# Patient Record
Sex: Male | Born: 1960 | Race: White | Hispanic: No | Marital: Married | Smoking: Current every day smoker
Health system: Southern US, Community
[De-identification: ages and names within clinical notes are randomized; demographics above are authoritative.]

## PROBLEM LIST (undated history)

## (undated) DIAGNOSIS — G4733 Obstructive sleep apnea (adult) (pediatric): Secondary | ICD-10-CM

## (undated) DIAGNOSIS — I10 Essential (primary) hypertension: Secondary | ICD-10-CM

## (undated) DIAGNOSIS — R42 Dizziness and giddiness: Secondary | ICD-10-CM

## (undated) DIAGNOSIS — G4730 Sleep apnea, unspecified: Secondary | ICD-10-CM

## (undated) DIAGNOSIS — R002 Palpitations: Secondary | ICD-10-CM

## (undated) DIAGNOSIS — E785 Hyperlipidemia, unspecified: Secondary | ICD-10-CM

## (undated) DIAGNOSIS — T7840XA Allergy, unspecified, initial encounter: Secondary | ICD-10-CM

## (undated) DIAGNOSIS — I493 Ventricular premature depolarization: Secondary | ICD-10-CM

## (undated) DIAGNOSIS — M1990 Unspecified osteoarthritis, unspecified site: Secondary | ICD-10-CM

## (undated) HISTORY — DX: Hyperlipidemia, unspecified: E78.5

## (undated) HISTORY — DX: Essential (primary) hypertension: I10

## (undated) HISTORY — DX: Sleep apnea, unspecified: G47.30

## (undated) HISTORY — DX: Dizziness and giddiness: R42

## (undated) HISTORY — DX: Ventricular premature depolarization: I49.3

## (undated) HISTORY — DX: Obstructive sleep apnea (adult) (pediatric): G47.33

## (undated) HISTORY — DX: Allergy, unspecified, initial encounter: T78.40XA

---

## 2003-04-01 HISTORY — PX: ELBOW SURGERY: SHX618

## 2005-03-31 HISTORY — PX: ROTATOR CUFF REPAIR: SHX139

## 2018-05-03 ENCOUNTER — Emergency Department (HOSPITAL_COMMUNITY)
Admission: EM | Admit: 2018-05-03 | Discharge: 2018-05-03 | Disposition: A | Discharge status: Home / Self Care | Payer: Managed Care, Other (non HMO) | Attending: Emergency Medicine | Admitting: Emergency Medicine

## 2018-05-03 ENCOUNTER — Other Ambulatory Visit: Payer: Self-pay

## 2018-05-03 ENCOUNTER — Emergency Department (HOSPITAL_COMMUNITY): Payer: Managed Care, Other (non HMO)

## 2018-05-03 ENCOUNTER — Encounter (HOSPITAL_COMMUNITY): Payer: Self-pay

## 2018-05-03 DIAGNOSIS — F17210 Nicotine dependence, cigarettes, uncomplicated: Secondary | ICD-10-CM | POA: Diagnosis not present

## 2018-05-03 DIAGNOSIS — R42 Dizziness and giddiness: Secondary | ICD-10-CM | POA: Diagnosis not present

## 2018-05-03 LAB — MAGNESIUM: Magnesium: 2.2 mg/dL (ref 1.7–2.4)

## 2018-05-03 LAB — COMPREHENSIVE METABOLIC PANEL
ALBUMIN: 4.6 g/dL (ref 3.5–5.0)
ALT: 39 U/L (ref 0–44)
AST: 35 U/L (ref 15–41)
Alkaline Phosphatase: 61 U/L (ref 38–126)
Anion gap: 13 (ref 5–15)
BUN: 16 mg/dL (ref 6–20)
CO2: 25 mmol/L (ref 22–32)
Calcium: 9.5 mg/dL (ref 8.9–10.3)
Chloride: 104 mmol/L (ref 98–111)
Creatinine, Ser: 1 mg/dL (ref 0.61–1.24)
GFR calc Af Amer: >60 mL/min (ref >60)
GFR calc non Af Amer: >60 mL/min (ref >60)
GLUCOSE: 144 mg/dL — AB (ref 70–99)
Potassium: 4.1 mmol/L (ref 3.5–5.1)
Sodium: 142 mmol/L (ref 135–145)
Total Bilirubin: 0.9 mg/dL (ref 0.3–1.2)
Total Protein: 7.6 g/dL (ref 6.5–8.1)

## 2018-05-03 LAB — ETHANOL: Alcohol, Ethyl (B): <10 mg/dL (ref <10)

## 2018-05-03 LAB — CBC WITH DIFFERENTIAL/PLATELET
Abs Immature Granulocytes: 0.02 10*3/uL (ref 0.00–0.07)
BASOS ABS: 0 10*3/uL (ref 0.0–0.1)
Basophils Relative: 1 %
Eosinophils Absolute: 0 10*3/uL (ref 0.0–0.5)
Eosinophils Relative: 1 %
HCT: 47.1 % (ref 39.0–52.0)
Hemoglobin: 15.3 g/dL (ref 13.0–17.0)
Immature Granulocytes: 1 %
Lymphocytes Relative: 16 %
Lymphs Abs: 0.6 10*3/uL — ABNORMAL LOW (ref 0.7–4.0)
MCH: 31.4 pg (ref 26.0–34.0)
MCHC: 32.5 g/dL (ref 30.0–36.0)
MCV: 96.7 fL (ref 80.0–100.0)
Monocytes Absolute: 0.5 10*3/uL (ref 0.1–1.0)
Monocytes Relative: 13 %
NEUTROS PCT: 68 %
Neutro Abs: 2.4 10*3/uL (ref 1.7–7.7)
Platelets: 170 10*3/uL (ref 150–400)
RBC: 4.87 MIL/uL (ref 4.22–5.81)
RDW: 12.5 % (ref 11.5–15.5)
WBC: 3.5 10*3/uL — AB (ref 4.0–10.5)
nRBC: 0 % (ref 0.0–0.2)

## 2018-05-03 LAB — I-STAT TROPONIN, ED
Troponin i, poc: 0.01 ng/mL (ref 0.00–0.08)
Troponin i, poc: 0 ng/mL (ref 0.00–0.08)

## 2018-05-03 MED ORDER — SODIUM CHLORIDE 0.9 % IV BOLUS
500.0000 mL | Freq: Once | INTRAVENOUS | Status: AC
  Administered 2018-05-03: 500 mL via INTRAVENOUS

## 2018-05-03 MED ORDER — SODIUM CHLORIDE 0.9 % IV BOLUS
1000.0000 mL | Freq: Once | INTRAVENOUS | Status: AC
  Administered 2018-05-03: 1000 mL via INTRAVENOUS

## 2018-05-03 NOTE — ED Notes (Signed)
Patient verbalizes understanding of discharge instructions. Opportunity for questioning and answers were provided. Armband removed by staff, pt discharged from ED.  

## 2018-05-03 NOTE — ED Notes (Signed)
Got patient undress into a gown on the monitor did ekg shown to Dr Francia Greaves patient is resting with call bell in reach

## 2018-05-03 NOTE — ED Provider Notes (Signed)
[facility] EMERGENCY DEPARTMENT Provider Note   CSN: [id number] Arrival date & time: 05/03/18  7106     History   Chief Complaint Chief Complaint  Patient presents with  . Near Syncope    HPI Bryce King is a 58 y.o. male.  58 year old male with prior medical history as detailed below presents for evaluation of lightheadedness and palpitations.  Patient reports onset of palpitations and lightheadedness after shoveling a lot of gravel yesterday morning.  Patient reports persistent symptoms over the course of yesterday and this morning.  He denies chest pain.  He does report one event yesterday where he thought he might pass out while he was sitting in a desk.  He has not actually had a syncopal event.  He denies associated shortness of breath or nausea.  He denies fever.  He denies known prior cardiac history.  Patient does have history of hypertension, hyperlipidemia, tobacco use, and ETOH consumption.  He currently does not have primary care.  He is not currently taking any medications.  The history is provided by the patient and medical records.  Palpitations  Palpitations quality:  Regular Onset quality:  Sudden Duration:  1 day Timing:  Constant Progression:  Waxing and waning Chronicity:  New Relieved by:  Nothing Worsened by:  Nothing Ineffective treatments:  None tried Associated symptoms: malaise/fatigue and near-syncope   Associated symptoms: no chest pain, no shortness of breath, no syncope and no vomiting     History reviewed. No pertinent past medical history.  There are no active problems to display for this patient.   History reviewed. No pertinent surgical history.      Home Medications    Prior to Admission medications   Not on File    Family History History reviewed. No pertinent family history.  Social History Social History   Tobacco Use  . Smoking status: Current Every Day Smoker    Packs/day: 1.00    Types:  Cigarettes  Substance Use Topics  . Alcohol use: Yes    Comment: 4 drinks per night 5 days a week  . Drug use: Never     Allergies   Patient has no known allergies.   Review of Systems Review of Systems  Constitutional: Positive for malaise/fatigue.  Respiratory: Negative for shortness of breath.   Cardiovascular: Positive for palpitations and near-syncope. Negative for chest pain and syncope.  Gastrointestinal: Negative for vomiting.  All other systems reviewed and are negative.    Physical Exam Updated Vital Signs BP (!) 179/93 (BP Location: Right Arm)   Pulse 82   Temp 97.9 F (36.6 C) (Oral)   Resp 14   Ht 6' (1.829 m)   Wt 104.3 kg   SpO2 100%   BMI 31.19 kg/m   Physical Exam Vitals signs and nursing note reviewed.  Constitutional:      General: He is not in acute distress.    Appearance: Normal appearance. He is well-developed.  HENT:     Head: Normocephalic and atraumatic.  Eyes:     Conjunctiva/sclera: Conjunctivae normal.     Pupils: Pupils are equal, round, and reactive to light.  Neck:     Musculoskeletal: Normal range of motion and neck supple.  Cardiovascular:     Rate and Rhythm: Normal rate and regular rhythm.     Heart sounds: Normal heart sounds.  Pulmonary:     Effort: Pulmonary effort is normal. No respiratory distress.     Breath sounds: Normal breath sounds.  Abdominal:     General: There is no distension.     Palpations: Abdomen is soft.     Tenderness: There is no abdominal tenderness.  Musculoskeletal: Normal range of motion.        General: No deformity.  Skin:    General: Skin is warm and dry.  Neurological:     Mental Status: He is alert and oriented to person, place, and time.      ED Treatments / Results  Labs (all labs ordered are listed, but only abnormal results are displayed) Labs Reviewed  CBC WITH DIFFERENTIAL/PLATELET - Abnormal; Notable for the following components:      Result Value   WBC 3.5 (*)    Lymphs  Abs 0.6 (*)    All other components within normal limits  COMPREHENSIVE METABOLIC PANEL - Abnormal; Notable for the following components:   Glucose, Bld 144 (*)    All other components within normal limits  ETHANOL  MAGNESIUM  I-STAT TROPONIN, ED  I-STAT TROPONIN, ED    EKG EKG Interpretation  Date/Time:  Monday May 03 2018 09:01:53 EST Ventricular Rate:  91 PR Interval:    QRS Duration: 102 QT Interval:  351 QTC Calculation: 432 R Axis:   -34 Text Interpretation:  Sinus rhythm Left axis deviation Confirmed by Dene Gentry [phone]) on 05/03/2018 9:05:21 AM   Radiology No results found.  Procedures Procedures (including critical care time)  Medications Ordered in ED Medications - No data to display   Initial Impression / Assessment and Plan / ED Course  I have reviewed the triage vital signs and the nursing notes.  Pertinent labs & imaging results that were available during my care of the patient were reviewed by me and considered in my medical decision making (see chart for details).     MDM  Screen complete  Patient is presenting for evaluation of reported lightheadedness and dizziness.  Patient's symptoms seem to be more orthostatic than not.  Patient without evidence of acute cardiac ischemia on EKG.  Troponin x2 is negative.  Other screening labs are without significant abnormality.  Patient does feel improved following IV fluids.  He desires discharge home.  Importance of close follow-up was stressed.  Strict return precautions given and understood.   Final Clinical Impressions(s) / ED Diagnoses   Final diagnoses:  [place]    ED Discharge Orders    None       Valarie Merino, MD 05/03/18 1435

## 2018-05-03 NOTE — Discharge Instructions (Addendum)
Return for any problem.  Follow-up with your regular care provider as instructed.  Plenty of fluids.

## 2018-05-03 NOTE — ED Notes (Signed)
Pt ambulated in hall to bathroom without assistance.

## 2018-05-03 NOTE — ED Triage Notes (Addendum)
Pt from home with complaint of lightheadedness, palpitations, heart racing, and 1 near syncopal episode since shoveling gravel yesterday morning. Denies chest pain. VSS.

## 2018-05-03 NOTE — ED Notes (Signed)
Walked to the bathroom patient did well

## 2018-05-05 ENCOUNTER — Encounter: Payer: Self-pay | Admitting: Family Medicine

## 2018-05-05 ENCOUNTER — Ambulatory Visit (INDEPENDENT_AMBULATORY_CARE_PROVIDER_SITE_OTHER): Payer: Managed Care, Other (non HMO) | Admitting: Family Medicine

## 2018-05-05 VITALS — BP 166/98 | HR 76 | Temp 98.2°F | Ht 72.0 in | Wt 227.0 lb

## 2018-05-05 DIAGNOSIS — Z23 Encounter for immunization: Secondary | ICD-10-CM

## 2018-05-05 DIAGNOSIS — R7309 Other abnormal glucose: Secondary | ICD-10-CM

## 2018-05-05 DIAGNOSIS — E785 Hyperlipidemia, unspecified: Secondary | ICD-10-CM | POA: Insufficient documentation

## 2018-05-05 DIAGNOSIS — I10 Essential (primary) hypertension: Secondary | ICD-10-CM

## 2018-05-05 DIAGNOSIS — F17200 Nicotine dependence, unspecified, uncomplicated: Secondary | ICD-10-CM | POA: Insufficient documentation

## 2018-05-05 DIAGNOSIS — F17210 Nicotine dependence, cigarettes, uncomplicated: Secondary | ICD-10-CM

## 2018-05-05 LAB — LIPID PANEL
Cholesterol: 244 mg/dL — ABNORMAL HIGH (ref 0–200)
HDL: 36.2 mg/dL — ABNORMAL LOW (ref >39.00)
LDL Cholesterol: 178 mg/dL — ABNORMAL HIGH (ref 0–99)
NonHDL: 207.54
Total CHOL/HDL Ratio: 7
Triglycerides: 148 mg/dL (ref 0.0–149.0)
VLDL: 29.6 mg/dL (ref 0.0–40.0)

## 2018-05-05 LAB — HEMOGLOBIN A1C: Hgb A1c MFr Bld: 5.7 % (ref 4.6–6.5)

## 2018-05-05 LAB — TSH: TSH: 1.61 u[IU]/mL (ref 0.35–4.50)

## 2018-05-05 MED ORDER — BUPROPION HCL ER (SR) 150 MG PO TB12: 150.0000 mg | ORAL_TABLET | Freq: Two times a day (BID) | ORAL | 0 refills | Status: DC

## 2018-05-05 MED ORDER — NICOTINE 14 MG/24HR TD PT24: 14.0000 mg | MEDICATED_PATCH | Freq: Every day | TRANSDERMAL | 0 refills | Status: DC

## 2018-05-05 MED ORDER — LISINOPRIL-HYDROCHLOROTHIAZIDE 20-12.5 MG PO TABS: 1.0000 | ORAL_TABLET | Freq: Every day | ORAL | 1 refills | Status: DC

## 2018-05-05 MED ORDER — MOMETASONE FUROATE 0.1 % EX CREA: 1.0000 "application " | TOPICAL_CREAM | Freq: Every day | CUTANEOUS | 0 refills | Status: DC

## 2018-05-05 MED ORDER — NICOTINE 7 MG/24HR TD PT24: 7.0000 mg | MEDICATED_PATCH | Freq: Every day | TRANSDERMAL | 0 refills | Status: DC

## 2018-05-05 MED ORDER — NICOTINE 21 MG/24HR TD PT24: 21.0000 mg | MEDICATED_PATCH | Freq: Every day | TRANSDERMAL | 0 refills | Status: DC

## 2018-05-05 NOTE — Assessment & Plan Note (Signed)
-  BP elevated today -Restart lisinopril-hctz at 20-12.5mg  initially. -Continue use of CPAP and discussed smoking cessation.

## 2018-05-05 NOTE — Patient Instructions (Addendum)
Great to meet you! Start bupropion  Nicotine patches: 21mg  x 1 week 14mg  x1 week 7mg  x2 weeks  We'll be in touch with lab results  See me again in 1 month.    Hypertension Hypertension, commonly called high blood pressure, is when the force of blood pumping through the arteries is too strong. The arteries are the blood vessels that carry blood from the heart throughout the body. Hypertension forces the heart to work harder to pump blood and may cause arteries to become narrow or stiff. Having untreated or uncontrolled hypertension can cause heart attacks, strokes, kidney disease, and other problems. A blood pressure reading consists of a higher number over a lower number. Ideally, your blood pressure should be below 120/80. The first ("top") number is called the systolic pressure. It is a measure of the pressure in your arteries as your heart beats. The second ("bottom") number is called the diastolic pressure. It is a measure of the pressure in your arteries as the heart relaxes. What are the causes? The cause of this condition is not known. What increases the risk? Some risk factors for high blood pressure are under your control. Others are not. Factors you can change  Smoking.  Having type 2 diabetes mellitus, high cholesterol, or both.  Not getting enough exercise or physical activity.  Being overweight.  Having too much fat, sugar, calories, or salt (sodium) in your diet.  Drinking too much alcohol. Factors that are difficult or impossible to change  Having chronic kidney disease.  Having a family history of high blood pressure.  Age. Risk increases with age.  Race. You may be at higher risk if you are African-American.  Gender. Men are at higher risk than women before age 78. After age 78, women are at higher risk than men.  Having obstructive sleep apnea.  Stress. What are the signs or symptoms? Extremely high blood pressure (hypertensive crisis) may  cause:  Headache.  Anxiety.  Shortness of breath.  Nosebleed.  Nausea and vomiting.  Severe chest pain.  Jerky movements you cannot control (seizures). How is this diagnosed? This condition is diagnosed by measuring your blood pressure while you are seated, with your arm resting on a surface. The cuff of the blood pressure monitor will be placed directly against the skin of your upper arm at the level of your heart. It should be measured at least twice using the same arm. Certain conditions can cause a difference in blood pressure between your right and left arms. Certain factors can cause blood pressure readings to be lower or higher than normal (elevated) for a short period of time:  When your blood pressure is higher when you are in a health care provider's office than when you are at home, this is called white coat hypertension. Most people with this condition do not need medicines.  When your blood pressure is higher at home than when you are in a health care provider's office, this is called masked hypertension. Most people with this condition may need medicines to control blood pressure. If you have a high blood pressure reading during one visit or you have normal blood pressure with other risk factors:  You may be asked to return on a different day to have your blood pressure checked again.  You may be asked to monitor your blood pressure at home for 1 week or longer. If you are diagnosed with hypertension, you may have other blood or imaging tests to help your health care  provider understand your overall risk for other conditions. How is this treated? This condition is treated by making healthy lifestyle changes, such as eating healthy foods, exercising more, and reducing your alcohol intake. Your health care provider may prescribe medicine if lifestyle changes are not enough to get your blood pressure under control, and if:  Your systolic blood pressure is above 130.  Your  diastolic blood pressure is above 80. Your personal target blood pressure may vary depending on your medical conditions, your age, and other factors. Follow these instructions at home: Eating and drinking   Eat a diet that is high in fiber and potassium, and low in sodium, added sugar, and fat. An example eating plan is called the DASH (Dietary Approaches to Stop Hypertension) diet. To eat this way: ? Eat plenty of fresh fruits and vegetables. Try to fill half of your plate at each meal with fruits and vegetables. ? Eat whole grains, such as whole wheat pasta, brown rice, or whole grain bread. Fill about one quarter of your plate with whole grains. ? Eat or drink low-fat dairy products, such as skim milk or low-fat yogurt. ? Avoid fatty cuts of meat, processed or cured meats, and poultry with skin. Fill about one quarter of your plate with lean proteins, such as fish, chicken without skin, beans, eggs, and tofu. ? Avoid premade and processed foods. These tend to be higher in sodium, added sugar, and fat.  Reduce your daily sodium intake. Most people with hypertension should eat less than 1,500 mg of sodium a day.  Limit alcohol intake to no more than 1 drink a day for nonpregnant women and 2 drinks a day for men. One drink equals 12 oz of beer, 5 oz of wine, or 1 oz of hard liquor. Lifestyle   Work with your health care provider to maintain a healthy body weight or to lose weight. Ask what an ideal weight is for you.  Get at least 30 minutes of exercise that causes your heart to beat faster (aerobic exercise) most days of the week. Activities may include walking, swimming, or biking.  Include exercise to strengthen your muscles (resistance exercise), such as pilates or lifting weights, as part of your weekly exercise routine. Try to do these types of exercises for 30 minutes at least 3 days a week.  Do not use any products that contain nicotine or tobacco, such as cigarettes and  e-cigarettes. If you need help quitting, ask your health care provider.  Monitor your blood pressure at home as told by your health care provider.  Keep all follow-up visits as told by your health care provider. This is important. Medicines  Take over-the-counter and prescription medicines only as told by your health care provider. Follow directions carefully. Blood pressure medicines must be taken as prescribed.  Do not skip doses of blood pressure medicine. Doing this puts you at risk for problems and can make the medicine less effective.  Ask your health care provider about side effects or reactions to medicines that you should watch for. Contact a health care provider if:  You think you are having a reaction to a medicine you are taking.  You have headaches that keep coming back (recurring).  You feel dizzy.  You have swelling in your ankles.  You have trouble with your vision. Get help right away if:  You develop a severe headache or confusion.  You have unusual weakness or numbness.  You feel faint.  You have severe pain  in your chest or abdomen.  You vomit repeatedly.  You have trouble breathing. Summary  Hypertension is when the force of blood pumping through your arteries is too strong. If this condition is not controlled, it may put you at risk for serious complications.  Your personal target blood pressure may vary depending on your medical conditions, your age, and other factors. For most people, a normal blood pressure is less than 120/80.  Hypertension is treated with lifestyle changes, medicines, or a combination of both. Lifestyle changes include weight loss, eating a healthy, low-sodium diet, exercising more, and limiting alcohol. This information is not intended to replace advice given to you by your health care provider. Make sure you discuss any questions you have with your health care provider. Document Released: 03/17/2005 Document Revised: 02/13/2016  Document Reviewed: 02/13/2016 Elsevier Interactive Patient Education  2019 [employer].

## 2018-05-05 NOTE — Progress Notes (Signed)
Bryce King - 58 y.o. male MRN [id number]  Date of birth: 1960/05/19  Subjective Chief Complaint  Patient presents with  . Annual Exam    Wants to discuss ongoing dizziness and headaches    HPI Amar Sippel is a 58 y.o. male with history of HTN, HLD, OSA and nicotine dependence here today for initial visit.  He was seen in ER recently for feeling of dizziness and pre-syncopal episode.  Had negative cardiac enzymes and no significant EKG findings.  Given fluid and felt better.  He reports that he feels ok at this time.   -HTN:  Previous treatment with lisinopril-hctz 40/25 qam and lisinopril 40mg  qpm.  He reports that he did well with this and denies side effects related to this medication.  He does also have OSA and reports compliance with CPAP.  He denies chest pain, shortness of breath, palpitations or headache.   -HLD:  He reports trying several statins in the past with myalgias and was eventually placed on Wellchol.  He did well with this and denies side effects from this medication.   -Nicotine dependence:  Currently smoking about 1ppd.  Was able to quit in the past with bupropion and patches from 2001-2016.  He would like to try these again and is motivated to quit.    ROS:  A comprehensive ROS was completed and negative except as noted per HPI  Allergies  Allergen Reactions  . Statins Other (See Comments)    Muscle aches    Past Medical History:  Diagnosis Date  . Hyperlipidemia   . Hypertension   . OSA (obstructive sleep apnea)    Uses CPAP    Past Surgical History:  Procedure Laterality Date  . ELBOW SURGERY Right 2005  . ROTATOR CUFF REPAIR Right 2007    Social History   Socioeconomic History  . Marital status: Married    Spouse name: Not on file  . Number of children: Not on file  . Years of education: Not on file  . Highest education level: Not on file  Occupational History  . Not on file  Social Needs  . Financial resource strain: Not on  file  . Food insecurity:    Worry: Not on file    Inability: Not on file  . Transportation needs:    Medical: Not on file    Non-medical: Not on file  Tobacco Use  . Smoking status: Current Every Day Smoker    Packs/day: 1.00    Types: Cigarettes  . Smokeless tobacco: Never Used  Substance and Sexual Activity  . Alcohol use: Yes    Comment: 4 drinks per night 5 days a week  . Drug use: Never  . Sexual activity: Not Currently  Lifestyle  . Physical activity:    Days per week: Not on file    Minutes per session: Not on file  . Stress: Not on file  Relationships  . Social connections:    Talks on phone: Not on file    Gets together: Not on file    Attends religious service: Not on file    Active member of club or organization: Not on file    Attends meetings of clubs or organizations: Not on file    Relationship status: Not on file  Other Topics Concern  . Not on file  Social History Narrative  . Not on file    Family History  Problem Relation Age of Onset  . Cancer Father   .  Depression Father   . Diabetes Father   . COPD Maternal Grandmother   . Heart attack Maternal Grandfather   . Heart attack Paternal Grandfather     Health Maintenance  Topic Date Due  . Hepatitis C Screening  May 23, 1960  . HIV Screening  07/06/1976  . TETANUS/TDAP  07/06/1980  . COLONOSCOPY  07/07/2011  . INFLUENZA VACCINE  10/29/2017    ----------------------------------------------------------------------------------------------------------------------------------------------------------------------------------------------------------------- Physical Exam BP (!) 166/98   Pulse 76   Temp 98.2 F (36.8 C) (Oral)   Ht 6' (1.829 m)   Wt 227 lb (103 kg)   SpO2 96%   BMI 30.79 kg/m   Physical Exam Constitutional:      Appearance: Normal appearance.  HENT:     Head: Normocephalic and atraumatic.     Mouth/Throat:     Mouth: Mucous membranes are moist.  Eyes:     General: No  scleral icterus. Neck:     Comments: No carotid bruit  Cardiovascular:     Rate and Rhythm: Normal rate and regular rhythm.  Pulmonary:     Effort: Pulmonary effort is normal.     Breath sounds: Normal breath sounds.  Skin:    General: Skin is warm and dry.  Neurological:     General: No focal deficit present.     Mental Status: He is alert.  Psychiatric:        Mood and Affect: Mood normal.        Behavior: Behavior normal.     ------------------------------------------------------------------------------------------------------------------------------------------------------------------------------------------------------------------- Assessment and Plan  Essential hypertension -BP elevated today -Restart lisinopril-hctz at 20-12.5mg  initially. -Continue use of CPAP and discussed smoking cessation.   Hyperlipidemia -Check lipid panel today.   Elevated glucose -Glucose on recent lab work elevated, check a1c today.   Nicotine dependence Counseling provided on smoking cessation today and discussed medication to help with this.  He is motivated to quit.  He had good success with bupropion and patches previously, will start this again.  Reassess at f/u in 4 weeks.   >10 minutes spent counseling patient regarding smoking cessation as outlined above.

## 2018-05-05 NOTE — Assessment & Plan Note (Signed)
-  Glucose on recent lab work elevated, check a1c today.

## 2018-05-05 NOTE — Assessment & Plan Note (Signed)
Check lipid panel today 

## 2018-05-05 NOTE — Assessment & Plan Note (Signed)
Counseling provided on smoking cessation today and discussed medication to help with this.  He is motivated to quit.  He had good success with bupropion and patches previously, will start this again.  Reassess at f/u in 4 weeks.   >10 minutes spent counseling patient regarding smoking cessation as outlined above.

## 2018-05-13 ENCOUNTER — Other Ambulatory Visit: Payer: Self-pay | Admitting: Family Medicine

## 2018-05-13 ENCOUNTER — Other Ambulatory Visit: Payer: Self-pay

## 2018-05-13 DIAGNOSIS — E785 Hyperlipidemia, unspecified: Secondary | ICD-10-CM

## 2018-05-13 MED ORDER — PRAVASTATIN SODIUM 40 MG PO TABS: 40.0000 mg | ORAL_TABLET | Freq: Every day | ORAL | 1 refills | Status: DC

## 2018-05-17 ENCOUNTER — Encounter (HOSPITAL_BASED_OUTPATIENT_CLINIC_OR_DEPARTMENT_OTHER): Payer: Self-pay

## 2018-05-17 ENCOUNTER — Observation Stay (HOSPITAL_BASED_OUTPATIENT_CLINIC_OR_DEPARTMENT_OTHER)
Admission: EM | Admit: 2018-05-17 | Discharge: 2018-05-18 | Disposition: A | Discharge status: Home / Self Care | Payer: Managed Care, Other (non HMO) | Attending: Internal Medicine | Admitting: Internal Medicine

## 2018-05-17 ENCOUNTER — Other Ambulatory Visit: Payer: Self-pay

## 2018-05-17 ENCOUNTER — Emergency Department (HOSPITAL_BASED_OUTPATIENT_CLINIC_OR_DEPARTMENT_OTHER): Payer: Managed Care, Other (non HMO)

## 2018-05-17 ENCOUNTER — Ambulatory Visit: Payer: Self-pay | Admitting: *Deleted

## 2018-05-17 DIAGNOSIS — R002 Palpitations: Secondary | ICD-10-CM | POA: Diagnosis present

## 2018-05-17 DIAGNOSIS — Z79899 Other long term (current) drug therapy: Secondary | ICD-10-CM | POA: Insufficient documentation

## 2018-05-17 DIAGNOSIS — Z8249 Family history of ischemic heart disease and other diseases of the circulatory system: Secondary | ICD-10-CM | POA: Diagnosis not present

## 2018-05-17 DIAGNOSIS — R072 Precordial pain: Secondary | ICD-10-CM | POA: Diagnosis not present

## 2018-05-17 DIAGNOSIS — I10 Essential (primary) hypertension: Secondary | ICD-10-CM | POA: Diagnosis not present

## 2018-05-17 DIAGNOSIS — G4733 Obstructive sleep apnea (adult) (pediatric): Secondary | ICD-10-CM | POA: Insufficient documentation

## 2018-05-17 DIAGNOSIS — R55 Syncope and collapse: Secondary | ICD-10-CM | POA: Insufficient documentation

## 2018-05-17 DIAGNOSIS — E785 Hyperlipidemia, unspecified: Secondary | ICD-10-CM | POA: Diagnosis not present

## 2018-05-17 DIAGNOSIS — R740 Nonspecific elevation of levels of transaminase and lactic acid dehydrogenase [LDH]: Secondary | ICD-10-CM | POA: Insufficient documentation

## 2018-05-17 DIAGNOSIS — Z888 Allergy status to other drugs, medicaments and biological substances status: Secondary | ICD-10-CM | POA: Insufficient documentation

## 2018-05-17 DIAGNOSIS — Z6831 Body mass index (BMI) 31.0-31.9, adult: Secondary | ICD-10-CM | POA: Diagnosis not present

## 2018-05-17 DIAGNOSIS — F17200 Nicotine dependence, unspecified, uncomplicated: Secondary | ICD-10-CM | POA: Diagnosis present

## 2018-05-17 DIAGNOSIS — I493 Ventricular premature depolarization: Secondary | ICD-10-CM | POA: Diagnosis present

## 2018-05-17 DIAGNOSIS — R739 Hyperglycemia, unspecified: Secondary | ICD-10-CM | POA: Insufficient documentation

## 2018-05-17 DIAGNOSIS — F17210 Nicotine dependence, cigarettes, uncomplicated: Secondary | ICD-10-CM | POA: Insufficient documentation

## 2018-05-17 DIAGNOSIS — R0789 Other chest pain: Secondary | ICD-10-CM | POA: Diagnosis not present

## 2018-05-17 DIAGNOSIS — F1010 Alcohol abuse, uncomplicated: Secondary | ICD-10-CM | POA: Insufficient documentation

## 2018-05-17 DIAGNOSIS — E669 Obesity, unspecified: Secondary | ICD-10-CM | POA: Insufficient documentation

## 2018-05-17 DIAGNOSIS — R079 Chest pain, unspecified: Secondary | ICD-10-CM | POA: Diagnosis present

## 2018-05-17 LAB — BASIC METABOLIC PANEL
Anion gap: 9 (ref 5–15)
BUN: 15 mg/dL (ref 6–20)
CO2: 26 mmol/L (ref 22–32)
Calcium: 9.6 mg/dL (ref 8.9–10.3)
Chloride: 103 mmol/L (ref 98–111)
Creatinine, Ser: 0.91 mg/dL (ref 0.61–1.24)
GFR calc Af Amer: >60 mL/min (ref >60)
GFR calc non Af Amer: >60 mL/min (ref >60)
Glucose, Bld: 107 mg/dL — ABNORMAL HIGH (ref 70–99)
Potassium: 4.2 mmol/L (ref 3.5–5.1)
Sodium: 138 mmol/L (ref 135–145)

## 2018-05-17 LAB — URINALYSIS, ROUTINE W REFLEX MICROSCOPIC
Bilirubin Urine: NEGATIVE
Glucose, UA: NEGATIVE mg/dL
Hgb urine dipstick: NEGATIVE
Ketones, ur: NEGATIVE mg/dL
Leukocytes,Ua: NEGATIVE
Nitrite: NEGATIVE
Protein, ur: NEGATIVE mg/dL
Specific Gravity, Urine: <1.005 — ABNORMAL LOW (ref 1.005–1.030)
pH: 6.5 (ref 5.0–8.0)

## 2018-05-17 LAB — CBC
HCT: 48.1 % (ref 39.0–52.0)
Hemoglobin: 16 g/dL (ref 13.0–17.0)
MCH: 31.9 pg (ref 26.0–34.0)
MCHC: 33.3 g/dL (ref 30.0–36.0)
MCV: 95.8 fL (ref 80.0–100.0)
Platelets: 217 10*3/uL (ref 150–400)
RBC: 5.02 MIL/uL (ref 4.22–5.81)
RDW: 11.9 % (ref 11.5–15.5)
WBC: 5.5 10*3/uL (ref 4.0–10.5)
nRBC: 0 % (ref 0.0–0.2)

## 2018-05-17 LAB — HEPATIC FUNCTION PANEL
ALT: 52 U/L — ABNORMAL HIGH (ref 0–44)
AST: 35 U/L (ref 15–41)
Albumin: 4.8 g/dL (ref 3.5–5.0)
Alkaline Phosphatase: 82 U/L (ref 38–126)
BILIRUBIN DIRECT: 0.1 mg/dL (ref 0.0–0.2)
Indirect Bilirubin: 0.6 mg/dL (ref 0.3–0.9)
Total Bilirubin: 0.7 mg/dL (ref 0.3–1.2)
Total Protein: 8.1 g/dL (ref 6.5–8.1)

## 2018-05-17 LAB — CBG MONITORING, ED: Glucose-Capillary: 94 mg/dL (ref 70–99)

## 2018-05-17 LAB — LIPASE, BLOOD: Lipase: 44 U/L (ref 11–51)

## 2018-05-17 LAB — TROPONIN I: Troponin I: <0.03 ng/mL (ref <0.03)

## 2018-05-17 MED ORDER — ENOXAPARIN SODIUM 40 MG/0.4ML ~~LOC~~ SOLN
40.0000 mg | SUBCUTANEOUS | Status: DC
  Filled 2018-05-17: qty 0.4

## 2018-05-17 MED ORDER — NITROGLYCERIN 0.4 MG SL SUBL
0.4000 mg | SUBLINGUAL_TABLET | SUBLINGUAL | Status: DC | PRN
  Filled 2018-05-17: qty 1

## 2018-05-17 MED ORDER — BUPROPION HCL ER (SR) 150 MG PO TB12
150.0000 mg | ORAL_TABLET | Freq: Two times a day (BID) | ORAL | Status: DC
  Administered 2018-05-17 – 2018-05-18 (×2): 150 mg via ORAL
  Filled 2018-05-17 (×3): qty 1

## 2018-05-17 MED ORDER — ASPIRIN 325 MG PO TABS
325.0000 mg | ORAL_TABLET | Freq: Once | ORAL | Status: AC
  Administered 2018-05-17: 325 mg via ORAL
  Filled 2018-05-17: qty 1

## 2018-05-17 MED ORDER — ACETAMINOPHEN 325 MG PO TABS: 650.0000 mg | ORAL_TABLET | ORAL | Status: DC | PRN

## 2018-05-17 MED ORDER — NICOTINE 21 MG/24HR TD PT24
21.0000 mg | MEDICATED_PATCH | Freq: Every day | TRANSDERMAL | Status: DC
  Administered 2018-05-18: 21 mg via TRANSDERMAL
  Filled 2018-05-17: qty 1

## 2018-05-17 MED ORDER — PANTOPRAZOLE SODIUM 40 MG IV SOLR
40.0000 mg | Freq: Once | INTRAVENOUS | Status: AC
  Administered 2018-05-17: 40 mg via INTRAVENOUS
  Filled 2018-05-17: qty 40

## 2018-05-17 MED ORDER — PRAVASTATIN SODIUM 40 MG PO TABS
40.0000 mg | ORAL_TABLET | Freq: Every day | ORAL | Status: DC
  Filled 2018-05-17: qty 1

## 2018-05-17 MED ORDER — ONDANSETRON HCL 4 MG/2ML IJ SOLN: 4.0000 mg | Freq: Four times a day (QID) | INTRAMUSCULAR | Status: DC | PRN

## 2018-05-17 MED ORDER — MORPHINE SULFATE (PF) 4 MG/ML IV SOLN
4.0000 mg | Freq: Once | INTRAVENOUS | Status: AC
  Administered 2018-05-17: 4 mg via INTRAVENOUS
  Filled 2018-05-17: qty 1

## 2018-05-17 NOTE — H&P (Signed)
History and Physical   Bryce King FWY:[id number] DOB: 05-Dec-1960 DOA: 05/17/2018  PCP: Luetta Nutting, DO  Chief Complaint: chest pain and dizziness  HPI: This is a 58 year old man with medical problems including hypertension, hyperlipidemia, former smoker, OSA who presents with recent development of chest pain and lightheadedness.  History is obtained via chart review, as well as discussion with the patient at the bedside.  Patient reports he has been statin intolerant in the past, but was successfully initiated on pravastatin about 1 week ago.  In the past he has had myalgias with other statins.  He also reports being started on ACE inhibitor as well as hydrochlorothiazide for his hypertension.  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  He reports quitting smoking 1 week prior to admission, currently on nicotine replacement as well as adjunctive bupropion.  He reports being active outdoors doing yard work.  He was at his desk when he developed sudden onset of lightheadedness, palpitations, diaphoresis as well as chest discomfort, this first episode occurred at about 2:30 PM.  Lasted for a few seconds before going away.  He reports there was a positional component.  A second episode developed about 15 minutes later, this lasted for 30 to 40 minutes.  This is what prompted him to call his primary care physician who recommended seeking medical attention in a higher acuity setting.  Patient reports daily alcohol intake, about 3-5 drinks of whiskey daily.  However, he reports he has never had withdrawal symptoms and he has cut back on his alcohol recently.  He denies any fevers, chills, syncope, seizures, hemoptysis, productive cough, dysuria.  The patient reports having similar symptoms in early February where he was evaluated in the emergency department, he reports receiving IV fluids and discharged home.  Has never undergone a cardiac stress test.  ED Course: In the  emergency department department vital signs remarkable for heart rate of 98, systolic blood pressure 277/41.  CBC unremarkable, CMP with mild hyperglycemia with a glucose of 107, mild ALT elevation of 52.  Troponin evaluated and was undetectable at first check at 1811.  The patient was given morphine as well as a PPI.  EKG revealed normal sinus rhythm, personally reviewed no overt ST segment elevations.  A chest x-ray was performed which did not reveal acute abnormality of the lungs.  Cardiology was consulted in the emergency department who recommended transfer to [facility] further work-up.  Hospital medicine was consulted for further management.    Review of Systems: A complete ROS was obtained; pertinent positives negatives are denoted in the HPI. Otherwise, all systems are negative.   Past Medical History:  Diagnosis Date  . Hyperlipidemia   . Hypertension   . OSA (obstructive sleep apnea)    Uses CPAP   Social History   Socioeconomic History  . Marital status: Married    Spouse name: Not on file  . Number of children: Not on file  . Years of education: Not on file  . Highest education level: Not on file  Occupational History  . Not on file  Social Needs  . Financial resource strain: Not on file  . Food insecurity:    Worry: Not on file    Inability: Not on file  . Transportation needs:    Medical: Not on file    Non-medical: Not on file  Tobacco Use  . Smoking status: Current Every Day Smoker    Packs/day: 1.00    Types: Cigarettes  .  Smokeless tobacco: Never Used  . Tobacco comment: quit 05/11/2018  Substance and Sexual Activity  . Alcohol use: Yes    Comment: weekly  . Drug use: Never  . Sexual activity: Not on file  Lifestyle  . Physical activity:    Days per week: Not on file    Minutes per session: Not on file  . Stress: Not on file  Relationships  . Social connections:    Talks on phone: Not on file    Gets together: Not on file    Attends religious service:  Not on file    Active member of club or organization: Not on file    Attends meetings of clubs or organizations: Not on file    Relationship status: Not on file  . Intimate partner violence:    Fear of current or ex partner: Not on file    Emotionally abused: Not on file    Physically abused: Not on file    Forced sexual activity: Not on file  Other Topics Concern  . Not on file  Social History Narrative  . Not on file   Family History  Problem Relation Age of Onset  . Cancer Father   . Depression Father   . Diabetes Father   . COPD Maternal Grandmother   . Heart attack Maternal Grandfather   . Heart attack Paternal Grandfather     Physical Exam: Vitals:   05/17/18 1640 05/17/18 1642 05/17/18 2022 05/17/18 2139  BP: (!) 140/91 124/83 (!) 145/92 (!) 161/93  Pulse: 84 89 74 65  Resp: (!) 23 14 13 15   Temp:    98 F (36.7 C)  TempSrc:    Oral  SpO2: 99% 97% 98% 98%  Weight:    100.3 kg  Height:    6' (1.829 m)   General: Appears calm and comfortable, well-appearing overweight/obese white man ENT: Grossly normal hearing, MMM. Cardiovascular: RRR. No M/R/G. No LE edema.  Respiratory: CTA bilaterally. No wheezes or crackles. Normal respiratory effort.  Breathing room air Abdomen: Soft, non-tender.  Mildly distended.  No guarding or rebound Skin: No rash or induration seen on limited exam.  Tattoos present. Musculoskeletal: Grossly normal tone BUE/BLE. Appropriate ROM.  Sits up without difficulty Psychiatric: Grossly normal mood and affect. Neurologic: Moves all extremities in coordinated fashion.  I have personally reviewed the following labs, culture data, and imaging studies.  Assessment/Plan:  #Chest pain Course: Occurred x 2 episodes on day of admission, the 2nd episode lasted 30-40 minutes; associated symptoms of diaphoresis, palpitations. Initial EKG with NSR and initial cardiac biomarker WNL. A/P: has cardiac risk factors with hyperlipidemia, recent smoking  cessation (within last week), HTN; differential includes cardiac ischemia vs other etiologies - however, doubt VTE given normal HR and RR, other considerations include other pulmonary, MSK, GI, etc etiologies.  Will provide ASA 324 mg as benefits outweigh burdens / risks. NPO after MN in event cardiac stress test indicated.  Will repeat troponin to ensure stability.  Continue statin. Consider TTE in AM. Telemetry.  #Other problems: -HTN: holding ACEi / HCTZ until re-assessment in AM pending clinical stability -Smoking, recent quitting: continue nicotine patch and adjunctive bupropion -OSA: CPAP qhs -ETOH use: 3-5 whiskey beverages on average daily, no hx of withdrawal -Mild ALT elevation: repeat in AM, if consistently elevated, consider additional w/u.  DVT prophylaxis: Subq Lovenox Code Status: full Disposition Plan: Anticipate D/C home as early as 05/18/2018 depending on clinical course Consults called:  Cardiology consulted by emergency medicine  team; however, would recommend touching base with their team in the AM to evaluate optimal ischemic evaluation Admission status: admit to hospital medicine service   Cheri Rous, MD [facility] [phone]  If 7PM-7AM, please contact night-coverage [email or web address] Password [email or web address]  This document was created using the aid of voice recognition / dication software.

## 2018-05-17 NOTE — ED Provider Notes (Signed)
[facility] EMERGENCY DEPARTMENT Provider Note   CSN: [id number] Arrival date & time: 05/17/18  1554    History   Chief Complaint Chief Complaint  Patient presents with  . Dizziness  . Near Syncope    HPI Bryce King is a 58 y.o. male.     HPI Patient has had episodes of developing left-sided chest pain.  He reports he starts to feel like his chest is expanding and full which is then subsequently followed by heart racing and feeling like he is getting close to passing out.  He did have an episode similar to this when he was seen in the emergency department about a week ago.  At that time it occurred a couple hours after he had been doing some pretty heavy physical labor.  These last 2 episodes have occurred at rest.  He reports however the pain becomes quite intense and slowly starts to resolve.  Denies has been sick with fever chills or cough.  He has not had abdominal pain or lower extremity swelling.  Never prior history of similar symptoms.  Family history for 2 grandparents with heart attack in their 72s. Past Medical History:  Diagnosis Date  . Hyperlipidemia   . Hypertension   . OSA (obstructive sleep apnea)    Uses CPAP    Patient Active Problem List   Diagnosis Date Noted  . Chest pressure 05/17/2018  . Essential hypertension 05/05/2018  . Hyperlipidemia 05/05/2018  . Elevated glucose 05/05/2018  . Nicotine dependence 05/05/2018    Past Surgical History:  Procedure Laterality Date  . ELBOW SURGERY Right 2005  . ROTATOR CUFF REPAIR Right 2007        Home Medications    Prior to Admission medications   Medication Sig Start Date End Date Taking? Authorizing Provider  buPROPion (WELLBUTRIN SR) 150 MG 12 hr tablet Take 1 tablet (150 mg total) by mouth 2 (two) times daily. Start 150mg  daily x3 days then increase to BID 05/05/18  Yes Luetta Nutting, DO  lisinopril-hydrochlorothiazide (ZESTORETIC) 20-12.5 MG tablet Take 1 tablet by mouth daily.  05/05/18  Yes Luetta Nutting, DO  Melatonin 3 MG TABS Take 3 mg by mouth at bedtime.   Yes [provider]  mometasone (ELOCON) 0.1 % cream Apply 1 application topically daily. 05/05/18  Yes Luetta Nutting, DO  nicotine (NICODERM CQ) 21 mg/24hr patch Place 1 patch (21 mg total) onto the skin daily. 05/05/18  Yes Luetta Nutting, DO  pravastatin (PRAVACHOL) 40 MG tablet Take 1 tablet (40 mg total) by mouth daily. 05/13/18  Yes Luetta Nutting, DO  Multiple Vitamin (MULTIVITAMIN WITH MINERALS) TABS tablet Take 1 tablet by mouth daily.    [provider]  nicotine (NICODERM CQ) 14 mg/24hr patch Place 1 patch (14 mg total) onto the skin daily. 05/05/18   Luetta Nutting, DO  nicotine (NICODERM CQ) 7 mg/24hr patch Place 1 patch (7 mg total) onto the skin daily. 05/05/18   Luetta Nutting, DO    Family History Family History  Problem Relation Age of Onset  . Cancer Father   . Depression Father   . Diabetes Father   . COPD Maternal Grandmother   . Heart attack Maternal Grandfather   . Heart attack Paternal Grandfather     Social History Social History   Tobacco Use  . Smoking status: Current Every Day Smoker    Packs/day: 1.00    Types: Cigarettes  . Smokeless tobacco: Never Used  . Tobacco comment: quit 05/11/2018  Substance Use Topics  . Alcohol use: Yes    Comment: weekly  . Drug use: Never     Allergies   Statins   Review of Systems Review of Systems 10 Systems reviewed and are negative for acute change except as noted in the HPI.   Physical Exam Updated Vital Signs BP 124/83   Pulse 89   Temp 97.8 F (36.6 C) (Oral)   Resp 14   Ht 6' (1.829 m)   Wt 104.3 kg   SpO2 97%   BMI 31.19 kg/m   Physical Exam Constitutional:      Appearance: He is well-developed.  HENT:     Head: Normocephalic and atraumatic.     Mouth/Throat:     Mouth: Mucous membranes are moist.     Pharynx: Oropharynx is clear.  Eyes:     Pupils: Pupils are equal, round, and reactive to  light.  Neck:     Musculoskeletal: Neck supple.  Cardiovascular:     Rate and Rhythm: Normal rate and regular rhythm.     Heart sounds: Normal heart sounds.  Pulmonary:     Effort: Pulmonary effort is normal.     Breath sounds: Normal breath sounds.  Abdominal:     General: Bowel sounds are normal. There is no distension.     Palpations: Abdomen is soft.     Tenderness: There is no abdominal tenderness.  Musculoskeletal: Normal range of motion.        General: No swelling or tenderness.  Skin:    General: Skin is warm and dry.  Neurological:     Mental Status: He is alert and oriented to person, place, and time.     GCS: GCS eye subscore is 4. GCS verbal subscore is 5. GCS motor subscore is 6.     Coordination: Coordination normal.      ED Treatments / Results  Labs (all labs ordered are listed, but only abnormal results are displayed) Labs Reviewed  BASIC METABOLIC PANEL - Abnormal; Notable for the following components:      Result Value   Glucose, Bld 107 (*)    All other components within normal limits  URINALYSIS, ROUTINE W REFLEX MICROSCOPIC - Abnormal; Notable for the following components:   Specific Gravity, Urine <1.005 (*)    All other components within normal limits  HEPATIC FUNCTION PANEL - Abnormal; Notable for the following components:   ALT 52 (*)    All other components within normal limits  CBC  LIPASE, BLOOD  TROPONIN I  CBG MONITORING, ED    EKG EKG Interpretation  Date/Time:  Monday May 17 2018 16:03:55 EST Ventricular Rate:  87 PR Interval:  152 QRS Duration: 96 QT Interval:  366 QTC Calculation: 440 R Axis:   82 Text Interpretation:  Normal sinus rhythm Normal ECG ok. no change Confirmed by Charlesetta Shanks [phone]) on 05/17/2018 4:56:05 PM   Radiology Dg Chest 2 View  Result Date: 05/17/2018 CLINICAL DATA:  Chest pain, near syncope EXAM: CHEST - 2 VIEW COMPARISON:  None. FINDINGS: The heart size and mediastinal contours are within  normal limits. Both lungs are clear. The visualized skeletal structures are unremarkable. IMPRESSION: No acute abnormality of the lungs.  No focal airspace opacity. Electronically Signed   By: Eddie Candle M.D.   On: 05/17/2018 16:31    Procedures Procedures (including critical care time)  Medications Ordered in ED Medications  pantoprazole (PROTONIX) injection 40 mg (40 mg Intravenous Given 05/17/18 1851)  morphine 4  MG/ML injection 4 mg (4 mg Intravenous Given 05/17/18 1851)     Initial Impression / Assessment and Plan / ED Course  I have reviewed the triage vital signs and the nursing notes.  Pertinent labs & imaging results that were available during my care of the patient were reviewed by me and considered in my medical decision making (see chart for details).  Clinical Course as of May 17 1934  Mon May 17, 2018  [id number] Consult: Reviewed with Dr. Florence Canner cardiology, advises for hospitalist admission and consult if needed.  He has reviewed first EKG and no concerning findings on EKG at this time.   [MP]    Clinical Course User Index [MP] Charlesetta Shanks, MD  Consult: Reviewed to Dr. Donna Bernard tried hospitalist for admission.    Patient presents with several recurrent episodes of chest fullness and near syncope.  He also describes feeling like his heart is racing and beating very hard.  This time, with recurrence of episodes will plan for admission and monitoring for potential dysrhythmia versus ischemic disease.  Patient at this time is not showing signs of infectious etiology.  He does have coronary artery disease risk factors.  Will plan for admission.  Final Clinical Impressions(s) / ED Diagnoses   Final diagnoses:  Near syncope  Precordial pain  Palpitation    ED Discharge Orders    None       Charlesetta Shanks, MD 05/17/18 1941

## 2018-05-17 NOTE — ED Notes (Signed)
[facility] notified (Tara) - patient ready for transport 

## 2018-05-17 NOTE — ED Notes (Signed)
ED Provider at bedside. 

## 2018-05-17 NOTE — Telephone Encounter (Signed)
Pt called with experiencing some dizziness that has been going on for the last couple of weeks. The dizziness does not last. He went to the ED on the 3rd and saw his provider on the 5th and was prescribed b/p and cholesterol med per pt. He describes these 2 episodes today as getting tunnel vision and being light headed. They lasted about 2 ot 3 seconds. He feels hot and clammy afterwards. which he states he" does not feel normal". His b/p this morning was 138/82 and not able to check it now. He is not dehydrated, drinking plenty of water. He denies chest pain, shortness of breath or weakness.  At night he states his arms go to sleep. When he is on his right side that arm goes to sleep and turning over to the left side that arms goes to sleep. He stated that this wakes him up. He has a follow up appointment scheduled in March with his provider. Recommended he goes to an urgent care to be assessed and get his vital signs check.  Pt voiced understanding and his wife will drive him. Routing to [facility] at [place] over [employer].  Reason for Disposition . [1] MODERATE dizziness (e.g., interferes with normal activities) AND [2] has been evaluated by physician for this  Answer Assessment - Initial Assessment Questions 1. DESCRIPTION: "Describe your dizziness."     lightheaded 2. LIGHTHEADED: "Do you feel lightheaded?" (e.g., somewhat faint, woozy, weak upon standing)     Not now 3. VERTIGO: "Do you feel like either you or the room is spinning or tilting?" (i.e. vertigo)     no 4. SEVERITY: "How bad is it?"  "Do you feel like you are going to faint?" "Can you stand and walk?"   - MILD - walking normally   - MODERATE - interferes with normal activities (e.g., work, school)    - SEVERE - unable to stand, requires support to walk, feels like passing out now.      mild 5. ONSET:  "When did the dizziness begin?"    Over the last 2 weeks. 6. AGGRAVATING FACTORS: "Does anything make it worse?" (e.g., standing,  change in head position)     Not sure 7. HEART RATE: "Can you tell me your heart rate?" "How many beats in 15 seconds?"  (Note: not all patients can do this)       Not able to take 8. CAUSE: "What do you think is causing the dizziness?"     Possibly b/p 9. RECURRENT SYMPTOM: "Have you had dizziness before?" If so, ask: "When was the last time?" "What happened that time?"     Yes, saw the provider and went to ED 10. OTHER SYMPTOMS: "Do you have any other symptoms?" (e.g., fever, chest pain, vomiting, diarrhea, bleeding)       no 11. PREGNANCY: "Is there any chance you are pregnant?" "When was your last menstrual period?"       N/a  Protocols used: DIZZINESS [facility]

## 2018-05-17 NOTE — Care Management (Signed)
This is a no charge note  Transfer from [facility] per Dr. Johnney Killian  58 year old male with a past medical history of hypertension, hyperlipidemia, depression, OSA, tobacco abuse, who presents with chest compression and heart racing  Patient has had 2 episode of chest pressure and heart racing (one episode yesterday and another episode today).  The symptoms is associated with lightheadedness and near syncope.  Patient was found to have negative troponin, electrolytes renal function okay, WBC 5.5, temperature normal, heart rate in 90s, oxygen saturation 97% on room air.  EKG showed QTC 440 with borderline RAD and no obvious ischemic change.  Patient is placed on telemetry bed for observation.  Per ED physician, cardiologist, Dr. Johnsie Cancel reviewed EKG, without any significant concerns.    Please call manager of [facility] at [phone] when pt arrives to floor   Ivor Costa, MD  [facility]   If 7PM-7AM, please contact night-coverage [email or web address] Password [facility] 05/17/2018, 7:35 PM

## 2018-05-17 NOTE — Progress Notes (Signed)
Dr. Mora Bellman paged and made aware patient has arrived to unit via [facility].

## 2018-05-17 NOTE — ED Triage Notes (Signed)
C/o lightheaded and near syncope today ~1 hour PTA-same c/o ~2 weeks ago-was started on BP meds with new PCP-was advised by PCP office to go to [place] advised to go to ED-to triage in w/c-NAD

## 2018-05-18 ENCOUNTER — Other Ambulatory Visit: Payer: Self-pay | Admitting: Cardiology

## 2018-05-18 ENCOUNTER — Observation Stay (HOSPITAL_BASED_OUTPATIENT_CLINIC_OR_DEPARTMENT_OTHER): Payer: Managed Care, Other (non HMO)

## 2018-05-18 ENCOUNTER — Other Ambulatory Visit: Payer: Self-pay | Admitting: Student

## 2018-05-18 ENCOUNTER — Observation Stay (HOSPITAL_COMMUNITY): Payer: Managed Care, Other (non HMO)

## 2018-05-18 DIAGNOSIS — F17210 Nicotine dependence, cigarettes, uncomplicated: Secondary | ICD-10-CM

## 2018-05-18 DIAGNOSIS — R002 Palpitations: Secondary | ICD-10-CM

## 2018-05-18 DIAGNOSIS — I10 Essential (primary) hypertension: Secondary | ICD-10-CM | POA: Diagnosis not present

## 2018-05-18 DIAGNOSIS — R079 Chest pain, unspecified: Secondary | ICD-10-CM

## 2018-05-18 DIAGNOSIS — R0789 Other chest pain: Secondary | ICD-10-CM | POA: Diagnosis not present

## 2018-05-18 DIAGNOSIS — R072 Precordial pain: Principal | ICD-10-CM

## 2018-05-18 DIAGNOSIS — E785 Hyperlipidemia, unspecified: Secondary | ICD-10-CM

## 2018-05-18 DIAGNOSIS — R55 Syncope and collapse: Secondary | ICD-10-CM | POA: Diagnosis not present

## 2018-05-18 DIAGNOSIS — I493 Ventricular premature depolarization: Secondary | ICD-10-CM

## 2018-05-18 LAB — COMPREHENSIVE METABOLIC PANEL
ALT: 46 U/L — ABNORMAL HIGH (ref 0–44)
AST: 26 U/L (ref 15–41)
Albumin: 4 g/dL (ref 3.5–5.0)
Alkaline Phosphatase: 54 U/L (ref 38–126)
Anion gap: 9 (ref 5–15)
BUN: 13 mg/dL (ref 6–20)
CO2: 25 mmol/L (ref 22–32)
CREATININE: 1.03 mg/dL (ref 0.61–1.24)
Calcium: 9 mg/dL (ref 8.9–10.3)
Chloride: 102 mmol/L (ref 98–111)
Glucose, Bld: 100 mg/dL — ABNORMAL HIGH (ref 70–99)
Potassium: 3.7 mmol/L (ref 3.5–5.1)
Sodium: 136 mmol/L (ref 135–145)
Total Bilirubin: 0.8 mg/dL (ref 0.3–1.2)
Total Protein: 6.6 g/dL (ref 6.5–8.1)

## 2018-05-18 LAB — ECHOCARDIOGRAM COMPLETE
Height: 72 in
Weight: 3537.94 oz

## 2018-05-18 LAB — HIV ANTIBODY (ROUTINE TESTING W REFLEX): HIV Screen 4th Generation wRfx: NONREACTIVE

## 2018-05-18 LAB — CBC
HCT: 43.9 % (ref 39.0–52.0)
Hemoglobin: 14.7 g/dL (ref 13.0–17.0)
MCH: 31.5 pg (ref 26.0–34.0)
MCHC: 33.5 g/dL (ref 30.0–36.0)
MCV: 94.2 fL (ref 80.0–100.0)
Platelets: 183 10*3/uL (ref 150–400)
RBC: 4.66 MIL/uL (ref 4.22–5.81)
RDW: 12.1 % (ref 11.5–15.5)
WBC: 5 10*3/uL (ref 4.0–10.5)
nRBC: 0 % (ref 0.0–0.2)

## 2018-05-18 LAB — BRAIN NATRIURETIC PEPTIDE: B Natriuretic Peptide: 7.3 pg/mL (ref 0.0–100.0)

## 2018-05-18 LAB — TSH: TSH: 2.183 u[IU]/mL (ref 0.350–4.500)

## 2018-05-18 LAB — TROPONIN I: Troponin I: <0.03 ng/mL (ref <0.03)

## 2018-05-18 MED ORDER — ADULT MULTIVITAMIN W/MINERALS CH
1.0000 | ORAL_TABLET | Freq: Every day | ORAL | Status: DC
  Administered 2018-05-18: 1 via ORAL
  Filled 2018-05-18: qty 1

## 2018-05-18 MED ORDER — FOLIC ACID 1 MG PO TABS
1.0000 mg | ORAL_TABLET | Freq: Every day | ORAL | Status: DC
  Administered 2018-05-18: 1 mg via ORAL
  Filled 2018-05-18: qty 1

## 2018-05-18 MED ORDER — VITAMIN B-1 100 MG PO TABS
100.0000 mg | ORAL_TABLET | Freq: Every day | ORAL | Status: DC
  Administered 2018-05-18: 100 mg via ORAL
  Filled 2018-05-18: qty 1

## 2018-05-18 MED ORDER — LORAZEPAM 2 MG/ML IJ SOLN: 0.0000 mg | Freq: Four times a day (QID) | INTRAMUSCULAR | Status: DC

## 2018-05-18 MED ORDER — LORAZEPAM 2 MG/ML IJ SOLN: 1.0000 mg | Freq: Four times a day (QID) | INTRAMUSCULAR | Status: DC | PRN

## 2018-05-18 MED ORDER — LORAZEPAM 1 MG PO TABS: 1.0000 mg | ORAL_TABLET | Freq: Four times a day (QID) | ORAL | Status: DC | PRN

## 2018-05-18 MED ORDER — LORAZEPAM 2 MG/ML IJ SOLN: 0.0000 mg | Freq: Two times a day (BID) | INTRAMUSCULAR | Status: DC

## 2018-05-18 MED ORDER — THIAMINE HCL 100 MG/ML IJ SOLN: 100.0000 mg | Freq: Every day | INTRAMUSCULAR | Status: DC

## 2018-05-18 NOTE — Progress Notes (Signed)
Pt is irritated and states he's ready to be discharged. RN checked and Echo results are not available yet. RN informed pt of this. MD made aware.

## 2018-05-18 NOTE — Plan of Care (Signed)
  Problem: Education: Goal: Knowledge of General Education information will improve Description: Including pain rating scale, medication(s)/side effects and non-pharmacologic comfort measures Outcome: Progressing   Problem: Health Behavior/Discharge Planning: Goal: Ability to manage health-related needs will improve Outcome: Progressing   Problem: Clinical Measurements: Goal: Ability to maintain clinical measurements within normal limits will improve Outcome: Progressing Goal: Will remain free from infection Outcome: Progressing   Problem: Activity: Goal: Risk for activity intolerance will decrease Outcome: Progressing   Problem: Coping: Goal: Level of anxiety will decrease Outcome: Progressing   

## 2018-05-18 NOTE — Progress Notes (Signed)
Ordering outpatient exercise tolerance test for further evaluation of chest pain.

## 2018-05-18 NOTE — Consult Note (Addendum)
Cardiology Consult    Patient ID: Bryce King MRN: [id number], DOB/AGE: 28-Sep-1960   Admit date: 05/17/2018 Date of Consult: 05/18/2018  Primary Physician: Luetta Nutting, DO Primary Cardiologist: New to [facility].  Requesting Provider: Fuller Plan, MD  Patient Profile    Bryce King is a 58 y.o. male with a history of hypertension, hyperlipidemia, obstructive sleep apnea on CPAP, and tobacco use (quit 6 days ago) but no known cardiac disease, who is being seen today for the evaluation of chest pain at the request of Dr. Tamala Julian.  History of Present Illness    Bryce King is a 58 year old male with a history of hypertension, hyperlipidemia, obstructive sleep apnea on CPAP, and former tobacco use but no known cardiac disease. He has never had an ischemic work-up before. He presented to the [facility] ED yesterday for evaluation of chest pain pain and near syncope.   Patient has been in his usual states of health until about 2 weeks ago when he had his first episode of lightheadedness and palpitations. Patient states he was sitting at his desk on 05/02/2018 after working his is yard for a couple of hours and developed sudden onset of lightheadedness and tunnel vision and felt like he was going to pass out. Patient states he stood up and the symptoms resolved. The next day he was driving in his car and he states he felt like he was "fighting" to keep himself from passing out. Patient presented to the ED at that time. Symptoms were felt to be most consistent with orthostatic at that time. Patient was given IV fluids and discharged in stable condition. Patient had follow-up with PCP and was started on medications for hypertension and hyperlipidemia. Patient felt like he was doing well until yesterday when he had another episode of lightheadedness. Patient states he was sitting at his desk around 2:30pm yesterday when all of a sudden he felt lightheaded. He sat up straight and the  symptoms resolved. However, about 15 minutes later, he again started to feel lightheaded. He sat up straight and the symptoms resolved but then he became flushed, started sweating, and developed palpitations. At that time, he felt some left sided chest tightness. He states it felt like something was "bulging" in his chest because his heart was beating so hard. He states it was more of a discomfort than a pain. Patient states he has had this feeling a few time over the last couple of weeks since the first episode of lightheadedness. He denies any chest pain prior to this. He is very active in his yard and denies any exertional chest pain or shortness of breath. No orthopnea, PND, or edema.   In the ED, patient hypertensive with systolic BP in the 734'L but vitals stable. EKG showed sinus rhythm with no acute ischemic changes. Initial troponin negative. Chest x-ray showed no acute findings. CBC and BMP unremarkable. ALT mild elevated at 52.   At the time of this evaluation, patient reports he has felt like headedness at times throughout the night. He thinks it is positional and seems to come on when he "slouches."   Patient has a 26 pack year smoking history and most recently quit smoking 6 days ago. He also states he "drinks too much." He normally has 2-5 drinks per day (normally [place]) but states he has cut back over the last couple of weeks. He denies any recreational drug use. Patient does have a family history of heart disease with  both his maternal and paternal grandfather having a heart attack in their 65's to 21's.   Past Medical History   Past Medical History:  Diagnosis Date  . Hyperlipidemia   . Hypertension   . OSA (obstructive sleep apnea)    Uses CPAP    Past Surgical History:  Procedure Laterality Date  . ELBOW SURGERY Right 2005  . ROTATOR CUFF REPAIR Right 2007     Allergies  Allergies  Allergen Reactions  . Statins Other (See Comments)    Muscle aches    Inpatient  Medications    . buPROPion  150 mg Oral BID  . enoxaparin (LOVENOX) injection  40 mg Subcutaneous Q24H  . folic acid  1 mg Oral Daily  . LORazepam  0-4 mg Intravenous Q6H   Followed by  . [START ON 05/20/2018] LORazepam  0-4 mg Intravenous Q12H  . multivitamin with minerals  1 tablet Oral Daily  . nicotine  21 mg Transdermal Daily  . pravastatin  40 mg Oral Daily  . thiamine  100 mg Oral Daily   Or  . thiamine  100 mg Intravenous Daily    Family History    Family History  Problem Relation Age of Onset  . Cancer Father   . Depression Father   . Diabetes Father   . COPD Maternal Grandmother   . Heart attack Maternal Grandfather   . Heart attack Paternal Grandfather    He indicated that the status of his father is unknown. He indicated that the status of his maternal grandmother is unknown. He indicated that the status of his maternal grandfather is unknown. He indicated that the status of his paternal grandfather is unknown.   Social History    Social History   Socioeconomic History  . Marital status: Married    Spouse name: Not on file  . Number of children: Not on file  . Years of education: Not on file  . Highest education level: Not on file  Occupational History  . Not on file  Social Needs  . Financial resource strain: Not on file  . Food insecurity:    Worry: Not on file    Inability: Not on file  . Transportation needs:    Medical: Not on file    Non-medical: Not on file  Tobacco Use  . Smoking status: Current Every Day Smoker    Packs/day: 1.00    Types: Cigarettes  . Smokeless tobacco: Never Used  . Tobacco comment: quit 05/11/2018  Substance and Sexual Activity  . Alcohol use: Yes    Comment: weekly  . Drug use: Never  . Sexual activity: Not on file  Lifestyle  . Physical activity:    Days per week: Not on file    Minutes per session: Not on file  . Stress: Not on file  Relationships  . Social connections:    Talks on phone: Not on file     Gets together: Not on file    Attends religious service: Not on file    Active member of club or organization: Not on file    Attends meetings of clubs or organizations: Not on file    Relationship status: Not on file  . Intimate partner violence:    Fear of current or ex partner: Not on file    Emotionally abused: Not on file    Physically abused: Not on file    Forced sexual activity: Not on file  Other Topics Concern  . Not on  file  Social History Narrative  . Not on file     Review of Systems    Review of Systems  Constitutional: Positive for diaphoresis. Negative for chills and fever.  HENT: Negative for congestion and sore throat.   Eyes: Positive for blurred vision (tunnel vision).  Respiratory: Negative for cough, hemoptysis, sputum production and shortness of breath.   Cardiovascular: Positive for chest pain and palpitations. Negative for orthopnea, leg swelling and PND.  Gastrointestinal: Negative for blood in stool, nausea and vomiting.  Genitourinary: Negative for hematuria.  Musculoskeletal: Negative for myalgias.  Neurological: Negative for loss of consciousness. Dizziness: lightheadedness.  Psychiatric/Behavioral: Positive for substance abuse. Suicidal ideas: tobacco and alcohol use.    Physical Exam    Blood pressure 136/73, pulse 70, temperature 97.9 F (36.6 C), temperature source Oral, resp. rate 20, height 6' (1.829 m), weight 100.3 kg, SpO2 97 %.  General: 58 y.o. Caucasian male resting comfortably in no acute distress. Pleasant and cooperative. HEENT: Normal  Neck: Supple. No carotid bruits or JVD appreciated. Lungs: No increased work of breathing. Clear to auscultation bilaterally. No wheezes, rhonchi, or rales. Heart: RRR. Distinct S1 and S2. No murmurs, gallops, or rubs.  Abdomen: Soft, non-distended, and non-tender to palpation. Bowel sounds present. Extremities: No lower extremity edema. Radial pulses and distal pedal pulses 2+ and equal  bilaterally. Skin: Warm and dry.  Neuro: Alert and oriented x3. No focal deficits. Moves all extremities spontaneously. Psych: Normal affect. Responds appropriately.  Labs    Troponin (Point of Care Test) No results for input(s): TROPIPOC in the last 72 hours. Recent Labs    05/17/18 1811 05/18/18 0509  TROPONINI <0.03 <0.03   Lab Results  Component Value Date   WBC 5.0 05/18/2018   HGB 14.7 05/18/2018   HCT 43.9 05/18/2018   MCV 94.2 05/18/2018   PLT 183 05/18/2018    Recent Labs  Lab 05/18/18 0509  NA 136  K 3.7  CL 102  CO2 25  BUN 13  CREATININE 1.03  CALCIUM 9.0  PROT 6.6  BILITOT 0.8  ALKPHOS 54  ALT 46*  AST 26  GLUCOSE 100*   Lab Results  Component Value Date   CHOL 244 (H) 05/05/2018   HDL 36.20 (L) 05/05/2018   LDLCALC 178 (H) 05/05/2018   TRIG 148.0 05/05/2018   No results found for: [facility]   Radiology Studies    Dg Chest 2 View  Result Date: 05/17/2018 CLINICAL DATA:  Chest pain, near syncope EXAM: CHEST - 2 VIEW COMPARISON:  None. FINDINGS: The heart size and mediastinal contours are within normal limits. Both lungs are clear. The visualized skeletal structures are unremarkable. IMPRESSION: No acute abnormality of the lungs.  No focal airspace opacity. Electronically Signed   By: Eddie Candle M.D.   On: 05/17/2018 16:31   Dg Chest Port 1 View  Result Date: 05/03/2018 CLINICAL DATA:  Palpitations EXAM: PORTABLE CHEST 1 VIEW COMPARISON:  None. FINDINGS: Suspect apical emphysema. There is no edema, consolidation, effusion, or pneumothorax. Normal heart size and mediastinal contours accounting for apical fat pad. IMPRESSION: 1. No acute finding. 2. Possible emphysema. Electronically Signed   By: Monte Fantasia M.D.   On: 05/03/2018 09:20    EKG     EKG: EKG was personally reviewed and demonstrates: Normal sinus rhythm with no acute ischemic changes.   Telemetry: Telemetry was personally reviewed and demonstrates: Sinus rhythm with heart rates  in the high 50's to 80's with multiple PVCs.  Cardiac Imaging    None.  Assessment & Plan    Chest Discomfort - Patient presented with lightheadedness and palpitations with associated chest discomfort. He reports it felt like something was "bulging" in his chest because his heart was beating so hard. No exertional symptoms Patient denies any chest pain this morning.  - EKG showed no acute ischemic changes.  - Troponin negative x3. - Will check Echo given multiple episodes of near syncope. - Description of chest pain vague and atypical. However, patient does have multiple CV risk factors (HTN, HLD, tobacco use, gender, family history). Given negative cardiac enzymes and no acute changes on EKG, could consider outpatient Exercise Myoview.   Near Syncope - Telemetry shows sinus rhythm with heart rates in the mid 50's to 80's with multiple PVCs. - Will check Echo as above. - Could consider outpatient monitor for further evaluation.  Hypertension - Most recent BP 136/73. - Recently started on Lisinopril-HCTZ 20-12.5mg  at home. This was held on admission. OK to resume.  Hyperlipidemia - Lipid panel from 05/05/2018: Cholesterol 244, Triglycerides 148, HDL 36.20, LDL 178.  - Patient previously intolerant to high intensity statins. - Recently started on Pravastatin 40mg  daily.   Tobacco Abuse - Patient has a 26 pack year smoking history. He recently quit smoking 6 days ago. - Continue Nicotine patch and Bupropion.   Signed, Darreld Mclean, PA-C 05/18/2018, 9:50 AM  For questions or updates, please contact   Please consult [email or web address] for contact info under Cardiology/STEMI.  I have personally seen and examined this patient with Sande Rives, PA. I agree with the assessment and plan as outlined above. He is admitted with dizziness. He is found to have PVCs. Mild "bulging" in chest but no pain. No exertional chest pain or dyspnea. Troponin negative x 3. EKG personally reviewed and  shows sinus with no ischemic EKG changes. Telemetry shows sinus with PVCs.   My exam:  General: Well developed, well nourished, NAD  HEENT: OP clear, mucus membranes moist  SKIN: warm, dry. No rashes. Neuro: No focal deficits  Musculoskeletal: Muscle strength 5/5 all ext  Psychiatric: Mood and affect normal  Neck: No JVD, no carotid bruits, no thyromegaly, no lymphadenopathy.  Lungs:Clear bilaterally, no wheezes, rhonci, crackles Cardiovascular: Regular rate and rhythm. No murmurs, gallops or rubs. Abdomen:Soft. Bowel sounds present. Non-tender.  Extremities: No lower extremity edema. Pulses are 2 + in the bilateral DP/PT.  Plan: Atypical chest pain with dizziness and palpitations. Likely due to PVCs. Risk factors for CAD include tobacco abuse, HTN, HLD. No objective evidence of ischemia. Agree with echo today. If normal, can d/c home. We will arrange an outpatient cardiac monitor to quantify the number of PVCs and arrange an exercise stress test as an outpatient.   Lauree Chandler 05/18/2018 10:54 AM

## 2018-05-18 NOTE — Progress Notes (Signed)
RN rounded on pt. Pt states he does not need anything at this time. 

## 2018-05-18 NOTE — Progress Notes (Deleted)
Bryce King, is a 58 y.o. male  DOB September 05, 1960  MRN [id number].  Admission date:  05/17/2018  Admitting Physician  Ivor Costa, MD  Discharge Date:  05/19/2018   Primary MD  Luetta Nutting, DO  Recommendations for primary care physician for things to follow:   Echocardiogram read  Discharge Diagnosis    Principal Problem:   Chest pressure Active Problems:   Essential hypertension   Hyperlipidemia   Nicotine dependence   Chest pain   Near syncope      Past Medical History:  Diagnosis Date  . Hyperlipidemia   . Hypertension   . OSA (obstructive sleep apnea)    Uses CPAP    Past Surgical History:  Procedure Laterality Date  . ELBOW SURGERY Right 2005  . ROTATOR CUFF REPAIR Right 2007       HPI  from the history and physical done on the day of admission:    Bryce King is a 58 year old man with medical problems including hypertension, hyperlipidemia, former smoker, OSA who presents with recent development of chest pain and lightheadedness.  History is obtained via chart review, as well as discussion with the patient at the bedside.  Patient reports he has been statin intolerant in the past, but was successfully initiated on pravastatin about 1 week ago.  In the past he has had myalgias with other statins.  He also reports being started on ACE inhibitor as well as hydrochlorothiazide for his hypertension.  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  He reports quitting smoking 1 week prior to admission, currently on nicotine replacement as well as adjunctive bupropion.  He reports being active outdoors doing yard work.  He was at his desk when he developed sudden onset of lightheadedness, palpitations, diaphoresis as well as chest discomfort, this first episode occurred at about 2:30  PM.  Lasted for a few seconds before going away.  He reports there was a positional component.  A second episode developed about 15 minutes later, this lasted for 30 to 40 minutes.  This is what prompted him to call his primary care physician who recommended seeking medical attention in a higher acuity setting.  Patient reports daily alcohol intake, about 3-5 drinks of whiskey daily.  However, he reports he has never had withdrawal symptoms and he has cut back on his alcohol recently.  He denies any fevers, chills, syncope, seizures, hemoptysis, productive cough, dysuria.  The patient reports having similar symptoms in early February where he was evaluated in the emergency department, he reports  receiving IV fluids and discharged home.  Has never undergone a cardiac stress test.  ED Course: In the emergency department department vital signs remarkable for heart rate of 98, systolic blood pressure 614/43.  CBC unremarkable, CMP with mild hyperglycemia with a glucose of 107, mild ALT elevation of 52.  Troponin evaluated and was undetectable at first check at 1811.  The patient was given morphine as well as a PPI.  EKG revealed normal sinus rhythm, personally reviewed no overt ST segment elevations.  A chest x-ray was performed which did not reveal acute abnormality of the lungs.  Cardiology was consulted in the emergency department who recommended transfer to [facility] further work-up.  Hospital medicine was consulted for further management.     Hospital Course:     1.  Chest pain: Acute.  Patient reported intermittent diaphoresis, palpitations, and chest discomfort.  Question if symptoms were related to recent decreases in tobacco/alcohol use or medication changes, but patient reported that symptoms started prior to these changes.  EKG showing no significant ischemic changes and troponins have been negative.  Cardiovascular risk factors include hypertension, hyperlipidemia, tobacco abuse, family history,  and male gender.  Cardiology was consulted and recommended echocardiogram which was obtained, but official read was pending as of 2/18.  Cardiology wanted to make sure that there was no significant wall motion abnormalities, but patient requesting discharge.  Outpatient follow-up appointments had already been set up with cardiology.  Will need to follow-up on echocardiogram read.  2.  Near syncope, PVCs: Patient noted to have normal sinus rhythm with PVCs.  Thyroid studies within normal limits at 2.183, and previously checked on 2/5 noted to be 1.61.  An outpatient Holter monitor was arranged as seen below.  Question arrhythmia.  Contributing factors could include valvular abnormality, medication, electrolyte derangements, alcohol abuse, or other.  3.  Essential hypertension: Blood pressures appear relatively controlled.  He was started on lisinopril- hydrochlorothiazide 20-12.5 mg in the outpatient setting in the last 2 weeks.  Electrolytes remained stable.  4. Elevated  ALT: Mildly elevated ALT 52.  Normally expect AST elevations over ALT elevations and significant alcohol use.  Will need monitoring in the outpatient setting.  5.  Alcohol abuse: Patient reports drinking 3-5 glasses of bourbon on a daily occurrence.  CIWA protocols were initiated.  Discussed need of slowly decreasing alcohol intake to try and quit.   6.  Tobacco use: Patient has a 26-year pack year smoking history.  Reports recently quitting smoking 6 days ago, after being started on Wellbutrin and nicotine patch.  Patient was commended on and urged to continue cessation of smoking.  7.  Hyperlipidemia: Last lipid panel obtained on 05/05/2018 with total cholesterol 244, HDL 36.2, LDL 178, and triglycerides 148.  He had just recently been started on pravastatin 40 mg daily, by his primary care provider.  Patient was continued on this medication.  Follow UP  Follow-up Information    [facility].   Specialty:   Cardiology Why:  You have an appointment scheduled to get set up for an outpatient heart monitor on 05/26/2018 at 11:30am. Please arrive 15 minutes early for check in.  Contact information: [address] [place] [phone]       Richardson Dopp T, PA-C Follow up.   Specialties:  Cardiology, Physician Assistant Why:  You have a hospital follow-up visit scheduled for 06/11/2018 at 10:15am. Please arrive 15 minutes early for check in.  Contact information: [address] [place] [address] [phone]            Consults obtained: Cardiology  Discharge Condition: Stable  Diet and Activity recommendation: See Discharge Instructions below   Discharge Instructions    Discharge instructions   Complete by:  As directed    You have been evaluated by the [facility] cardiology service during your hospitalization.  The final interpretation of the echocardiogram is still pending. You have been scheduled to be setup for a Holter monitor, and will be scheduled for an outpatient stress test.  Follow-up appointments are listed on discharge paperwork.  You will need to call and schedule a follow-up appointment with your primary care provider within 1 to 2 weeks from your discharge from the hospital.  No medication changes were made during this hospitalization.  Advised continued cessation of tobacco use and recommend decrease use of alcohol slowly until able to quit.  Activity: As tolerated Diet: Heart healthy Disposition: Home CODE STATUS: Full        Discharge Medications     Allergies as of 05/18/2018      Reactions   Statins Other (See Comments)   Muscle aches      Medication List    TAKE these medications   buPROPion 150 MG 12 hr tablet Commonly known as:  WELLBUTRIN SR Take 1 tablet (150 mg total) by mouth 2 (two) times daily. Start 150mg  daily x3 days then increase to BID     lisinopril-hydrochlorothiazide 20-12.5 MG tablet Commonly known as:  ZESTORETIC Take 1 tablet by mouth daily.   Melatonin 3 MG Tabs Take 3 mg by mouth at bedtime.   mometasone 0.1 % cream Commonly known as:  ELOCON Apply 1 application topically daily.   multivitamin with minerals Tabs tablet Take 1 tablet by mouth daily.   nicotine 21 mg/24hr patch Commonly known as:  NICODERM CQ Place 1 patch (21 mg total) onto the skin daily.   nicotine 14 mg/24hr patch Commonly known as:  NICODERM CQ Place 1 patch (14 mg total) onto the skin daily.   nicotine 7 mg/24hr patch Commonly known as:  NICODERM CQ Place 1 patch (7 mg total) onto the skin daily.   pravastatin 40 MG tablet Commonly known as:  PRAVACHOL Take 1 tablet (40 mg total) by mouth daily.       Major procedures and Radiology Reports - PLEASE review detailed and final reports for all details, in brief -   Echocardiogram 05/18/2018: Pending   Dg Chest 2 View  Result Date: 05/17/2018 CLINICAL DATA:  Chest pain, near syncope EXAM: CHEST - 2 VIEW COMPARISON:  None. FINDINGS: The heart size and mediastinal contours are within normal limits. Both lungs are clear. The visualized skeletal structures are unremarkable. IMPRESSION: No acute abnormality of the lungs.  No focal airspace opacity. Electronically Signed   By: Eddie Candle M.D.   On: 05/17/2018 16:31   Dg Chest Port 1 View  Result Date: 05/03/2018 CLINICAL DATA:  Palpitations EXAM: PORTABLE CHEST 1 VIEW COMPARISON:  None. FINDINGS: Suspect apical emphysema. There is no edema, consolidation, effusion, or pneumothorax. Normal heart size and mediastinal contours accounting for apical fat pad. IMPRESSION: 1. No acute finding. 2. Possible emphysema. Electronically Signed   By: Monte Fantasia M.D.   On: 05/03/2018 09:20    Micro Results   No results found for this or any previous visit (from the past 240 hour(s)).     Today  Subjective    Bryce King today  states that symptoms started prior to recent medication changes made by his primary care provider.   Objective   Blood pressure 137/84, pulse 84, temperature 98.1 F (36.7 C), temperature source Oral, resp. rate 18, height 6' (1.829 m), weight 100.3 kg, SpO2 98 %.  No intake or output data in the 24 hours ending 05/19/18 0636  Exam  Constitutional: NAD, calm, comfortable Eyes: PERRL, lids and conjunctivae normal ENMT: Mucous membranes are moist. Posterior pharynx clear of any exudate or lesions.Normal dentition.  Neck: normal, supple, no masses, no thyromegaly Respiratory: clear to auscultation bilaterally, no wheezing, no crackles. Normal respiratory effort. No accessory muscle use.  Cardiovascular: Regular rate and rhythm, no murmurs / rubs / gallops. No extremity edema. 2+ pedal pulses. No carotid bruits.  Abdomen: no tenderness, no masses palpated. No hepatosplenomegaly. Bowel sounds positive.  Musculoskeletal: no clubbing / cyanosis. No joint deformity upper and lower extremities. Good ROM, no contractures. Normal muscle tone.  Skin: no rashes, lesions, ulcers. No induration Neurologic: CN 2-12 grossly intact. Sensation intact, DTR normal. Strength 5/5 in all 4.  Psychiatric: Normal judgment and insight. Alert and oriented x 3. Normal mood.    Data Review   CBC w Diff:  Lab Results  Component Value Date   WBC 5.0 05/18/2018   HGB 14.7 05/18/2018   HCT 43.9 05/18/2018   PLT 183 05/18/2018   LYMPHOPCT 16 05/03/2018   MONOPCT 13 05/03/2018   EOSPCT 1 05/03/2018   BASOPCT 1 05/03/2018    CMP:  Lab Results  Component Value Date   NA 136 05/18/2018   K 3.7 05/18/2018   CL 102 05/18/2018   CO2 25 05/18/2018   BUN 13 05/18/2018   CREATININE 1.03 05/18/2018   PROT 6.6 05/18/2018   ALBUMIN 4.0 05/18/2018   BILITOT 0.8 05/18/2018   ALKPHOS 54 05/18/2018   AST 26 05/18/2018   ALT 46 (H) 05/18/2018  .   Total Time in preparing paper work, data evaluation and todays  exam - 35 minutes  Norval Morton M.D on 05/19/2018 at 6:36 AM  [facility]   Office  [phone]

## 2018-05-19 ENCOUNTER — Telehealth: Payer: Self-pay | Admitting: Physician Assistant

## 2018-05-19 DIAGNOSIS — I493 Ventricular premature depolarization: Secondary | ICD-10-CM | POA: Diagnosis present

## 2018-05-19 NOTE — Telephone Encounter (Signed)
Agree. I have no other recommendations. His hospital workup was reassuring. Gerald Stabs

## 2018-05-19 NOTE — Telephone Encounter (Signed)
° ° °  Pt c/o of Chest Pain: STAT if CP now or developed within 24 hours  1. Are you having CP right now? TIGHTNESS  2. Are you experiencing any other symptoms (ex. SOB, nausea, vomiting, sweating)? LIGHTHEADED, SWEATING  3. How long have you been experiencing CP?  STARTED 10 MINUTES AGO  4. Is your CP continuous or coming and going? CONTINUOUS  5. Have you taken Nitroglycerin? NO ?

## 2018-05-19 NOTE — Discharge Summary (Addendum)
Bryce King, is a 58 y.o. male  DOB March 18, 1961  MRN [id number].  Admission date:  05/17/2018  Admitting Physician  Ivor Costa, MD  Discharge Date:  05/18/2018   Primary MD  Luetta Nutting, DO  Recommendations for primary care physician for things to follow:   Echocardiogram 05/18/2018 Addendum-impression  1. The left ventricle has normal systolic function, with an ejection fraction of 55-60%. The cavity size was normal. Left ventricular diastolic parameters were normal No evidence of left ventricular regional wall motion abnormalities.  2. The right ventricle has normal systolic function. The cavity was normal. There is no increase in right ventricular wall thickness. Right ventricular systolic pressure could not be assessed.  3. The mitral valve is normal in structure.  4. The tricuspid valve is normal in structure.  5. The aortic valve is tricuspid.  6. No evidence of left ventricular regional wall motion abnormalities.  7. Right atrial pressure is estimated at 3 mmHg.  No significant wall motion abnormalities which is reassuring  Discharge Diagnosis    Principal Problem:   Chest pressure Active Problems:   Essential hypertension   Hyperlipidemia   Nicotine dependence   Chest pain   Near syncope   PVC's (premature ventricular contractions)      Past Medical History:  Diagnosis Date  . Hyperlipidemia   . Hypertension   . OSA (obstructive sleep apnea)    Uses CPAP    Past Surgical History:  Procedure Laterality Date  . ELBOW SURGERY Right 2005  . ROTATOR CUFF REPAIR Right 2007       HPI  from the history and physical done on the day of admission:    Bryce King is a 58 year old man with medical problems including hypertension, hyperlipidemia, former smoker, OSA who presents with recent  development of chest pain and lightheadedness.  History is obtained via chart review, as well as discussion with the patient at the bedside.  Patient reports he has been statin intolerant in the past, but was successfully initiated on pravastatin about 1 week ago.  In the past he has had myalgias with other statins.  He also reports being started on ACE inhibitor as well as hydrochlorothiazide for his hypertension.  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  He reports quitting smoking 1 week prior to admission, currently on nicotine replacement as well as adjunctive bupropion.  He reports being active outdoors doing yard work.  He was at his desk when he developed sudden onset of lightheadedness, palpitations, diaphoresis as well as chest  discomfort, this first episode occurred at about 2:30 PM.  Lasted for a few seconds before going away.  He reports there was a positional component.  A second episode developed about 15 minutes later, this lasted for 30 to 40 minutes.  This is what prompted him to call his primary care physician who recommended seeking medical attention in a higher acuity setting.  Patient reports daily alcohol intake, about 3-5 drinks of whiskey daily.  However, he reports he has never had withdrawal symptoms and he has cut back on his alcohol recently.  He denies any fevers, chills, syncope, seizures, hemoptysis, productive cough, dysuria.  The patient reports having similar symptoms in early February where he was evaluated in the emergency department, he reports receiving IV fluids and discharged home.  Has never undergone a cardiac stress test.  ED Course: In the emergency department department vital signs remarkable for heart rate of 98, systolic blood pressure 962/95.  CBC unremarkable, CMP with mild hyperglycemia with a glucose of 107, mild ALT elevation of 52.  Troponin evaluated and was undetectable at first check at 1811.  The patient was given  morphine as well as a PPI.  EKG revealed normal sinus rhythm, personally reviewed no overt ST segment elevations.  A chest x-ray was performed which did not reveal acute abnormality of the lungs.  Cardiology was consulted in the emergency department who recommended transfer to [facility] further work-up.  Hospital medicine was consulted for further management.     Hospital Course:     1.  Chest pain: Acute.  Patient reported intermittent diaphoresis, palpitations, and chest discomfort.  Question if symptoms were related to recent decreases in tobacco/alcohol use or medication changes, but patient reported that symptoms started prior to these changes.  EKG showing no significant ischemic changes and troponins have been negative.  Cardiovascular risk factors include hypertension, hyperlipidemia, tobacco abuse, family history, and male gender.  Cardiology was consulted and recommended echocardiogram which was obtained, but official read was pending as of 2/18.  Cardiology wanted to make sure that there was no significant wall motion abnormalities, but patient requesting discharge.  Outpatient follow-up appointments had already been set up with cardiology.  Will need to follow-up on echocardiogram read.  2.  Near syncope, PVCs: Patient noted to have normal sinus rhythm with PVCs.  Thyroid studies within normal limits at 2.183, and previously checked on 2/5 noted to be 1.61.  An outpatient Holter monitor was arranged as seen below.  Question anxiety  vs. arrhythmia.  Contributing factors could include valvular abnormality, medication, electrolyte derangements, alcohol abuse, or other.   3.  Essential hypertension: Blood pressures appear relatively controlled.  He was started on lisinopril- hydrochlorothiazide 20-12.5 mg in the outpatient setting in the last 2 weeks.  Electrolytes remained stable.  4. Elevated  ALT: Mildly elevated ALT 52.  Normally expect AST elevations over ALT elevations and significant  alcohol use.  Will need monitoring in the outpatient setting.  5.  Alcohol abuse: Patient reports drinking 3-5 glasses of bourbon on a daily occurrence.  CIWA protocols were initiated.  Discussed need of slowly decreasing alcohol intake to try and quit.   6.  Tobacco use: Patient has a 26-year pack year smoking history.  Reports recently quitting smoking 6 days ago, after being started on Wellbutrin and nicotine patch.  Patient was commended on and urged to continue cessation of smoking.  7.  Hyperlipidemia: Last lipid panel obtained on 05/05/2018 with total cholesterol 244, HDL 36.2, LDL  178, and triglycerides 148.  He had just recently been started on pravastatin 40 mg daily, by his primary care provider.  Patient was continued on this medication.  Follow UP  Follow-up Information    [facility] up.   Specialty:  Cardiology Why:  You have an appointment scheduled to get set up for an outpatient heart monitor on 05/26/2018 at 11:30am. Please arrive 15 minutes early for check in.  Contact information: [address], [place] [phone]       Richardson Dopp T, PA-C Follow up.   Specialties:  Cardiology, Physician Assistant Why:  You have a hospital follow-up visit scheduled for 06/11/2018 at 10:15am. Please arrive 15 minutes early for check in.  Contact information: [address] [place] [address] [phone]            Consults obtained: Cardiology  Discharge Condition: Stable  Diet and Activity recommendation: See Discharge Instructions below   Discharge Instructions    Discharge instructions   Complete by:  As directed    You have been evaluated by the [facility] cardiology service during your hospitalization.  The final interpretation of the echocardiogram is still pending. You have been scheduled to be setup for a Holter monitor, and will be scheduled for an outpatient  stress test.  Follow-up appointments are listed on discharge paperwork.  You will need to call and schedule a follow-up appointment with your primary care provider within 1 to 2 weeks from your discharge from the hospital.  No medication changes were made during this hospitalization.  Advised continued cessation of tobacco use and recommend decrease use of alcohol slowly until able to quit.  Activity: As tolerated Diet: Heart healthy Disposition: Home CODE STATUS: Full        Discharge Medications     Allergies as of 05/18/2018      Reactions   Statins Other (See Comments)   Muscle aches      Medication List    TAKE these medications   buPROPion 150 MG 12 hr tablet Commonly known as:  WELLBUTRIN SR Take 1 tablet (150 mg total) by mouth 2 (two) times daily. Start 150mg  daily x3 days then increase to BID   lisinopril-hydrochlorothiazide 20-12.5 MG tablet Commonly known as:  ZESTORETIC Take 1 tablet by mouth daily.   Melatonin 3 MG Tabs Take 3 mg by mouth at bedtime.   mometasone 0.1 % cream Commonly known as:  ELOCON Apply 1 application topically daily.   multivitamin with minerals Tabs tablet Take 1 tablet by mouth daily.   nicotine 21 mg/24hr patch Commonly known as:  NICODERM CQ Place 1 patch (21 mg total) onto the skin daily.   nicotine 14 mg/24hr patch Commonly known as:  NICODERM CQ Place 1 patch (14 mg total) onto the skin daily.   nicotine 7 mg/24hr patch Commonly known as:  NICODERM CQ Place 1 patch (7 mg total) onto the skin daily.   pravastatin 40 MG tablet Commonly known as:  PRAVACHOL Take 1 tablet (40 mg total) by mouth daily.       Major procedures and Radiology Reports - PLEASE review detailed and final reports for all details, in brief -   Echocardiogram 05/18/2018: Pending   Dg Chest 2 View  Result Date: 05/17/2018 CLINICAL DATA:  Chest pain, near syncope EXAM: CHEST - 2 VIEW COMPARISON:  None. FINDINGS: The heart size and mediastinal  contours are within normal  limits. Both lungs are clear. The visualized skeletal structures are unremarkable. IMPRESSION: No acute abnormality of the lungs.  No focal airspace opacity. Electronically Signed   By: Eddie Candle M.D.   On: 05/17/2018 16:31   Dg Chest Port 1 View  Result Date: 05/03/2018 CLINICAL DATA:  Palpitations EXAM: PORTABLE CHEST 1 VIEW COMPARISON:  None. FINDINGS: Suspect apical emphysema. There is no edema, consolidation, effusion, or pneumothorax. Normal heart size and mediastinal contours accounting for apical fat pad. IMPRESSION: 1. No acute finding. 2. Possible emphysema. Electronically Signed   By: Monte Fantasia M.D.   On: 05/03/2018 09:20    Micro Results   No results found for this or any previous visit (from the past 240 hour(s)).     Today   Subjective    Mohamud Mrozek today states that symptoms started prior to recent medication changes made by his primary care provider.   Objective   Blood pressure 137/84, pulse 84, temperature 98.1 F (36.7 C), temperature source Oral, resp. rate 18, height 6' (1.829 m), weight 100.3 kg, SpO2 98 %.  No intake or output data in the 24 hours ending 05/19/18 0657  Exam  Constitutional: NAD, calm, comfortable Eyes: PERRL, lids and conjunctivae normal ENMT: Mucous membranes are moist. Posterior pharynx clear of any exudate or lesions.Normal dentition.  Neck: normal, supple, no masses, no thyromegaly Respiratory: clear to auscultation bilaterally, no wheezing, no crackles. Normal respiratory effort. No accessory muscle use.  Cardiovascular: Regular rate and rhythm, no murmurs / rubs / gallops. No extremity edema. 2+ pedal pulses. No carotid bruits.  Abdomen: no tenderness, no masses palpated. No hepatosplenomegaly. Bowel sounds positive.  Musculoskeletal: no clubbing / cyanosis. No joint deformity upper and lower extremities. Good ROM, no contractures. Normal muscle tone.  Skin: no rashes, lesions, ulcers. No  induration Neurologic: CN 2-12 grossly intact. Sensation intact, DTR normal. Strength 5/5 in all 4.  Psychiatric: Normal judgment and insight. Alert and oriented x 3. Normal mood.    Data Review   CBC w Diff:  Lab Results  Component Value Date   WBC 5.0 05/18/2018   HGB 14.7 05/18/2018   HCT 43.9 05/18/2018   PLT 183 05/18/2018   LYMPHOPCT 16 05/03/2018   MONOPCT 13 05/03/2018   EOSPCT 1 05/03/2018   BASOPCT 1 05/03/2018    CMP:  Lab Results  Component Value Date   NA 136 05/18/2018   K 3.7 05/18/2018   CL 102 05/18/2018   CO2 25 05/18/2018   BUN 13 05/18/2018   CREATININE 1.03 05/18/2018   PROT 6.6 05/18/2018   ALBUMIN 4.0 05/18/2018   BILITOT 0.8 05/18/2018   ALKPHOS 54 05/18/2018   AST 26 05/18/2018   ALT 46 (H) 05/18/2018  .   Total Time in preparing paper work, data evaluation and todays exam - 35 minutes  Norval Morton M.D on 05/19/2018 at 6:57 AM  [facility]   Office  [phone]

## 2018-05-19 NOTE — Telephone Encounter (Signed)
Called patient. We were able to move up his event monitor appointment to this Friday 05/21/18.  Pt aware and in agreement with 10:30 am time, arrive at 10:15 am.  Aware of location.  He asked about echo results from hospital.  I was unable to provide any information re: this.

## 2018-05-19 NOTE — Telephone Encounter (Signed)
Patient evaluated by Dr. Angelena Form in the hospital.  So, I will also forward to him. Normal echo done in hospital is reassuring. Would proceed with stress testing and cardiac monitor as planned to further evaluate. Would limit/avoid caffeine, hydrate well and use caution if not feeling well. If symptoms worsen or he does pass out, then he should go back to the ED for urgent/emergent evaluation. Richardson Dopp, PA-C    05/19/2018 5:01 PM

## 2018-05-19 NOTE — Telephone Encounter (Addendum)
Spoke with pt's wife and pt is symptomatic once again. Pt is lightheaded and sweating reviewed all resulted tests and findings are normal at this time echo pending from yesterday Pt may return to ED or PMD for eval Attempted to reassure pt's wife and wife became angry and said will take husband to ED when passes out again and hung up ./cy

## 2018-05-21 ENCOUNTER — Ambulatory Visit (INDEPENDENT_AMBULATORY_CARE_PROVIDER_SITE_OTHER): Payer: Managed Care, Other (non HMO)

## 2018-05-21 DIAGNOSIS — R002 Palpitations: Secondary | ICD-10-CM

## 2018-05-25 ENCOUNTER — Ambulatory Visit (INDEPENDENT_AMBULATORY_CARE_PROVIDER_SITE_OTHER): Payer: Managed Care, Other (non HMO)

## 2018-05-25 DIAGNOSIS — R079 Chest pain, unspecified: Secondary | ICD-10-CM

## 2018-05-25 LAB — EXERCISE TOLERANCE TEST
CHL CUP MPHR: 164 {beats}/min
Estimated workload: 10.1 METS
Exercise duration (min): 9 min
Exercise duration (sec): 0 s
Peak HR: 155 {beats}/min
Percent HR: 94 %
RPE: 16
Rest HR: 76 {beats}/min

## 2018-06-02 ENCOUNTER — Other Ambulatory Visit: Payer: Self-pay

## 2018-06-02 ENCOUNTER — Encounter: Payer: Self-pay | Admitting: Family Medicine

## 2018-06-02 ENCOUNTER — Ambulatory Visit (INDEPENDENT_AMBULATORY_CARE_PROVIDER_SITE_OTHER): Payer: Managed Care, Other (non HMO) | Admitting: Family Medicine

## 2018-06-02 VITALS — BP 132/84 | HR 77 | Temp 97.9°F | Ht 72.0 in | Wt 218.8 lb

## 2018-06-02 DIAGNOSIS — E785 Hyperlipidemia, unspecified: Secondary | ICD-10-CM

## 2018-06-02 DIAGNOSIS — F17210 Nicotine dependence, cigarettes, uncomplicated: Secondary | ICD-10-CM | POA: Diagnosis not present

## 2018-06-02 DIAGNOSIS — I10 Essential (primary) hypertension: Secondary | ICD-10-CM | POA: Diagnosis not present

## 2018-06-02 DIAGNOSIS — R42 Dizziness and giddiness: Secondary | ICD-10-CM | POA: Insufficient documentation

## 2018-06-02 NOTE — Patient Instructions (Signed)

## 2018-06-02 NOTE — Assessment & Plan Note (Signed)
-  Quit smoking as of 3 weeks ago, congratulated and encouraged to stay quit.

## 2018-06-02 NOTE — Assessment & Plan Note (Signed)
-  BP much better controlled at this time, continue current medication.

## 2018-06-02 NOTE — Assessment & Plan Note (Signed)
-  Tolerating pravastatin well at this time.

## 2018-06-02 NOTE — Assessment & Plan Note (Signed)
-  Dizziness with ear pressure, ?Meniere's disease as no effusion noted on exam.  Referral to ENT.

## 2018-06-02 NOTE — Progress Notes (Signed)
Bryce King - 58 y.o. male MRN [id number]  Date of birth: [birth year]  Subjective Chief Complaint  Patient presents with  . Follow-up  . Hypertension    HPI Bryce King is a 59 y.o. male here today for follow up of HTN as well as recent ER visit.   -HTN:  Current treatment with lisinopril/hctz.  Doing well with current medication.  He denies side effects related to current medication including cough or symptoms of hypotension.  He has quit smoking as of 3 weeks ago.  He denies anginal symptoms, headache or vision changes.    -Dizziness:  Continues to have episodes of dizziness, sometimes feeling like he is going to pass out.  He has seen cardiology recently and had negative stress test, echo and holter monitor testing.  He continues to have these episodes and typically they are accompanied by feeling of fullness in the ears.  He denies ringing but reports hearing feels diminished from fullness.  He denies sinus pressure or congestion or headache   -HLD:  Started on pravastatin for hyperlipidemia and so far has not had any myalgias.  Doing well at this time.   ROS:  A comprehensive ROS was completed and negative except as noted per HPI  Allergies  Allergen Reactions  . Statins Other (See Comments)    Muscle aches    Past Medical History:  Diagnosis Date  . Hyperlipidemia   . Hypertension   . OSA (obstructive sleep apnea)    Uses CPAP    Past Surgical History:  Procedure Laterality Date  . ELBOW SURGERY Right 2005  . ROTATOR CUFF REPAIR Right 2007    Social History   Socioeconomic History  . Marital status: Married    Spouse name: Not on file  . Number of children: Not on file  . Years of education: Not on file  . Highest education level: Not on file  Occupational History  . Not on file  Social Needs  . Financial resource strain: Not on file  . Food insecurity:    Worry: Not on file    Inability: Not on file  . Transportation needs:    Medical: Not  on file    Non-medical: Not on file  Tobacco Use  . Smoking status: Current Every Day Smoker    Packs/day: 1.00    Types: Cigarettes  . Smokeless tobacco: Never Used  . Tobacco comment: quit 05/11/2018  Substance and Sexual Activity  . Alcohol use: Yes    Comment: weekly  . Drug use: Never  . Sexual activity: Not on file  Lifestyle  . Physical activity:    Days per week: Not on file    Minutes per session: Not on file  . Stress: Not on file  Relationships  . Social connections:    Talks on phone: Not on file    Gets together: Not on file    Attends religious service: Not on file    Active member of club or organization: Not on file    Attends meetings of clubs or organizations: Not on file    Relationship status: Not on file  Other Topics Concern  . Not on file  Social History Narrative  . Not on file    Family History  Problem Relation Age of Onset  . Cancer Father   . Depression Father   . Diabetes Father   . COPD Maternal Grandmother   . Heart attack Maternal Grandfather   . Heart attack Paternal  Grandfather     Health Maintenance  Topic Date Due  . Hepatitis C Screening  1960/12/19  . TETANUS/TDAP  07/06/1980  . COLONOSCOPY  07/07/2011  . INFLUENZA VACCINE  Completed  . HIV Screening  Completed    ----------------------------------------------------------------------------------------------------------------------------------------------------------------------------------------------------------------- Physical Exam BP 132/84 (BP Location: Left Arm, Patient Position: Sitting, Cuff Size: Normal)   Pulse 77   Temp 97.9 F (36.6 C) (Oral)   Ht 6' (1.829 m)   Wt 218 lb 12.8 oz (99.2 kg)   SpO2 96%   BMI 29.67 kg/m   Physical Exam Constitutional:      Appearance: Normal appearance.  HENT:     Head: Normocephalic and atraumatic.     Right Ear: Tympanic membrane normal.     Left Ear: Tympanic membrane normal.     Mouth/Throat:     Mouth: Mucous  membranes are moist.  Eyes:     General: No scleral icterus. Neck:     Musculoskeletal: Normal range of motion.  Cardiovascular:     Rate and Rhythm: Normal rate and regular rhythm.  Pulmonary:     Effort: Pulmonary effort is normal.     Breath sounds: Normal breath sounds.  Skin:    General: Skin is warm and dry.     Findings: No rash.  Neurological:     General: No focal deficit present.     Mental Status: He is alert.  Psychiatric:        Mood and Affect: Mood normal.        Behavior: Behavior normal.     ------------------------------------------------------------------------------------------------------------------------------------------------------------------------------------------------------------------- Assessment and Plan  Essential hypertension -BP much better controlled at this time, continue current medication.    Hyperlipidemia -Tolerating pravastatin well at this time.   Nicotine dependence -Quit smoking as of 3 weeks ago, congratulated and encouraged to stay quit.   Dizziness -Dizziness with ear pressure, ?Meniere's disease as no effusion noted on exam.  Referral to ENT.

## 2018-06-11 ENCOUNTER — Other Ambulatory Visit: Payer: Self-pay

## 2018-06-11 ENCOUNTER — Encounter: Payer: Self-pay | Admitting: Physician Assistant

## 2018-06-11 ENCOUNTER — Encounter: Payer: Self-pay | Admitting: Family Medicine

## 2018-06-11 ENCOUNTER — Ambulatory Visit (INDEPENDENT_AMBULATORY_CARE_PROVIDER_SITE_OTHER): Payer: Managed Care, Other (non HMO) | Admitting: Physician Assistant

## 2018-06-11 VITALS — BP 140/78 | HR 85 | Ht 72.0 in | Wt 226.8 lb

## 2018-06-11 DIAGNOSIS — I493 Ventricular premature depolarization: Secondary | ICD-10-CM

## 2018-06-11 DIAGNOSIS — I10 Essential (primary) hypertension: Secondary | ICD-10-CM

## 2018-06-11 DIAGNOSIS — R42 Dizziness and giddiness: Secondary | ICD-10-CM

## 2018-06-11 NOTE — Patient Instructions (Signed)
Medication Instructions:  No changes.  See your medication list.    If you need a refill on your cardiac medications before your next appointment, please call your pharmacy.   Lab work: None   If you have labs (blood work) drawn today and your tests are completely normal, you will receive your results only by: [name] MyChart Message (if you have MyChart) OR . A paper copy in the mail If you have any lab test that is abnormal or we need to change your treatment, we will call you to review the results.  Testing/Procedures: Complete your heart monitor. We will call you with the final results.   Follow-Up: At [facility], you and your health needs are our priority.  As part of our continuing mission to provide you with exceptional heart care, we have created designated Provider Care Teams.  These Care Teams include your primary Cardiologist (physician) and Advanced Practice Providers (APPs -  Physician Assistants and Nurse Practitioners) who all work together to provide you with the care you need, when you need it. You will need a follow up appointment in 3 months.  Please call our office 2 months in advance to schedule this appointment.  You may see Lauree Chandler, MD or Richardson Dopp, PA-C  Any Other Special Instructions Will Be Listed Below (If Applicable). Keep track of your blood pressure and bring the readings to your next appointment.  If you see the ENT, get diagnosed with Meniere's Disease and the treatment improves/resolves your symptoms, you can cancel your appointment with Cardiology.

## 2018-06-11 NOTE — Progress Notes (Signed)
Cardiology Office Note:    Date:  06/11/2018   ID:  Bryce King, DOB 05/03/1960, MRN [id number]  PCP:  Luetta Nutting, DO  Cardiologist:  Lauree Chandler, MD   Electrophysiologist:  None   Referring MD: Luetta Nutting, DO   Chief Complaint  Patient presents with  . Hospitalization Follow-up    admx with chest pain, dizziness     History of Present Illness:    Kishawn Pickar is a 58 y.o. male with hypertension, hyperlipidemia, sleep apnea, prior tobacco abuse.  He was admitted 2/17-2/18 with chest pain and dizziness.  He was noted to have PVCs on EKG.  Cardiac enzymes remain normal.  Echocardiogram demonstrated normal LV function.  Since discharge, he has undergone exercise treadmill test.  This was low risk.  An event monitor was placed and the results are pending.  He has followed up with primary care who has referred him to ENT for possible Mnire's disease.  Mr. Mclear returns for follow up.  He is here alone.  He has not had any chest pain, shortness of breath, syncope, paroxysmal nocturnal dyspnea, leg swelling.  He has episodes of dizziness associated with decreased hearing.  He sees otolaryngology next week.  He does not have palpitations that precede his symptoms.    Prior CV studies:   The following studies were reviewed today:  Exercise tolerance test 05/25/2018 ETT with good exercise tolerance (9:00); no chest pain; normal blood pressure response; no ST changes; negative adequate exercise tolerance test; Duke treadmill score 9.  Echocardiogram 05/18/2018 EF 55-60, normal wall motion, normal RVSF  Past Medical History:  Diagnosis Date  . Hyperlipidemia   . Hypertension   . OSA (obstructive sleep apnea)    Uses CPAP   Surgical Hx: The patient  has a past surgical history that includes Rotator cuff repair (Right, 2007) and Elbow surgery (Right, 2005).   Current Medications: Current Meds  Medication Sig  . buPROPion (WELLBUTRIN SR) 150 MG 12 hr  tablet Take 1 tablet (150 mg total) by mouth 2 (two) times daily. Start 150mg  daily x3 days then increase to BID  . lisinopril-hydrochlorothiazide (ZESTORETIC) 20-12.5 MG tablet Take 1 tablet by mouth daily.  . Melatonin 3 MG TABS Take 3 mg by mouth at bedtime.  . mometasone (ELOCON) 0.1 % cream Apply 1 application topically daily.  . Multiple Vitamin (MULTIVITAMIN WITH MINERALS) TABS tablet Take 1 tablet by mouth daily.  . nicotine (NICODERM CQ) 14 mg/24hr patch Place 1 patch (14 mg total) onto the skin daily.  . nicotine (NICODERM CQ) 21 mg/24hr patch Place 1 patch (21 mg total) onto the skin daily.  . nicotine (NICODERM CQ) 7 mg/24hr patch Place 1 patch (7 mg total) onto the skin daily.  [name] NITROGLYCERIN PO Take by mouth as needed.  . pravastatin (PRAVACHOL) 40 MG tablet Take 1 tablet (40 mg total) by mouth daily.     Allergies:   Statins   Social History   Tobacco Use  . Smoking status: Current Every Day Smoker    Packs/day: 1.00    Types: Cigarettes  . Smokeless tobacco: Never Used  . Tobacco comment: quit 05/11/2018  Substance Use Topics  . Alcohol use: Yes    Comment: weekly  . Drug use: Never     Family Hx: The patient's family history includes COPD in his maternal grandmother; Cancer in his father; Depression in his father; Diabetes in his father; Heart attack in his maternal grandfather and paternal grandfather.  ROS:  Please see the history of present illness.    Review of Systems  Neurological: Positive for dizziness and headaches.   All other systems reviewed and are negative.   EKGs/Labs/Other Test Reviewed:    EKG:  EKG is  ordered today.  The ekg ordered today demonstrates normal sinus rhythm, HR 85, normal axis, QTc 409, no changes.   Recent Labs: 05/03/2018: Magnesium 2.2 05/18/2018: ALT 46; B Natriuretic Peptide 7.3; BUN 13; Creatinine, Ser 1.03; Hemoglobin 14.7; Platelets 183; Potassium 3.7; Sodium 136; TSH 2.183   Recent Lipid Panel Lab Results   Component Value Date/Time   CHOL 244 (H) 05/05/2018 02:28 PM   TRIG 148.0 05/05/2018 02:28 PM   HDL 36.20 (L) 05/05/2018 02:28 PM   CHOLHDL 7 05/05/2018 02:28 PM   LDLCALC 178 (H) 05/05/2018 02:28 PM   From [facility]   Physical Exam:    VS:  BP 140/78   Pulse 85   Ht 6' (1.829 m)   Wt 226 lb 12.8 oz (102.9 kg)   SpO2 96%   BMI 30.76 kg/m     Wt Readings from Last 3 Encounters:  06/11/18 226 lb 12.8 oz (102.9 kg)  06/02/18 218 lb 12.8 oz (99.2 kg)  05/17/18 221 lb 1.9 oz (100.3 kg)     Physical Exam  Constitutional: He is oriented to person, place, and time. He appears well-developed and well-nourished. No distress.  HENT:  Head: Normocephalic and atraumatic.  Neck: Neck supple. No JVD present. Carotid bruit is not present.  Cardiovascular: Normal rate, regular rhythm, S1 normal and S2 normal.  No murmur heard. Pulmonary/Chest: Breath sounds normal. He has no rales.  Abdominal: Soft. There is no hepatomegaly.  Musculoskeletal:        General: No edema.  Neurological: He is alert and oriented to person, place, and time.  Skin: Skin is warm and dry.    ASSESSMENT & PLAN:    Dizziness Symptoms suspicious for Meniere's Disease.  He sees ENT next week.  No syncope.  No significant arrhythmias so far on his monitor.  ETT was normal.  Echo was also normal.    PVC's (premature ventricular contractions)   At this point, I suspect his PVCs are in response to his symptoms, not the cause.  His dizziness is quite scary for him.  We discussed +/- starting beta-blocker therapy.  I prefer he see ENT first.  If his workup is negative, we could try him on Metoprolol to see if this helps his symptoms.  Essential hypertension   BP above target.  He will continue to monitor.  If his diuretic is not changed with ENT, he will likely need his regimen adjusted for better blood pressure control.  The addition of a beta-blocker would help with his blood pressure and to control PVCs.      Dispo:  Return in about 3 months (around 09/11/2018) for Routine Follow Up w/ Dr. Angelena Form, or Richardson Dopp, PA-C.   Medication Adjustments/Labs and Tests Ordered: Current medicines are reviewed at length with the patient today.  Concerns regarding medicines are outlined above.  Tests Ordered: Orders Placed This Encounter  Procedures  . EKG 12-Lead   Medication Changes: No orders of the defined types were placed in this encounter.   Signed, Richardson Dopp, PA-C  06/11/2018 10:51 AM    [facility] [place], [place], [place]  [address] Phone: [phone]; Fax: [phone]

## 2018-06-13 NOTE — Telephone Encounter (Signed)
Ok to start 81mg  asa daily given risk factors of HTN and HLD

## 2018-06-17 ENCOUNTER — Encounter: Payer: Self-pay | Admitting: Family Medicine

## 2018-06-17 NOTE — Telephone Encounter (Signed)
His LDL was quite high with high risk of heart disease.  I would strongly recommend a statin to lower his cholesterol.  We can try  Rosuvastatin (crestor) 20mg  once per week.

## 2018-06-22 ENCOUNTER — Other Ambulatory Visit: Payer: Self-pay | Admitting: Family Medicine

## 2018-06-22 MED ORDER — EZETIMIBE 10 MG PO TABS: 10.0000 mg | ORAL_TABLET | Freq: Every day | ORAL | 1 refills | Status: DC

## 2018-06-22 NOTE — Progress Notes (Unsigned)
Called pt to report results from heart monitor. Pt verbalized understanding.

## 2018-07-08 ENCOUNTER — Telehealth: Payer: Self-pay | Admitting: *Deleted

## 2018-07-08 ENCOUNTER — Encounter: Payer: Self-pay | Admitting: *Deleted

## 2018-07-08 NOTE — Telephone Encounter (Signed)
Called patient and advised him that the [facility] e mail has been sent re: vidoe visit due to Covid 19. He had no questions regarding that. He stated he was unable to review EMR, requested to be called back in 35-40 minutes.

## 2018-07-08 NOTE — Addendum Note (Signed)
Addended by: Minna Antis on: 07/08/2018 01:14 PM   Modules accepted: Orders

## 2018-07-08 NOTE — Telephone Encounter (Signed)
Called patietn and updated EMR. He had no questions, verbalized understanding, appreciation.

## 2018-07-12 ENCOUNTER — Ambulatory Visit (INDEPENDENT_AMBULATORY_CARE_PROVIDER_SITE_OTHER): Payer: Managed Care, Other (non HMO) | Admitting: Diagnostic Neuroimaging

## 2018-07-12 ENCOUNTER — Telehealth: Payer: Self-pay | Admitting: Diagnostic Neuroimaging

## 2018-07-12 ENCOUNTER — Encounter: Payer: Self-pay | Admitting: Diagnostic Neuroimaging

## 2018-07-12 ENCOUNTER — Other Ambulatory Visit: Payer: Self-pay

## 2018-07-12 DIAGNOSIS — I10 Essential (primary) hypertension: Secondary | ICD-10-CM | POA: Diagnosis not present

## 2018-07-12 DIAGNOSIS — R42 Dizziness and giddiness: Secondary | ICD-10-CM

## 2018-07-12 NOTE — Telephone Encounter (Signed)
[employer] order sent to [facility]. They will obtain the auth and reach out to the pt to schedule.  °

## 2018-07-12 NOTE — Progress Notes (Addendum)
[facility]  PATIENT: Bryce King DOB: 1960-04-19  REFERRING CLINICIAN: Redmond Baseman, D  HISTORY FROM: patient  REASON FOR VISIT: new consult (via video)   HISTORICAL  CHIEF COMPLAINT:  Chief Complaint  Patient presents with  . Dizziness    lightheaded, faint feeling    HISTORY OF PRESENT ILLNESS:   58 year old male with hypertension, hyperlipidemia, smoking, alcohol use, sleep apnea, here for evaluation of lightheadedness and dizziness.  Early February 2020 patient had shoveled significant amount of gravel over several hours and was feeling tired.  He went inside and rested.  2 hours later he had tunnel vision, lightheadedness, faint feeling, chest pain and heart racing sensation.  He went to the emergency room was diagnosed with dehydration.  He was treated with fluids and discharged home.  Several days later he had recurrence of symptoms and therefore was admitted to the hospital.  Symptoms lasted for a few seconds initially and then 15 minutes and then 30 to 45 minutes.  Cardiac work-up was completed.  No significant abnormalities found.  Patient was encouraged to gradually reduce alcohol use and optimize his medical conditions.  Since that time patient has had a few more episodes of lightheadedness and dizziness.  He is also been to ENT for evaluation of possible Mnire's disease.  However patient does not have any vertigo or spinning sensation, tinnitus, and only has mild hearing loss changes.  No unilateral numbness, slurred speech, trouble talking.   REVIEW OF SYSTEMS: Full 14 system review of systems performed and negative with exception of: As per HPI.  ALLERGIES: Allergies  Allergen Reactions  . Statins Other (See Comments)    Muscle aches    HOME MEDICATIONS: Outpatient Medications Prior to Visit  Medication Sig Dispense Refill  . buPROPion (WELLBUTRIN SR) 150 MG 12 hr tablet Take 1 tablet (150 mg total) by mouth 2 (two) times daily. Start  150mg  daily x3 days then increase to BID 180 tablet 0  . ezetimibe (ZETIA) 10 MG tablet Take 1 tablet (10 mg total) by mouth daily. 90 tablet 1  . lisinopril-hydrochlorothiazide (ZESTORETIC) 20-12.5 MG tablet Take 1 tablet by mouth daily. 90 tablet 1  . Melatonin 3 MG TABS Take 3 mg by mouth at bedtime.    . mometasone (ELOCON) 0.1 % cream Apply 1 application topically daily. 45 g 0  . Multiple Vitamin (MULTIVITAMIN WITH MINERALS) TABS tablet Take 1 tablet by mouth daily.    [name] NITROGLYCERIN PO Take by mouth as needed.     No facility-administered medications prior to visit.     PAST MEDICAL HISTORY: Past Medical History:  Diagnosis Date  . Dizziness   . Hyperlipidemia   . Hypertension   . OSA (obstructive sleep apnea)    Uses CPAP    PAST SURGICAL HISTORY: Past Surgical History:  Procedure Laterality Date  . ELBOW SURGERY Right 2005  . ROTATOR CUFF REPAIR Right 2007    FAMILY HISTORY: Family History  Problem Relation Age of Onset  . Cancer Father   . Depression Father   . Diabetes Father   . COPD Maternal Grandmother   . Heart attack Maternal Grandfather   . Heart attack Paternal Grandfather     SOCIAL HISTORY: Social History   Socioeconomic History  . Marital status: Married    Spouse name: Not on file  . Number of children: 1  . Years of education: Not on file  . Highest education level: Some college, no degree  Occupational History  .  Not on file  Social Needs  . Financial resource strain: Not on file  . Food insecurity:    Worry: Not on file    Inability: Not on file  . Transportation needs:    Medical: Not on file    Non-medical: Not on file  Tobacco Use  . Smoking status: Former Smoker    Packs/day: 1.00    Types: Cigarettes    Last attempt to quit: 05/09/2018    Years since quitting: 0.1  . Smokeless tobacco: Never Used  . Tobacco comment: quit 05/11/2018  Substance and Sexual Activity  . Alcohol use: Yes    Comment: weekly  . Drug use: Never   . Sexual activity: Not on file  Lifestyle  . Physical activity:    Days per week: Not on file    Minutes per session: Not on file  . Stress: Not on file  Relationships  . Social connections:    Talks on phone: Not on file    Gets together: Not on file    Attends religious service: Not on file    Active member of club or organization: Not on file    Attends meetings of clubs or organizations: Not on file    Relationship status: Not on file  . Intimate partner violence:    Fear of current or ex partner: Not on file    Emotionally abused: Not on file    Physically abused: Not on file    Forced sexual activity: Not on file  Other Topics Concern  . Not on file  Social History Narrative   Lives with wife   Caffeine- coffee 2 cups, tea all day     PHYSICAL EXAM  Video visit  GENERAL EXAM/CONSTITUTIONAL:  Vitals: There were no vitals filed for this visit.  There is no height or weight on file to calculate BMI. Wt Readings from Last 3 Encounters:  06/11/18 226 lb 12.8 oz (102.9 kg)  06/02/18 218 lb 12.8 oz (99.2 kg)  05/17/18 221 lb 1.9 oz (100.3 kg)     Patient is in no distress; well developed, nourished and groomed; neck is supple   NEUROLOGIC: MENTAL STATUS:  No flowsheet data found.  awake, alert, oriented to person, place and time  recent and remote memory intact  normal attention and concentration  language fluent, comprehension intact, naming intact  fund of knowledge appropriate  CRANIAL NERVE:   2nd, 3rd, 4th, 6th - visual fields full to confrontation, extraocular muscles intact, no nystagmus  5th - facial sensation symmetric  7th - facial strength symmetric  8th - hearing intact  11th - shoulder shrug symmetric  12th - tongue protrusion midline  MOTOR:   NO TREMOR; NO DRIFT  COORDINATION:   fine finger movements normal      DIAGNOSTIC DATA (LABS, IMAGING, TESTING) - I reviewed patient records, labs, notes, testing and imaging  myself where available.  Lab Results  Component Value Date   WBC 5.0 05/18/2018   HGB 14.7 05/18/2018   HCT 43.9 05/18/2018   MCV 94.2 05/18/2018   PLT 183 05/18/2018      Component Value Date/Time   NA 136 05/18/2018 0509   K 3.7 05/18/2018 0509   CL 102 05/18/2018 0509   CO2 25 05/18/2018 0509   GLUCOSE 100 (H) 05/18/2018 0509   BUN 13 05/18/2018 0509   CREATININE 1.03 05/18/2018 0509   CALCIUM 9.0 05/18/2018 0509   PROT 6.6 05/18/2018 0509   ALBUMIN 4.0 05/18/2018 0509  AST 26 05/18/2018 0509   ALT 46 (H) 05/18/2018 0509   ALKPHOS 54 05/18/2018 0509   BILITOT 0.8 05/18/2018 0509   GFRNONAA >60 05/18/2018 0509   GFRAA >60 05/18/2018 0509   Lab Results  Component Value Date   CHOL 244 (H) 05/05/2018   HDL 36.20 (L) 05/05/2018   LDLCALC 178 (H) 05/05/2018   TRIG 148.0 05/05/2018   CHOLHDL 7 05/05/2018   Lab Results  Component Value Date   HGBA1C 5.7 05/05/2018   No results found for: VITAMINB12 Lab Results  Component Value Date   TSH 2.183 05/18/2018    05/25/18 exercise tolerance test -Blood pressure demonstrated a normal response to exercise. -There was no ST segment deviation noted during stress.  05/21/18 cardiac monitor - Sinus rhythm - Rare premature ventricular contractions    ASSESSMENT AND PLAN  58 y.o. year old male here with transient lightheadedness, dizziness, chest pain, palpitations, without specific triggering factors.  Symptoms occur in a seated position at rest.  Could be related to underlying vascular medical factors such as hypertension, hyperlipidemia, tobacco abuse and alcohol abuse.  Will check MRI brain and MRA head and neck to rule out other cerebrovascular etiologies.  Virtual Visit via Video Note  I connected with [name] on 07/12/18 at  2:30 PM EDT by a video enabled telemedicine application and verified that I am speaking with the correct person using two identifiers.   I discussed the limitations of evaluation  and management by telemedicine and the availability of in person appointments. The patient expressed understanding and agreed to proceed.  I discussed the assessment and treatment plan with the patient. The patient was provided an opportunity to ask questions and all were answered. The patient agreed with the plan and demonstrated an understanding of the instructions.   The patient was advised to call back or seek an in-person evaluation if the symptoms worsen or if the condition fails to improve as anticipated.  I provided 30 minutes of non-face-to-face time during this encounter.   Dx:  1. Dizziness   2. Lightheaded     PLAN:  - check MRI brain / MRA head / neck (dizziness eval; rule out vertebrobasilar insufficiency) - healthy habits reviewed; STOP SMOKING; GRADUALLY REDUCE ETOH  Orders Placed This Encounter  Procedures  . MR BRAIN W WO CONTRAST  . MR MRA HEAD WO CONTRAST  . MR MRA NECK W WO CONTRAST   Return PENDING TEST RESULTS, for pending if symptoms worsen or fail to improve.    Penni Bombard, MD 3/55/9741, 6:38 PM Certified in Neurology, Neurophysiology and Neuroimaging  [facility] [address] [place], [place] [address] [phone]

## 2018-07-14 NOTE — Telephone Encounter (Signed)
Novella Rob: [id number] (exp. 07/13/18 to 01/09/19) patient is scheduled at GI for 07/20/18

## 2018-07-20 ENCOUNTER — Other Ambulatory Visit: Payer: Self-pay

## 2018-07-20 ENCOUNTER — Ambulatory Visit
Admission: RE | Admit: 2018-07-20 | Discharge: 2018-07-20 | Disposition: A | Discharge status: Home / Self Care | Payer: Managed Care, Other (non HMO) | Source: Ambulatory Visit | Attending: Diagnostic Neuroimaging | Admitting: Diagnostic Neuroimaging

## 2018-07-20 DIAGNOSIS — R42 Dizziness and giddiness: Secondary | ICD-10-CM

## 2018-07-20 MED ORDER — GADOBENATE DIMEGLUMINE 529 MG/ML IV SOLN
20.0000 mL | Freq: Once | INTRAVENOUS | Status: AC | PRN
  Administered 2018-07-20: 20 mL via INTRAVENOUS

## 2018-07-21 ENCOUNTER — Encounter: Payer: Self-pay | Admitting: *Deleted

## 2018-07-21 ENCOUNTER — Telehealth: Payer: Self-pay | Admitting: *Deleted

## 2018-07-21 NOTE — Telephone Encounter (Signed)
Called patient and advised him that his MRA head and neck are unremarkable, MRI brain is unremarkable. Reviewed Dr Nikki Dom plan re: stop smoking, slowly reduce ETOH. He stated he quit smoking in Feb and has cut "way down on drinking, currently is only having 2 drinks a week". He stated that smoking and drinking cannot be causing his issues. He would like further information/ advice from Dr Leta Baptist.  I stated will let Dr Leta Baptist know and call him back. He  verbalized understanding, appreciation.

## 2018-07-21 NOTE — Telephone Encounter (Signed)
Called patient and advised him Dr Leta Baptist tried to reach him by phone. He stated that if the call came as restricted he doesn't answer. I informed him that Dr Leta Baptist stated neurologic etiologies for his issues were ruled out by MRA, MRI results. He recommended the patient follow up with his PCP and cardiology. He  verbalized understanding, appreciation.

## 2018-07-21 NOTE — Telephone Encounter (Signed)
I called patient x 3; no answer.   Neurologic etiologies ruled out by MRI / MRA.   Recommend to follow up with PCP and cardiology.   -VRP

## 2018-08-01 ENCOUNTER — Other Ambulatory Visit: Payer: Self-pay | Admitting: Family Medicine

## 2018-08-30 ENCOUNTER — Encounter: Payer: Self-pay | Admitting: Family Medicine

## 2018-08-30 ENCOUNTER — Ambulatory Visit: Payer: Managed Care, Other (non HMO) | Admitting: Diagnostic Neuroimaging

## 2018-08-31 NOTE — Telephone Encounter (Signed)
He doesn't meet criteria for testing through [facility] as he is asymptomatic.  Would refer him to the [facility], must call for appt.  [phone].

## 2018-09-07 ENCOUNTER — Telehealth: Payer: Self-pay | Admitting: *Deleted

## 2018-09-07 NOTE — Telephone Encounter (Signed)
Lvm on  cell to cal back about consent for virtual visit

## 2018-09-08 NOTE — Telephone Encounter (Signed)
Attempted to make virtual appointment. Patient was on conference call and said he will call back in about 40 minutes.

## 2018-09-09 NOTE — Telephone Encounter (Signed)
Follow up   Attempted to send a secure chat you were not available. Patient is returning your call.

## 2018-09-09 NOTE — Telephone Encounter (Signed)
Attempted to call patient back--no answer

## 2018-09-09 NOTE — Telephone Encounter (Signed)
Lvm for pt to call clinic back and set up for virtual office visit

## 2018-09-09 NOTE — Progress Notes (Signed)
Virtual Visit via Video Note   This visit type was conducted due to national recommendations for restrictions regarding the COVID-19 Pandemic (e.g. social distancing) in an effort to limit this patient's exposure and mitigate transmission in our community.  Due to his co-morbid illnesses, this patient is at least at moderate risk for complications without adequate follow up.  This format is felt to be most appropriate for this patient at this time.  All issues noted in this document were discussed and addressed.  A limited physical exam was performed with this format.  Please refer to the patient's chart for his consent to telehealth for [facility].   Date:  09/10/2018   ID:  Bryce King, DOB [birth year], MRN [id number]  Patient Location: Other:  Work Provider Location: Office  PCP:  Luetta Nutting, DO  Cardiologist:  Lauree Chandler, MD   Electrophysiologist:  None   Evaluation Performed:  Follow-Up Visit  Chief Complaint: Dizziness, palpitations  History of Present Illness:    Bryce King is a 58 y.o. male with:  Hypertension   Hyperlipidemia   Sleep apnea  Ex-smoker  PVCs  Dizziness  ETT 05/2018: normal  Echo 05/2018: EF 55-60  ENT w/u unermarkable  Neuro w/u unremarkable (MRI/MRA ok)  He was admitted in 05/2018 with dizziness.  ETT and Echocardiogram were both normal.  He saw ENT who did not feel he had an inner ear issue.  He was seen by neurology who felt there was no neurologic cause.  His MRI/MRA were unremarkable.  He was last seen 05/2018.  Since last seen, he started taking Zyrtec for allergies.  He has not had as much dizziness.  He has an occasional palpitation.  He denies chest pain, shortness of breath, syncope.  He has not had any fevers or chills.  The patient does not have symptoms concerning for COVID-19 infection (fever, chills, cough, or new shortness of breath).    Past Medical History:  Diagnosis Date  . Dizziness   .  Hyperlipidemia   . Hypertension   . OSA (obstructive sleep apnea)    Uses CPAP   Past Surgical History:  Procedure Laterality Date  . ELBOW SURGERY Right 2005  . ROTATOR CUFF REPAIR Right 2007     Current Meds  Medication Sig  . buPROPion (WELLBUTRIN SR) 150 MG 12 hr tablet Take 150 mg by mouth 2 (two) times daily.  [name] ezetimibe (ZETIA) 10 MG tablet Take 1 tablet (10 mg total) by mouth daily.  [name] lisinopril-hydrochlorothiazide (ZESTORETIC) 20-12.5 MG tablet Take 1 tablet by mouth daily.  . Melatonin 3 MG TABS Take 3 mg by mouth at bedtime.  . mometasone (ELOCON) 0.1 % cream Apply 1 application topically daily.  . Multiple Vitamin (MULTIVITAMIN WITH MINERALS) TABS tablet Take 1 tablet by mouth daily.  [name] NITROGLYCERIN PO Take by mouth as needed.     Allergies:   Statins   Social History   Tobacco Use  . Smoking status: Former Smoker    Packs/day: 1.00    Types: Cigarettes    Quit date: 05/09/2018    Years since quitting: 0.3  . Smokeless tobacco: Never Used  . Tobacco comment: quit 05/11/2018  Substance Use Topics  . Alcohol use: Yes    Comment: weekly, "07/21/18 2 drinks a week"   . Drug use: Never     Family Hx: The patient's family history includes COPD in his maternal grandmother; Cancer in his father; Depression in his father; Diabetes in his  father; Heart attack in his maternal grandfather and paternal grandfather.  ROS:   Please see the history of present illness.    All other systems reviewed and are negative.   Prior CV studies:   The following studies were reviewed today:  Event monitor 05/21/2018 NSR, rare PVCs  Exercise tolerance test 05/25/2018 ETT with good exercise tolerance (9:00); no chest pain; normal blood pressure response; no ST changes; negative adequate exercise tolerance test; Duke treadmill score 9.  Echocardiogram 05/18/2018 EF 55-60, normal wall motion, normal RVSF   Labs/Other Tests and Data Reviewed:    EKG:  No ECG reviewed.  Recent  Labs: 05/03/2018: Magnesium 2.2 05/18/2018: ALT 46; B Natriuretic Peptide 7.3; BUN 13; Creatinine, Ser 1.03; Hemoglobin 14.7; Platelets 183; Potassium 3.7; Sodium 136; TSH 2.183   Recent Lipid Panel Lab Results  Component Value Date/Time   CHOL 244 (H) 05/05/2018 02:28 PM   TRIG 148.0 05/05/2018 02:28 PM   HDL 36.20 (L) 05/05/2018 02:28 PM   CHOLHDL 7 05/05/2018 02:28 PM   LDLCALC 178 (H) 05/05/2018 02:28 PM    Wt Readings from Last 3 Encounters:  09/10/18 222 lb (100.7 kg)  06/11/18 226 lb 12.8 oz (102.9 kg)  06/02/18 218 lb 12.8 oz (99.2 kg)     Objective:    Vital Signs:  Ht 6' (1.829 m)   Wt 222 lb (100.7 kg)   BMI 30.11 kg/m    VITAL SIGNS:  reviewed GEN:  no acute distress EYES:  Sclera anicteric RESPIRATORY:  Normal respiratory effort NEURO:  Alert and oriented PSYCH:  normal affect  ASSESSMENT & PLAN:     PVC's (premature ventricular contractions) - Plan: His dizziness was all probably related to sinus congestion and allergies.  He had improvement on antihistamine therapy.  He has rare palpitations which I suspect are related to PVCs.  We discussed taking an as needed beta-blocker.  He would feel better if he had this on hand to use.  Therefore, I will give him propranolol 10 mg daily as needed for palpitations.  Otherwise, he can follow-up in 1 year or sooner if needed.  COVID-19 Education: The signs and symptoms of COVID-19 were discussed with the patient and how to seek care for testing (follow up with PCP or arrange E-visit).  The importance of social distancing was discussed today.  Time:   Today, I have spent 8 minutes with the patient with telehealth technology discussing the above problems.     Medication Adjustments/Labs and Tests Ordered: Current medicines are reviewed at length with the patient today.  Concerns regarding medicines are outlined above.   Tests Ordered: No orders of the defined types were placed in this encounter.   Medication  Changes: Meds ordered this encounter  Medications  . propranolol (INDERAL) 10 MG tablet    Sig: Take 1 tablet (10 mg total) by mouth daily as needed (for palpitations).    Dispense:  30 tablet    Refill:  1    Order Specific Question:   Supervising Provider    Answer:   Lelon Perla [[id number]]    Follow Up:  In Person in 1 year(s) with Richardson Dopp, PA-C or Lauree Chandler, MD   Signed, Richardson Dopp, PA-C  09/10/2018 10:38 AM    [place]

## 2018-09-10 ENCOUNTER — Encounter: Payer: Self-pay | Admitting: Physician Assistant

## 2018-09-10 ENCOUNTER — Ambulatory Visit (INDEPENDENT_AMBULATORY_CARE_PROVIDER_SITE_OTHER): Payer: Managed Care, Other (non HMO) | Admitting: Physician Assistant

## 2018-09-10 ENCOUNTER — Other Ambulatory Visit: Payer: Self-pay

## 2018-09-10 VITALS — Ht 72.0 in | Wt 222.0 lb

## 2018-09-10 DIAGNOSIS — R42 Dizziness and giddiness: Secondary | ICD-10-CM | POA: Diagnosis not present

## 2018-09-10 DIAGNOSIS — I10 Essential (primary) hypertension: Secondary | ICD-10-CM

## 2018-09-10 DIAGNOSIS — I493 Ventricular premature depolarization: Secondary | ICD-10-CM

## 2018-09-10 DIAGNOSIS — Z7189 Other specified counseling: Secondary | ICD-10-CM | POA: Diagnosis not present

## 2018-09-10 MED ORDER — PROPRANOLOL HCL 10 MG PO TABS: 10.0000 mg | ORAL_TABLET | Freq: Every day | ORAL | 1 refills | Status: DC | PRN

## 2018-09-10 NOTE — Patient Instructions (Signed)
Medication Instructions:  Take propranolol 10 mg as needed for palpitations  If you need a refill on your cardiac medications before your next appointment, please call your pharmacy.   Lab work: NONE ORDERED  TODAY   If you have labs (blood work) drawn today and your tests are completely normal, you will receive your results only by: [name] MyChart Message (if you have MyChart) OR . A paper copy in the mail If you have any lab test that is abnormal or we need to change your treatment, we will call you to review the results.  Testing/Procedures: NONE ORDERED  TODAY    Follow-Up: At [facility], you and your health needs are our priority.  As part of our continuing mission to provide you with exceptional heart care, we have created designated Provider Care Teams.  These Care Teams include your primary Cardiologist (physician) and Advanced Practice Providers (APPs -  Physician Assistants and Nurse Practitioners) who all work together to provide you with the care you need, when you need it. You will need a follow up appointment in 1 years.  Please call our office 2 months in advance to schedule this appointment.  You may see Lauree Chandler, MD or one of the following Advanced Practice Providers on your designated Care Team:   [place], PA-C [name], PA-C . Ermalinda Barrios, PA-C  Any Other Special Instructions Will Be Listed Below (If Applicable).

## 2018-09-10 NOTE — Telephone Encounter (Signed)

## 2018-09-10 NOTE — Telephone Encounter (Signed)
No message needed °

## 2018-10-30 ENCOUNTER — Other Ambulatory Visit: Payer: Self-pay | Admitting: Family Medicine

## 2018-11-01 ENCOUNTER — Other Ambulatory Visit: Payer: Self-pay | Admitting: Family Medicine

## 2018-11-25 ENCOUNTER — Other Ambulatory Visit: Payer: Self-pay

## 2018-11-25 ENCOUNTER — Telehealth (INDEPENDENT_AMBULATORY_CARE_PROVIDER_SITE_OTHER): Payer: Managed Care, Other (non HMO) | Admitting: Family Medicine

## 2018-11-25 ENCOUNTER — Encounter: Payer: Self-pay | Admitting: Family Medicine

## 2018-11-25 ENCOUNTER — Other Ambulatory Visit (INDEPENDENT_AMBULATORY_CARE_PROVIDER_SITE_OTHER): Payer: Managed Care, Other (non HMO)

## 2018-11-25 VITALS — Wt 225.0 lb

## 2018-11-25 DIAGNOSIS — Z79899 Other long term (current) drug therapy: Secondary | ICD-10-CM | POA: Diagnosis not present

## 2018-11-25 DIAGNOSIS — B351 Tinea unguium: Secondary | ICD-10-CM | POA: Diagnosis not present

## 2018-11-25 LAB — HEPATIC FUNCTION PANEL
ALT: 32 U/L (ref 0–53)
AST: 21 U/L (ref 0–37)
Albumin: 4.4 g/dL (ref 3.5–5.2)
Alkaline Phosphatase: 55 U/L (ref 39–117)
Bilirubin, Direct: 0.1 mg/dL (ref 0.0–0.3)
Total Bilirubin: 0.6 mg/dL (ref 0.2–1.2)
Total Protein: 6.9 g/dL (ref 6.0–8.3)

## 2018-11-25 NOTE — Assessment & Plan Note (Signed)
-  Discussed treatment options including prescription topicals vs oral antifungal.   -He would prefer to try oral antifungal.  Will get baseline LFT"s for terbinafine.  -Recommend that he change shoes as well to prevent reinfection after starting treatment.

## 2018-11-25 NOTE — Progress Notes (Signed)
Bryce King - 58 y.o. male MRN [id number]  Date of birth: [birth year]   This visit type was conducted due to national recommendations for restrictions regarding the COVID-19 Pandemic (e.g. social distancing).  This format is felt to be most appropriate for this patient at this time.  All issues noted in this document were discussed and addressed.  No physical exam was performed (except for noted visual exam findings with Video Visits).  I discussed the limitations of evaluation and management by telemedicine and the availability of in person appointments. The patient expressed understanding and agreed to proceed.  I connected with@ on 11/25/18 at  9:30 AM EDT by a video enabled telemedicine application and verified that I am speaking with the correct person using two identifiers.   Patient Location: home [address] [place] [address]   Provider location:   Home office  Chief Complaint  Patient presents with  . Nail Problem    nail fungus on fingers and toe nails/ comes and goes/few years/ gets trimmed out comes back    HPI  Bryce King is a 58 y.o. male who presents via audio/video conferencing for a telehealth visit today.  He has complaint today of nail fungus.   Reports several nails of feet and a couple of fingers are thickened, discolored and brittle.  He has tried several topical medications OTC without improvement.  He denies pain associated with this.     ROS:  A comprehensive ROS was completed and negative except as noted per HPI  Past Medical History:  Diagnosis Date  . Dizziness   . Hyperlipidemia   . Hypertension   . OSA (obstructive sleep apnea)    Uses CPAP    Past Surgical History:  Procedure Laterality Date  . ELBOW SURGERY Right 2005  . ROTATOR CUFF REPAIR Right 2007    Family History  Problem Relation Age of Onset  . Cancer Father   . Depression Father   . Diabetes Father   . COPD Maternal Grandmother   . Heart attack Maternal  Grandfather   . Heart attack Paternal Grandfather     Social History   Socioeconomic History  . Marital status: Married    Spouse name: Not on file  . Number of children: 1  . Years of education: Not on file  . Highest education level: Some college, no degree  Occupational History  . Not on file  Social Needs  . Financial resource strain: Not on file  . Food insecurity    Worry: Not on file    Inability: Not on file  . Transportation needs    Medical: Not on file    Non-medical: Not on file  Tobacco Use  . Smoking status: Former Smoker    Packs/day: 1.00    Types: Cigarettes    Quit date: 05/09/2018    Years since quitting: 0.5  . Smokeless tobacco: Never Used  . Tobacco comment: quit 05/11/2018  Substance and Sexual Activity  . Alcohol use: Yes    Comment: weekly, "07/21/18 2 drinks a week"   . Drug use: Never  . Sexual activity: Not on file  Lifestyle  . Physical activity    Days per week: Not on file    Minutes per session: Not on file  . Stress: Not on file  Relationships  . Social Herbalist on phone: Not on file    Gets together: Not on file    Attends religious service:  Not on file    Active member of club or organization: Not on file    Attends meetings of clubs or organizations: Not on file    Relationship status: Not on file  . Intimate partner violence    Fear of current or ex partner: Not on file    Emotionally abused: Not on file    Physically abused: Not on file    Forced sexual activity: Not on file  Other Topics Concern  . Not on file  Social History Narrative   Lives with wife   Caffeine- coffee 2 cups, tea all day     Current Outpatient Medications:  .  buPROPion (WELLBUTRIN SR) 150 MG 12 hr tablet, TAKE 1 TABLET DAILY X3 DAYS THEN INCREASE TO 1 TABLET TWICE A DAY, Disp: 180 tablet, Rfl: 0 .  ezetimibe (ZETIA) 10 MG tablet, Take 1 tablet (10 mg total) by mouth daily., Disp: 90 tablet, Rfl: 1 .  lisinopril-hydrochlorothiazide  (ZESTORETIC) 20-12.5 MG tablet, TAKE 1 TABLET BY MOUTH EVERY DAY, Disp: 90 tablet, Rfl: 1 .  Melatonin 3 MG TABS, Take 3 mg by mouth at bedtime., Disp: , Rfl:  .  mometasone (ELOCON) 0.1 % cream, Apply 1 application topically daily., Disp: 45 g, Rfl: 0 .  Multiple Vitamin (MULTIVITAMIN WITH MINERALS) TABS tablet, Take 1 tablet by mouth daily., Disp: , Rfl:  .  propranolol (INDERAL) 10 MG tablet, Take 1 tablet (10 mg total) by mouth daily as needed (for palpitations)., Disp: 30 tablet, Rfl: 1 .  NITROGLYCERIN PO, Take by mouth as needed., Disp: , Rfl:   EXAM:  VITALS per patient if applicable: Wt 225 lb (102.1 kg)   BMI 30.52 kg/m   GENERAL: alert, oriented, appears well and in no acute distress  HEENT: atraumatic, conjunttiva clear, no obvious abnormalities on inspection of external nose and ears  NECK: normal movements of the head and neck  LUNGS: on inspection no signs of respiratory distress, breathing rate appears normal, no obvious gross SOB, gasping or wheezing  CV: no obvious cyanosis  MS: moves all visible extremities without noticeable abnormality  PSYCH/NEURO: pleasant and cooperative, no obvious depression or anxiety, speech and thought processing grossly intact  ASSESSMENT AND PLAN:  Discussed the following assessment and plan:  Onychomycosis -Discussed treatment options including prescription topicals vs oral antifungal.   -He would prefer to try oral antifungal.  Will get baseline LFT"s for terbinafine.  -Recommend that he change shoes as well to prevent reinfection after starting treatment.        I discussed the assessment and treatment plan with the patient. The patient was provided an opportunity to ask questions and all were answered. The patient agreed with the plan and demonstrated an understanding of the instructions.   The patient was advised to call back or seek an in-person evaluation if the symptoms worsen or if the condition fails to improve as  anticipated.    Luetta Nutting, DO

## 2018-11-26 ENCOUNTER — Other Ambulatory Visit: Payer: Self-pay | Admitting: Family Medicine

## 2018-11-26 MED ORDER — TERBINAFINE HCL 250 MG PO TABS: 250.0000 mg | ORAL_TABLET | Freq: Every day | ORAL | 0 refills | Status: AC

## 2019-01-04 ENCOUNTER — Other Ambulatory Visit: Payer: Self-pay | Admitting: Family Medicine

## 2019-01-24 ENCOUNTER — Other Ambulatory Visit: Payer: Self-pay | Admitting: Family Medicine

## 2019-04-28 ENCOUNTER — Other Ambulatory Visit: Payer: Self-pay | Admitting: Family Medicine

## 2019-06-01 ENCOUNTER — Telehealth: Payer: Self-pay | Admitting: Family Medicine

## 2019-06-01 NOTE — Telephone Encounter (Signed)
I called pt about message sent in:  Appointment Request From: Bryce King    With Provider: Luetta Nutting, DO [[facility]]    Preferred Date Range: Any    Preferred Times: Any Time    Reason for visit: Annual Physical    Comments:  Check up   Pt said he will follow [name] to [facility]

## 2019-06-06 ENCOUNTER — Ambulatory Visit (INDEPENDENT_AMBULATORY_CARE_PROVIDER_SITE_OTHER): Payer: Managed Care, Other (non HMO) | Admitting: Family Medicine

## 2019-06-06 ENCOUNTER — Other Ambulatory Visit: Payer: Self-pay

## 2019-06-06 ENCOUNTER — Encounter: Payer: Self-pay | Admitting: Family Medicine

## 2019-06-06 VITALS — BP 151/87 | HR 91 | Temp 97.9°F | Ht 72.0 in | Wt 229.0 lb

## 2019-06-06 DIAGNOSIS — R7309 Other abnormal glucose: Secondary | ICD-10-CM

## 2019-06-06 DIAGNOSIS — R339 Retention of urine, unspecified: Secondary | ICD-10-CM

## 2019-06-06 DIAGNOSIS — E785 Hyperlipidemia, unspecified: Secondary | ICD-10-CM | POA: Diagnosis not present

## 2019-06-06 DIAGNOSIS — M25511 Pain in right shoulder: Secondary | ICD-10-CM

## 2019-06-06 DIAGNOSIS — Z0000 Encounter for general adult medical examination without abnormal findings: Secondary | ICD-10-CM

## 2019-06-06 DIAGNOSIS — I10 Essential (primary) hypertension: Secondary | ICD-10-CM

## 2019-06-06 DIAGNOSIS — F17210 Nicotine dependence, cigarettes, uncomplicated: Secondary | ICD-10-CM

## 2019-06-06 MED ORDER — LISINOPRIL-HYDROCHLOROTHIAZIDE 20-12.5 MG PO TABS: 2.0000 | ORAL_TABLET | Freq: Every day | ORAL | 1 refills | Status: DC

## 2019-06-06 NOTE — Assessment & Plan Note (Signed)
Blood pressure is not at goal at for age and co-morbidities.  I recommend he increase lisinopril/hctz to 2 tabs daily.  Continue propranolol at current dose.  In addition they were instructed to follow a low sodium diet with regular exercise to help to maintain adequate control of blood pressure.

## 2019-06-06 NOTE — Assessment & Plan Note (Signed)
He has quit smoking.  Encouraged to remain quit.

## 2019-06-06 NOTE — Progress Notes (Signed)
Bryce King - 59 y.o. male MRN [id number]  Date of birth: 30-Oct-1960  Subjective Chief Complaint  Patient presents with  . Annual Exam    HPI Bryce King is a 59 y.o. male with history of HTN, HLD and nicotine dependence here today for annual exam.  He reports that he has had some R shoulder pain for the past few months.  Worsened over the past couple of weeks.  He has had prior rotator cuff repair on this shoulder.  He does not recall any known injury of this shoulder recently. He denies numbness/tingling.   He has noted that BP has been elevated at home recently.  He is compliant with propranolol and lisinopril/hctz.  He denies symptoms related to BP including anginal symptoms, headache shortness of breath or vision changes.   He has quit smoking.  He consumes EtOH a couple of times per week.   He stays active around his home and walks for exercise.  He feels that he follows a healthy diet.   Review of Systems  Constitutional: Negative for chills, fever, malaise/fatigue and weight loss.  HENT: Negative for congestion, ear pain and sore throat.   Eyes: Negative for blurred vision, double vision and pain.  Respiratory: Negative for cough and shortness of breath.   Cardiovascular: Negative for chest pain and palpitations.  Gastrointestinal: Negative for abdominal pain, blood in stool, constipation, heartburn and nausea.  Genitourinary: Negative for dysuria and urgency.  Musculoskeletal: Positive for joint pain. Negative for myalgias.  Neurological: Negative for dizziness and headaches.  Endo/Heme/Allergies: Does not bruise/bleed easily.  Psychiatric/Behavioral: Negative for depression. The patient is not nervous/anxious and does not have insomnia.     Allergies  Allergen Reactions  . Statins Other (See Comments)    Muscle aches    Past Medical History:  Diagnosis Date  . Dizziness   . Hyperlipidemia   . Hypertension   . OSA (obstructive sleep apnea)    Uses  CPAP    Past Surgical History:  Procedure Laterality Date  . ELBOW SURGERY Right 2005  . ROTATOR CUFF REPAIR Right 2007    Social History   Socioeconomic History  . Marital status: Married    Spouse name: Not on file  . Number of children: 1  . Years of education: Not on file  . Highest education level: Some college, no degree  Occupational History  . Not on file  Tobacco Use  . Smoking status: Former Smoker    Packs/day: 1.00    Types: Cigarettes    Quit date: 05/09/2018    Years since quitting: 1.0  . Smokeless tobacco: Never Used  . Tobacco comment: quit 05/11/2018  Substance and Sexual Activity  . Alcohol use: Yes    Comment: weekly, "07/21/18 2 drinks a week"   . Drug use: Never  . Sexual activity: Not on file  Other Topics Concern  . Not on file  Social History Narrative   Lives with wife   Caffeine- coffee 2 cups, tea all day   Social Determinants of Health   Financial Resource Strain:   . Difficulty of Paying Living Expenses: Not on file  Food Insecurity:   . Worried About Charity fundraiser in the Last Year: Not on file  . Ran Out of Food in the Last Year: Not on file  Transportation Needs:   . Lack of Transportation (Medical): Not on file  . Lack of Transportation (Non-Medical): Not on file  Physical Activity:   .  Days of Exercise per Week: Not on file  . Minutes of Exercise per Session: Not on file  Stress:   . Feeling of Stress : Not on file  Social Connections:   . Frequency of Communication with Friends and Family: Not on file  . Frequency of Social Gatherings with Friends and Family: Not on file  . Attends Religious Services: Not on file  . Active Member of Clubs or Organizations: Not on file  . Attends Archivist Meetings: Not on file  . Marital Status: Not on file    Family History  Problem Relation Age of Onset  . Cancer Father   . Depression Father   . Diabetes Father   . COPD Maternal Grandmother   . Heart attack Maternal  Grandfather   . Heart attack Paternal Grandfather     Health Maintenance  Topic Date Due  . Hepatitis C Screening  05/19/1960  . TETANUS/TDAP  07/06/1980  . COLONOSCOPY  07/07/2011  . INFLUENZA VACCINE  Completed  . HIV Screening  Completed     ----------------------------------------------------------------------------------------------------------------------------------------------------------------------------------------------------------------- Physical Exam BP (!) 151/87   Pulse 91   Temp 97.9 F (36.6 C) (Oral)   Ht 6' (1.829 m)   Wt 229 lb (103.9 kg)   BMI 31.06 kg/m   Physical Exam Constitutional:      General: He is not in acute distress. HENT:     Head: Normocephalic and atraumatic.     Right Ear: External ear normal.     Left Ear: External ear normal.  Eyes:     General: No scleral icterus. Neck:     Thyroid: No thyromegaly.  Cardiovascular:     Rate and Rhythm: Normal rate and regular rhythm.     Heart sounds: Normal heart sounds.  Pulmonary:     Effort: Pulmonary effort is normal.     Breath sounds: Normal breath sounds.  Abdominal:     General: Bowel sounds are normal. There is no distension.     Palpations: Abdomen is soft.     Tenderness: There is no abdominal tenderness. There is no guarding.  Musculoskeletal:     Cervical back: Normal range of motion.     Comments: R shoulder with ttp along lateral humeral head.  Pain with abduction and flexion.  Mild pain with impingement testing. Mild weakness with empty can. Testing of additional rotator cuff muscles is normal.   Lymphadenopathy:     Cervical: No cervical adenopathy.  Skin:    General: Skin is warm and dry.     Findings: No rash.  Neurological:     Mental Status: He is alert and oriented to person, place, and time.     Cranial Nerves: No cranial nerve deficit.     Motor: No abnormal muscle tone.  Psychiatric:        Behavior: Behavior normal.      ------------------------------------------------------------------------------------------------------------------------------------------------------------------------------------------------------------------- Assessment and Plan  Essential hypertension Blood pressure is not at goal at for age and co-morbidities.  I recommend he increase lisinopril/hctz to 2 tabs daily.  Continue propranolol at current dose.  In addition they were instructed to follow a low sodium diet with regular exercise to help to maintain adequate control of blood pressure.    Nicotine dependence He has quit smoking.  Encouraged to remain quit.   Hyperlipidemia Update lipid panel.   Well adult exam Well adult Orders Placed This Encounter  Procedures  . COMPLETE METABOLIC PANEL WITH GFR  . CBC  . Lipid Profile  .  TSH  . PSA  . HgB A1c  . Ambulatory referral to Sports Medicine    Referral Priority:   Routine    Referral Type:   Consultation    Number of Visits Requested:   1  Screening: Due for colonoscopy.  He will let me know if he decides to have colonoscopy or cologuard. Immunizations: Recently had Shingles vaccine, plans to have COVID vaccine Anticipatory guidance/Risk Factor reduction:  Recommendations per AVS.   Right shoulder pain Sports med referral entered.    Meds ordered this encounter  Medications  . lisinopril-hydrochlorothiazide (ZESTORETIC) 20-12.5 MG tablet    Sig: Take 2 tablets by mouth daily.    Dispense:  180 tablet    Refill:  1    Return in about 6 months (around 12/07/2019) for HTN.    This visit occurred during the SARS-CoV-2 public health emergency.  Safety protocols were in place, including screening questions prior to the visit, additional usage of staff PPE, and extensive cleaning of exam room while observing appropriate contact time as indicated for disinfecting solutions.

## 2019-06-06 NOTE — Assessment & Plan Note (Signed)
Sports med referral entered.

## 2019-06-06 NOTE — Assessment & Plan Note (Signed)
Update lipid panel.  

## 2019-06-06 NOTE — Patient Instructions (Addendum)
Great to see you today! Increase lisinopril/hctz to 2 tablets each day.  Continue other medications.  See me again in 6 months for BP follow up.    Preventive Care 76-58 Years Old, Male Preventive care refers to lifestyle choices and visits with your health care provider that can promote health and wellness. This includes:  A yearly physical exam. This is also called an annual well check.  Regular dental and eye exams.  Immunizations.  Screening for certain conditions.  Healthy lifestyle choices, such as eating a healthy diet, getting regular exercise, not using drugs or products that contain nicotine and tobacco, and limiting alcohol use. What can I expect for my preventive care visit? Physical exam Your health care provider will check:  Height and weight. These may be used to calculate body mass index (BMI), which is a measurement that tells if you are at a healthy weight.  Heart rate and blood pressure.  Your skin for abnormal spots. Counseling Your health care provider may ask you questions about:  Alcohol, tobacco, and drug use.  Emotional well-being.  Home and relationship well-being.  Sexual activity.  Eating habits.  Work and work Statistician. What immunizations do I need?  Influenza (flu) vaccine  This is recommended every year. Tetanus, diphtheria, and pertussis (Tdap) vaccine  You may need a Td booster every 10 years. Varicella (chickenpox) vaccine  You may need this vaccine if you have not already been vaccinated. Zoster (shingles) vaccine  You may need this after age 79. Measles, mumps, and rubella (MMR) vaccine  You may need at least one dose of MMR if you were born in 1957 or later. You may also need a second dose. Pneumococcal conjugate (PCV13) vaccine  You may need this if you have certain conditions and were not previously vaccinated. Pneumococcal polysaccharide (PPSV23) vaccine  You may need one or two doses if you smoke cigarettes or  if you have certain conditions. Meningococcal conjugate (MenACWY) vaccine  You may need this if you have certain conditions. Hepatitis A vaccine  You may need this if you have certain conditions or if you travel or work in places where you may be exposed to hepatitis A. Hepatitis B vaccine  You may need this if you have certain conditions or if you travel or work in places where you may be exposed to hepatitis B. Haemophilus influenzae type b (Hib) vaccine  You may need this if you have certain risk factors. Human papillomavirus (HPV) vaccine  If recommended by your health care provider, you may need three doses over 6 months. You may receive vaccines as individual doses or as more than one vaccine together in one shot (combination vaccines). Talk with your health care provider about the risks and benefits of combination vaccines. What tests do I need? Blood tests  Lipid and cholesterol levels. These may be checked every 5 years, or more frequently if you are over 3 years old.  Hepatitis C test.  Hepatitis B test. Screening  Lung cancer screening. You may have this screening every year starting at age 42 if you have a 30-pack-year history of smoking and currently smoke or have quit within the past 15 years.  Prostate cancer screening. Recommendations will vary depending on your family history and other risks.  Colorectal cancer screening. All adults should have this screening starting at age 12 and continuing until age 59. Your health care provider may recommend screening at age 8 if you are at increased risk. You will have tests  every 1-10 years, depending on your results and the type of screening test.  Diabetes screening. This is done by checking your blood sugar (glucose) after you have not eaten for a while (fasting). You may have this done every 1-3 years.  Sexually transmitted disease (STD) testing. Follow these instructions at home: Eating and drinking  Eat a diet  that includes fresh fruits and vegetables, whole grains, lean protein, and low-fat dairy products.  Take vitamin and mineral supplements as recommended by your health care provider.  Do not drink alcohol if your health care provider tells you not to drink.  If you drink alcohol: ? Limit how much you have to 0-2 drinks a day. ? Be aware of how much alcohol is in your drink. In the [place] one drink equals one 12 oz bottle of beer (355 mL), one 5 oz glass of wine (148 mL), or one 1 oz glass of hard liquor (44 mL). Lifestyle  Take daily care of your teeth and gums.  Stay active. Exercise for at least 30 minutes on 5 or more days each week.  Do not use any products that contain nicotine or tobacco, such as cigarettes, e-cigarettes, and chewing tobacco. If you need help quitting, ask your health care provider.  If you are sexually active, practice safe sex. Use a condom or other form of protection to prevent STIs (sexually transmitted infections).  Talk with your health care provider about taking a low-dose aspirin every day starting at age 42. What's next?  Go to your health care provider once a year for a well check visit.  Ask your health care provider how often you should have your eyes and teeth checked.  Stay up to date on all vaccines. This information is not intended to replace advice given to you by your health care provider. Make sure you discuss any questions you have with your health care provider. Document Revised: 03/11/2018 Document Reviewed: 03/11/2018 Elsevier Patient Education  2020 [employer].

## 2019-06-06 NOTE — Progress Notes (Signed)
Patient reports elevated BP reading at 06:30pm on 06/04/2019 of 165/81.  06/05/2019 elevated BP reading of 161/89 at 04:00 pm.

## 2019-06-06 NOTE — Assessment & Plan Note (Signed)
Well adult Orders Placed This Encounter  Procedures  . COMPLETE METABOLIC PANEL WITH GFR  . CBC  . Lipid Profile  . TSH  . PSA  . HgB A1c  . Ambulatory referral to Sports Medicine    Referral Priority:   Routine    Referral Type:   Consultation    Number of Visits Requested:   1  Screening: Due for colonoscopy.  He will let me know if he decides to have colonoscopy or cologuard. Immunizations: Recently had Shingles vaccine, plans to have COVID vaccine Anticipatory guidance/Risk Factor reduction:  Recommendations per AVS.

## 2019-06-07 LAB — COMPLETE METABOLIC PANEL WITH GFR
AG Ratio: 1.7 (calc) (ref 1.0–2.5)
ALT: 36 U/L (ref 9–46)
AST: 26 U/L (ref 10–35)
Albumin: 4.4 g/dL (ref 3.6–5.1)
Alkaline phosphatase (APISO): 63 U/L (ref 35–144)
BUN: 14 mg/dL (ref 7–25)
CO2: 28 mmol/L (ref 20–32)
Calcium: 9.6 mg/dL (ref 8.6–10.3)
Chloride: 102 mmol/L (ref 98–110)
Creat: 0.82 mg/dL (ref 0.70–1.33)
GFR, Est African American: 114 mL/min/{1.73_m2} (ref >=60)
GFR, Est Non African American: 98 mL/min/{1.73_m2} (ref >=60)
Globulin: 2.6 g/dL (calc) (ref 1.9–3.7)
Glucose, Bld: 97 mg/dL (ref 65–99)
Potassium: 4 mmol/L (ref 3.5–5.3)
Sodium: 137 mmol/L (ref 135–146)
Total Bilirubin: 0.6 mg/dL (ref 0.2–1.2)
Total Protein: 7 g/dL (ref 6.1–8.1)

## 2019-06-07 LAB — LIPID PANEL
Cholesterol: 204 mg/dL — ABNORMAL HIGH (ref <200)
HDL: 41 mg/dL (ref >=40)
LDL Cholesterol (Calc): 142 mg/dL (calc) — ABNORMAL HIGH
Non-HDL Cholesterol (Calc): 163 mg/dL (calc) — ABNORMAL HIGH (ref <130)
Total CHOL/HDL Ratio: 5 (calc) — ABNORMAL HIGH (ref <5.0)
Triglycerides: 97 mg/dL (ref <150)

## 2019-06-07 LAB — CBC
HCT: 43.6 % (ref 38.5–50.0)
Hemoglobin: 14.8 g/dL (ref 13.2–17.1)
MCH: 32 pg (ref 27.0–33.0)
MCHC: 33.9 g/dL (ref 32.0–36.0)
MCV: 94.4 fL (ref 80.0–100.0)
MPV: 9.6 fL (ref 7.5–12.5)
Platelets: 194 10*3/uL (ref 140–400)
RBC: 4.62 10*6/uL (ref 4.20–5.80)
RDW: 12.3 % (ref 11.0–15.0)
WBC: 4 10*3/uL (ref 3.8–10.8)

## 2019-06-07 LAB — PSA: PSA: 1.7 ng/mL (ref <=4.0)

## 2019-06-07 LAB — HEMOGLOBIN A1C
Hgb A1c MFr Bld: 5.6 % of total Hgb (ref <5.7)
Mean Plasma Glucose: 114 (calc)
eAG (mmol/L): 6.3 (calc)

## 2019-06-07 LAB — TSH: TSH: 1.44 mIU/L (ref 0.40–4.50)

## 2019-06-15 ENCOUNTER — Ambulatory Visit (INDEPENDENT_AMBULATORY_CARE_PROVIDER_SITE_OTHER): Payer: Managed Care, Other (non HMO) | Admitting: Sports Medicine

## 2019-06-15 ENCOUNTER — Ambulatory Visit (INDEPENDENT_AMBULATORY_CARE_PROVIDER_SITE_OTHER): Payer: Managed Care, Other (non HMO)

## 2019-06-15 ENCOUNTER — Other Ambulatory Visit: Payer: Self-pay

## 2019-06-15 ENCOUNTER — Encounter: Payer: Self-pay | Admitting: Sports Medicine

## 2019-06-15 DIAGNOSIS — M25511 Pain in right shoulder: Secondary | ICD-10-CM

## 2019-06-15 NOTE — Assessment & Plan Note (Addendum)
Bryce King has a long history of right shoulder pain, clinically this resembles rotator cuff dysfunction today. He tells me he has a history of rotator cuff repair on the right in the distant past. Because this is waking her from sleep we are going to proceed with a subacromial injection, x-rays. 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 I would like him to do some formal physical therapy, I explained the function of the rotator cuff and the necessity of PT. I also explained to Bryce King that even with a full-thickness retracted rotator cuff tear it was possible to rehab through. Return to see me in 6 weeks.

## 2019-06-15 NOTE — Progress Notes (Signed)
    Procedures performed today:    Procedure: Real-time Ultrasound Guided injection of the right subacromial bursa Device: Samsung HS60  Verbal informed consent obtained.  Time-out conducted.  Noted no overlying erythema, induration, or other signs of local infection.  Skin prepped in a sterile fashion.  Local anesthesia: Topical Ethyl chloride.  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 Completed without difficulty  Pain immediately resolved suggesting accurate placement of the medication.  Advised to call if fevers/chills, erythema, induration, drainage, or persistent bleeding.  Images permanently stored and available for review in the ultrasound unit.  Impression: Technically successful ultrasound guided injection.  Independent interpretation of notes and tests performed by another provider:   None.  Impression and Recommendations:    Right shoulder pain Eidan has a long history of right shoulder pain, clinically this resembles rotator cuff dysfunction today. He tells me he has a history of rotator cuff repair on the right in the distant past. Because this is waking her from sleep we are going to proceed with a subacromial injection, x-rays. 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 I would like him to do some formal physical therapy, I explained the function of the rotator cuff and the necessity of PT. I also explained to Cecilia that even with a full-thickness retracted rotator cuff tear it was possible to rehab through. Return to see me in 6 weeks.    ___________________________________________ Gwen Her. Dianah Field, M.D., ABFM., CAQSM. Primary Care and [place] Instructor of [place] of [facility] of Medicine

## 2019-06-17 ENCOUNTER — Ambulatory Visit (INDEPENDENT_AMBULATORY_CARE_PROVIDER_SITE_OTHER): Payer: Managed Care, Other (non HMO) | Admitting: Sports Medicine

## 2019-06-17 ENCOUNTER — Other Ambulatory Visit: Payer: Self-pay

## 2019-06-17 ENCOUNTER — Ambulatory Visit (INDEPENDENT_AMBULATORY_CARE_PROVIDER_SITE_OTHER): Payer: Managed Care, Other (non HMO)

## 2019-06-17 DIAGNOSIS — S4991XA Unspecified injury of right shoulder and upper arm, initial encounter: Secondary | ICD-10-CM | POA: Diagnosis not present

## 2019-06-17 DIAGNOSIS — M25511 Pain in right shoulder: Secondary | ICD-10-CM | POA: Diagnosis not present

## 2019-06-17 MED ORDER — HYDROCODONE-ACETAMINOPHEN 10-325 MG PO TABS: 1.0000 | ORAL_TABLET | Freq: Three times a day (TID) | ORAL | 0 refills | Status: DC | PRN

## 2019-06-17 NOTE — Assessment & Plan Note (Signed)
Price returns, he had a long history of right shoulder pain, history of rotator cuff repair in the past. I did an ultrasound-guided subacromial injection at the last visit 2 days ago, I did see what appeared to be a full-thickness or near full-thickness tear of his supraspinatus. He was doing extremely well, sleeping through the night after the shot until he was walking his dog and tripped, he fell and his right arm was jerked into extension. He had immediate and severe pain. On exam he has significant weakness and pain to abduction as well as external rotation. X-rays reviewed, he has a high riding humeral head consistent with rotator cuff deficiency, as well as widespread osteoarthritis but no obvious fractures. Because of the acuteness of the injury we are going to place him in a sling with some hydrocodone, and give it 1 to 2 weeks, if insufficient improvement or return to baseline then we will proceed with MRI.

## 2019-06-17 NOTE — Progress Notes (Signed)
    Procedures performed today:    None.  Independent interpretation of notes and tests performed by another provider:   None.  Impression and Recommendations:    Right shoulder pain Bryce King, he had a long history of right shoulder pain, history of rotator cuff repair in the past. I did an ultrasound-guided subacromial injection at the last visit 2 days ago, I did see what appeared to be a full-thickness or near full-thickness tear of his supraspinatus. He was doing extremely well, sleeping through the night after the shot until he was walking his dog and tripped, he fell and his right arm was jerked into extension. He had immediate and severe pain. On exam he has significant weakness and pain to abduction as well as external rotation. X-rays reviewed, he has a high riding humeral head consistent with rotator cuff deficiency, as well as widespread osteoarthritis but no obvious fractures. Because of the acuteness of the injury we are going to place him in a sling with some hydrocodone, and give it 1 to 2 weeks, if insufficient improvement or return to baseline then we will proceed with MRI.    ___________________________________________ Gwen Her. Dianah Field, M.D., ABFM., CAQSM. Primary Care and [place] Instructor of [place] of [facility] of Medicine

## 2019-06-20 ENCOUNTER — Ambulatory Visit: Payer: Managed Care, Other (non HMO) | Admitting: Rehabilitative and Restorative Service Providers"

## 2019-06-30 ENCOUNTER — Encounter: Payer: Self-pay | Admitting: Sports Medicine

## 2019-06-30 ENCOUNTER — Other Ambulatory Visit: Payer: Self-pay

## 2019-06-30 ENCOUNTER — Ambulatory Visit (INDEPENDENT_AMBULATORY_CARE_PROVIDER_SITE_OTHER): Payer: Managed Care, Other (non HMO) | Admitting: Sports Medicine

## 2019-06-30 DIAGNOSIS — M25511 Pain in right shoulder: Secondary | ICD-10-CM | POA: Diagnosis not present

## 2019-06-30 NOTE — Progress Notes (Signed)
    Procedures performed today:    None.  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  Brief History, Exam, Impression, and Recommendations:    Right shoulder pain This pleasant 59 year old male returns, he has a long history of right shoulder pain, and history of a rotator cuff repair in the past. Approximately 2 to 3 weeks ago we did a subacromial injection, I did see at that time what appeared to be a full-thickness or near full-thickness tear of his supraspinatus. He was doing extremely well, sleeping through the night after the shot, and highly functional. Unfortunately he was walking his dog and tripped, he fell and his right shoulder was jerked into extension. He had immediate pain, severe, and significant weakness and pain to abduction and external rotation on exam at the last visit 2 weeks ago. On x-rays he did have a significantly high riding humeral head consistent with rotator cuff deficiency as well as widespread osteoarthritis but no obvious fractures. We placed him in a sling, we gave it a couple of weeks to calm down, he returns today with significant and persistent pain as well as weakness to abduction and external rotation consistent with rotator cuff tearing. Proceeding with MRI and referral to Dr. Griffin Basil, I am confident he needs a repeat rotator cuff repair if possible.     ___________________________________________ Gwen Her. Dianah Field, M.D., ABFM., CAQSM. Primary Care and [place] Instructor of [place] of [facility] of Medicine

## 2019-06-30 NOTE — Assessment & Plan Note (Signed)
This pleasant 59 year old male returns, he has a long history of right shoulder pain, and history of a rotator cuff repair in the past. Approximately 2 to 3 weeks ago we did a subacromial injection, I did see at that time what appeared to be a full-thickness or near full-thickness tear of his supraspinatus. He was doing extremely well, sleeping through the night after the shot, and highly functional. Unfortunately he was walking his dog and tripped, he fell and his right shoulder was jerked into extension. He had immediate pain, severe, and significant weakness and pain to abduction and external rotation on exam at the last visit 2 weeks ago. On x-rays he did have a significantly high riding humeral head consistent with rotator cuff deficiency as well as widespread osteoarthritis but no obvious fractures. We placed him in a sling, we gave it a couple of weeks to calm down, he returns today with significant and persistent pain as well as weakness to abduction and external rotation consistent with rotator cuff tearing. Proceeding with MRI and referral to Dr. Griffin Basil, I am confident he needs a repeat rotator cuff repair if possible.

## 2019-07-03 ENCOUNTER — Other Ambulatory Visit: Payer: Self-pay | Admitting: Family Medicine

## 2019-07-04 NOTE — Telephone Encounter (Signed)
CM-Plz see refill req/thx dmf 

## 2019-07-05 ENCOUNTER — Other Ambulatory Visit: Payer: Self-pay | Admitting: Orthopaedic Surgery

## 2019-07-05 DIAGNOSIS — M25511 Pain in right shoulder: Secondary | ICD-10-CM

## 2019-07-06 MED ORDER — HYDROCODONE-ACETAMINOPHEN 10-325 MG PO TABS: 1.0000 | ORAL_TABLET | Freq: Three times a day (TID) | ORAL | 0 refills | Status: DC | PRN

## 2019-07-07 ENCOUNTER — Telehealth: Payer: Self-pay | Admitting: *Deleted

## 2019-07-07 NOTE — Telephone Encounter (Signed)
   Primary Cardiologist: Lauree Chandler, MD  Chart reviewed as part of pre-operative protocol coverage. Patient was contacted 07/07/2019 in reference to pre-operative risk assessment for pending surgery as outlined below.  [name] was last seen by Richardson Dopp PAC in 08/2018. He was seen for palpitations that have since resolved. He underwent an ETT that was negative for signs of ischemia. He has never had a MI or stroke. Since his last visit, Zaine Zuhlke has done well. He can complete more than 4.0 METS without anginal symptoms. No medications need to be held for surgery.  Therefore, based on ACC/AHA guidelines, the patient would be at acceptable risk for the planned procedure without further cardiovascular testing.   I will route this recommendation to the requesting party via Epic fax function and remove from pre-op pool.  Please call with questions.  Tami Lin Yahir Tavano, PA 07/07/2019, 3:55 PM

## 2019-07-07 NOTE — Telephone Encounter (Signed)
    [facility] Pre-operative Risk Assessment    Request for surgical clearance:  1. What type of surgery is being performed? RIGHT REVERSE TOTAL SHOULDER REPLACEMENT   2. When is this surgery scheduled? TBD   3. What type of clearance is required (medical clearance vs. Pharmacy clearance to hold med vs. Both)? MEDICAL  4. Are there any medications that need to be held prior to surgery and how long? NONE LISTED   5. Practice name and name of physician performing surgery? [facility]; DR. VARKEY   6. What is your office phone number [phone] (KELLY)    7.   What is your office fax number [phone] ATTN: KELLY  8.   Anesthesia type (None, local, MAC, general) ? CHOICE   Bryce King 07/07/2019, 3:23 PM  _________________________________________________________________   (provider comments below)

## 2019-07-18 ENCOUNTER — Ambulatory Visit
Admission: RE | Admit: 2019-07-18 | Discharge: 2019-07-18 | Disposition: A | Discharge status: Home / Self Care | Payer: Managed Care, Other (non HMO) | Source: Ambulatory Visit | Attending: Orthopaedic Surgery | Admitting: Orthopaedic Surgery

## 2019-07-18 ENCOUNTER — Encounter: Payer: Self-pay | Admitting: Family Medicine

## 2019-07-18 DIAGNOSIS — M25511 Pain in right shoulder: Secondary | ICD-10-CM

## 2019-07-19 ENCOUNTER — Other Ambulatory Visit: Payer: Self-pay | Admitting: Family Medicine

## 2019-07-19 DIAGNOSIS — Z1211 Encounter for screening for malignant neoplasm of colon: Secondary | ICD-10-CM

## 2019-07-25 ENCOUNTER — Telehealth: Payer: Self-pay

## 2019-07-25 NOTE — Telephone Encounter (Signed)
Dr Rich Fuchs office with [facility] and [facility] called and asked if the surgical clearance form has been received. I advised them to fax the paperwork. She states it asked about A1C and BMI.

## 2019-07-25 NOTE — Telephone Encounter (Signed)
Clearance form received and completed.  Placed in Bryce King's box to fax over.

## 2019-08-03 ENCOUNTER — Ambulatory Visit: Payer: Managed Care, Other (non HMO) | Admitting: Sports Medicine

## 2019-08-04 NOTE — Patient Instructions (Addendum)
DUE TO COVID-19 ONLY ONE VISITOR ARE ALLOWED TO COME WITH YOU AND STAY IN THE WAITING ROOM ONLY DURING PRE OP AND PROCEDURE. THEN TWO VISITORS MAY VISIT WITH YOU IN YOUR PRIVATE ROOM DURING VISITING HOURS ONLY!!   COVID SWAB TESTING MUST BE COMPLETED ON: Saturday, Aug 06, 2019 at 11:20AM [address], [place] [facility] pre surgical testing line (Must self quarantine after testing. Follow instructions on handout.)            Your procedure is scheduled on: Wednesday, Aug 10, 2019   Report to [facility] Main  Entrance    Report to admitting at 9:00 AM   Call this number if you have problems the morning of surgery [phone]   Bring CPAP mask and tubing day of surgery   Do not eat food :After Midnight.   May have liquids until 8:30 AM day of surgery   CLEAR LIQUID DIET  Foods Allowed                                                                     Foods Excluded  Water, Black Coffee and tea, regular and decaf                             liquids that you cannot  Plain Jell-O in any flavor  (No red)                                           see through such as: Fruit ices (not with fruit pulp)                                     milk, soups, orange juice  Iced Popsicles (No red)                                    All solid food Carbonated beverages, regular and diet                                    Apple juices Sports drinks like Gatorade (No red) Lightly seasoned clear broth or consume(fat free) Sugar, honey syrup  Sample Menu Breakfast                                Lunch                                     Supper Cranberry juice                    Beef broth  Chicken broth Jell-O                                     Grape juice                           Apple juice Coffee or tea                        Jell-O                                      Popsicle                                                 Coffee or tea                        Coffee or tea   Complete one Ensure drink the morning of surgery at 8:30 AM the day of surgery.   Oral Hygiene is also important to reduce your risk of infection.                                    Remember - BRUSH YOUR TEETH THE MORNING OF SURGERY WITH YOUR REGULAR TOOTHPASTE   Do NOT smoke after Midnight   Take these medicines the morning of surgery with A SIP OF WATER: Bupropion, Ezetimibe                               You may not have any metal on your body including jewelry, and body piercings             Do not wear lotions, powders, perfumes/cologne, or deodorant                          Men may shave face and neck.   Do not bring valuables to the hospital. [place].   Contacts, dentures or bridgework may not be worn into surgery.   Bring small overnight bag day of surgery.    Patients discharged the day of surgery will not be allowed to drive home.   Special Instructions: Bring a copy of your healthcare power of attorney and living will documents         the day of surgery if you haven't scanned them in before.              Please read over the following fact sheets you were given: IF YOU HAVE QUESTIONS ABOUT YOUR PRE OP INSTRUCTIONS PLEASE CALL [place]- Preparing for Total Shoulder Arthroplasty    Before surgery, you can play an important role. Because skin is not sterile, your skin needs to be as free of germs as possible. You can reduce the number of germs on your skin by using the following products. . Benzoyl Peroxide Gel  o Reduces the number of germs present on the skin o Applied twice a day to shoulder area starting two days before surgery    ==================================================================  Please follow these instructions carefully:  BENZOYL PEROXIDE 5% GEL  Please do not use if you have an allergy to benzoyl peroxide.   If your skin  becomes reddened/irritated stop using the benzoyl peroxide.  Starting two days before surgery, apply as follows: (Monday and Tuesday) 1. Apply benzoyl peroxide in the morning and at night. Apply after taking a shower. If you are not taking a shower clean entire shoulder front, back, and side along with the armpit with a clean wet washcloth.  2. Place a quarter-sized dollop on your shoulder and rub in thoroughly, making sure to cover the front, back, and side of your shoulder, along with the armpit.   2 days before ____ AM   ____ PM              1 day before ____ AM   ____ PM                         3. Do this twice a day for two days.  (Last application is the night before surgery, AFTER using the CHG soap as described below).  4. Do NOT apply benzoyl peroxide gel on the day of surgery. 5.  [place] - Preparing for Surgery Before surgery, you can play an important role.  Because skin is not sterile, your skin needs to be as free of germs as possible.  You can reduce the number of germs on your skin by washing with CHG (chlorahexidine gluconate) soap before surgery.  CHG is an antiseptic cleaner which kills germs and bonds with the skin to continue killing germs even after washing. Please DO NOT use if you have an allergy to CHG or antibacterial soaps.  If your skin becomes reddened/irritated stop using the CHG and inform your nurse when you arrive at Short Stay. Do not shave (including legs and underarms) for at least 48 hours prior to the first CHG shower.  You may shave your face/neck.  Please follow these instructions carefully:  1.  Shower with CHG Soap the night before surgery and the  morning of surgery.  2.  If you choose to wash your hair, wash your hair first as usual with your normal  shampoo.  3.  After you shampoo, rinse your hair and body thoroughly to remove the shampoo.                             4.  Use CHG as you would any other liquid soap.  You can apply chg directly to  the skin and wash.  Gently with a scrungie or clean washcloth.  5.  Apply the CHG Soap to your body ONLY FROM THE NECK DOWN.   Do   not use on face/ open                           Wound or open sores. Avoid contact with eyes, ears mouth and   genitals (private parts).                       Wash face,  Genitals (private parts) with your normal soap.  6.  Wash thoroughly, paying special attention to the area where your    surgery  will be performed.  7.  Thoroughly rinse your body with warm water from the neck down.  8.  DO NOT shower/wash with your normal soap after using and rinsing off the CHG Soap.                9.  Pat yourself dry with a clean towel.            10.  Wear clean pajamas.            11.  Place clean sheets on your bed the night of your first shower and do not  sleep with pets. Day of Surgery : Do not apply any lotions/deodorants the morning of surgery.  Please wear clean clothes to the hospital/surgery center.  FAILURE TO FOLLOW THESE INSTRUCTIONS MAY RESULT IN THE CANCELLATION OF YOUR SURGERY  PATIENT SIGNATURE_________________________________  NURSE SIGNATURE__________________________________  ________________________________________________________________________   Adam Phenix  An incentive spirometer is a tool that can help keep your lungs clear and active. This tool measures how well you are filling your lungs with each breath. Taking long deep breaths may help reverse or decrease the chance of developing breathing (pulmonary) problems (especially infection) following:  A long period of time when you are unable to move or be active. BEFORE THE PROCEDURE   If the spirometer includes an indicator to show your best effort, your nurse or respiratory therapist will set it to a desired goal.  If possible, sit up straight or lean slightly forward. Try not to slouch.  Hold the incentive spirometer in an upright position. INSTRUCTIONS FOR USE   1. Sit on the edge of your bed if possible, or sit up as far as you can in bed or on a chair. 2. Hold the incentive spirometer in an upright position. 3. Breathe out normally. 4. Place the mouthpiece in your mouth and seal your lips tightly around it. 5. Breathe in slowly and as deeply as possible, raising the piston or the ball toward the top of the column. 6. Hold your breath for 3-5 seconds or for as long as possible. Allow the piston or ball to fall to the bottom of the column. 7. Remove the mouthpiece from your mouth and breathe out normally. 8. Rest for a few seconds and repeat Steps 1 through 7 at least 10 times every 1-2 hours when you are awake. Take your time and take a few normal breaths between deep breaths. 9. The spirometer may include an indicator to show your best effort. Use the indicator as a goal to work toward during each repetition. 10. After each set of 10 deep breaths, practice coughing to be sure your lungs are clear. If you have an incision (the cut made at the time of surgery), support your incision when coughing by placing a pillow or rolled up towels firmly against it. Once you are able to get out of bed, walk around indoors and cough well. You may stop using the incentive spirometer when instructed by your caregiver.  RISKS AND COMPLICATIONS  Take your time so you do not get dizzy or light-headed.  If you are in pain, you may need to take or ask for pain medication before doing incentive spirometry. It is harder to take a deep breath if you are having pain. AFTER USE  Rest and breathe slowly and easily.  It can be helpful to keep track of a  log of your progress. Your caregiver can provide you with a simple table to help with this. If you are using the spirometer at home, follow these instructions: [place] IF:   You are having difficultly using the spirometer.  You have trouble using the spirometer as often as instructed.  Your pain medication is  not giving enough relief while using the spirometer.  You develop fever of 100.5 F (38.1 C) or higher. SEEK IMMEDIATE MEDICAL CARE IF:   You cough up bloody sputum that had not been present before.  You develop fever of 102 F (38.9 C) or greater.  You develop worsening pain at or near the incision site. MAKE SURE YOU:   Understand these instructions.  Will watch your condition.  Will get help right away if you are not doing well or get worse. Document Released: 07/28/2006 Document Revised: 06/09/2011 Document Reviewed: 09/28/2006 [facility] Patient Information 2014 [place], [place].   ________________________________________________________________________

## 2019-08-05 ENCOUNTER — Encounter (HOSPITAL_COMMUNITY): Payer: Self-pay

## 2019-08-05 ENCOUNTER — Encounter (HOSPITAL_COMMUNITY)
Admission: RE | Admit: 2019-08-05 | Discharge: 2019-08-05 | Disposition: A | Discharge status: Home / Self Care | Payer: Managed Care, Other (non HMO) | Source: Ambulatory Visit | Attending: Orthopaedic Surgery | Admitting: Orthopaedic Surgery

## 2019-08-05 ENCOUNTER — Other Ambulatory Visit: Payer: Self-pay

## 2019-08-05 DIAGNOSIS — Z7901 Long term (current) use of anticoagulants: Secondary | ICD-10-CM | POA: Diagnosis not present

## 2019-08-05 DIAGNOSIS — I10 Essential (primary) hypertension: Secondary | ICD-10-CM | POA: Diagnosis not present

## 2019-08-05 DIAGNOSIS — Z79899 Other long term (current) drug therapy: Secondary | ICD-10-CM | POA: Insufficient documentation

## 2019-08-05 DIAGNOSIS — E785 Hyperlipidemia, unspecified: Secondary | ICD-10-CM | POA: Insufficient documentation

## 2019-08-05 DIAGNOSIS — G4733 Obstructive sleep apnea (adult) (pediatric): Secondary | ICD-10-CM | POA: Insufficient documentation

## 2019-08-05 DIAGNOSIS — Z01818 Encounter for other preprocedural examination: Secondary | ICD-10-CM | POA: Insufficient documentation

## 2019-08-05 DIAGNOSIS — Z7982 Long term (current) use of aspirin: Secondary | ICD-10-CM | POA: Insufficient documentation

## 2019-08-05 DIAGNOSIS — M19011 Primary osteoarthritis, right shoulder: Secondary | ICD-10-CM | POA: Insufficient documentation

## 2019-08-05 HISTORY — DX: Unspecified osteoarthritis, unspecified site: M19.90

## 2019-08-05 HISTORY — DX: Palpitations: R00.2

## 2019-08-05 LAB — BASIC METABOLIC PANEL
Anion gap: 10 (ref 5–15)
BUN: 11 mg/dL (ref 6–20)
CO2: 26 mmol/L (ref 22–32)
Calcium: 9.3 mg/dL (ref 8.9–10.3)
Chloride: 103 mmol/L (ref 98–111)
Creatinine, Ser: 0.94 mg/dL (ref 0.61–1.24)
GFR calc Af Amer: >60 mL/min (ref >60)
GFR calc non Af Amer: >60 mL/min (ref >60)
Glucose, Bld: 101 mg/dL — ABNORMAL HIGH (ref 70–99)
Potassium: 3.8 mmol/L (ref 3.5–5.1)
Sodium: 139 mmol/L (ref 135–145)

## 2019-08-05 LAB — CBC
HCT: 46.3 % (ref 39.0–52.0)
Hemoglobin: 15.5 g/dL (ref 13.0–17.0)
MCH: 32.8 pg (ref 26.0–34.0)
MCHC: 33.5 g/dL (ref 30.0–36.0)
MCV: 97.9 fL (ref 80.0–100.0)
Platelets: 198 10*3/uL (ref 150–400)
RBC: 4.73 MIL/uL (ref 4.22–5.81)
RDW: 12.7 % (ref 11.5–15.5)
WBC: 5 10*3/uL (ref 4.0–10.5)
nRBC: 0 % (ref 0.0–0.2)

## 2019-08-05 NOTE — Progress Notes (Signed)
PCP - DR. CODY MATTHEWS Cardiologist - Dr. Kathleen Argue C. Fiato 07/07/19 clearance telephone encounter in epic  Chest x-ray - N/A EKG - 08/05/19 in epic Stress Test - 05/25/18 in epic ECHO - 05/19/19 in epic Cardiac Cath - N/A  Sleep Study - Yes CPAP - Yes  Fasting Blood Sugar - N/A Checks Blood Sugar __N/A___ times a day Hgb A1C 5.6 06/06/19 in epic  Blood Thinner Instructions:N/A Aspirin Instructions: Instructed to contact Dr. Rich Fuchs office about aspirin instructions Last Dose: currently taking  Anesthesia review: N/A  Patient denies shortness of breath, fever, cough and chest pain at PAT appointment   Patient verbalized understanding of instructions that were given to them at the PAT appointment. Patient was also instructed that they will need to review over the PAT instructions again at home before surgery.

## 2019-08-06 ENCOUNTER — Other Ambulatory Visit (HOSPITAL_COMMUNITY)
Admission: RE | Admit: 2019-08-06 | Discharge: 2019-08-06 | Disposition: A | Discharge status: Home / Self Care | Payer: Managed Care, Other (non HMO) | Source: Ambulatory Visit | Attending: Orthopaedic Surgery | Admitting: Orthopaedic Surgery

## 2019-08-06 DIAGNOSIS — Z20822 Contact with and (suspected) exposure to covid-19: Secondary | ICD-10-CM | POA: Diagnosis not present

## 2019-08-06 DIAGNOSIS — Z01812 Encounter for preprocedural laboratory examination: Secondary | ICD-10-CM | POA: Insufficient documentation

## 2019-08-06 LAB — SARS CORONAVIRUS 2 (TAT 6-24 HRS): SARS Coronavirus 2: NEGATIVE

## 2019-08-08 NOTE — Progress Notes (Signed)
Bryce King 07/25/19 clearance received  And Dr. Estevan Ryder 07/07/19 clearance received and placed in chart.

## 2019-08-08 NOTE — Progress Notes (Signed)
Anesthesia Chart Review   Case: [id number] Date/Time: 08/10/19 1115   Procedure: REVERSE SHOULDER ARTHROPLASTY (Right Shoulder)   Anesthesia type: Choice   Pre-op diagnosis: DJD RIGHT SHOULDER   Location[facility] ROOM 06 / [facility]   Surgeons: Hiram Gash, MD      DISCUSSION:59 y.o. current every day smoker with h/o HTN, OSA, HLD, right shoulder DJD scheduled for above procedure 08/10/19 with Dr. Ophelia Charter.   Per cardiology 07/07/2019 telephone encounter, "Chart reviewed as part of pre-operative protocol coverage. Patient was contacted 07/07/2019 in reference to pre-operative risk assessment for pending surgery as outlined below.  [name] was last seen by Richardson Dopp PAC in 08/2018. He was seen for palpitations that have since resolved. He underwent an ETT that was negative for signs of ischemia. He has never had a MI or stroke. Since his last visit, [place] has done well. He can complete more than 4.0 METS without anginal symptoms. No medications need to be held for surgery. Therefore, based on ACC/AHA guidelines, the patient would be at acceptable risk for the planned procedure without further cardiovascular testing."  Anticipate pt can proceed with planned procedure barring acute status change.   VS: BP (!) 149/87   Pulse 84   Temp 37 C (Oral)   Resp 16   Ht 6' (1.829 m)   Wt 102.5 kg   SpO2 100%   BMI 30.65 kg/m   PROVIDERS: Luetta Nutting, DO is PCP   Lauree Chandler, MD is Cardiologist  LABS: Labs reviewed: Acceptable for surgery. (all labs ordered are listed, but only abnormal results are displayed)  Labs Reviewed  BASIC METABOLIC PANEL - Abnormal; Notable for the following components:      Result Value   Glucose, Bld 101 (*)    All other components within normal limits  CBC     IMAGES:   EKG: 08/05/2019 Rate 85 bpm  Normal sinus rhythm   CV: Exercise Tolerance Test 05/25/2018  Blood pressure demonstrated a normal response to  exercise.  There was no ST segment deviation noted during stress.   ETT with good exercise tolerance (9:00); no chest pain; normal blood pressure response; no ST changes; negative adequate exercise tolerance test; Duke treadmill score 9.  Echo 05/18/2018 IMPRESSIONS    1. The left ventricle has normal systolic function, with an ejection  fraction of 55-60%. The cavity size was normal. Left ventricular diastolic  parameters were normal No evidence of left ventricular regional wall  motion abnormalities.  2. The right ventricle has normal systolic function. The cavity was  normal. There is no increase in right ventricular wall thickness. Right  ventricular systolic pressure could not be assessed.  3. The mitral valve is normal in structure.  4. The tricuspid valve is normal in structure.  5. The aortic valve is tricuspid.  6. No evidence of left ventricular regional wall motion abnormalities.  7. Right atrial pressure is estimated at 3 mmHg. Past Medical History:  Diagnosis Date  . Arthritis   . Dizziness   . Heart palpitations   . Hyperlipidemia   . Hypertension   . OSA (obstructive sleep apnea)    Uses CPAP    Past Surgical History:  Procedure Laterality Date  . ELBOW SURGERY Right 2005  . ROTATOR CUFF REPAIR Right 2007    MEDICATIONS: . acetaminophen (TYLENOL) 500 MG tablet  . aspirin EC 81 MG tablet  . buPROPion (WELLBUTRIN SR) 150 MG 12 hr tablet  . celecoxib (  CELEBREX) 200 MG capsule  . ezetimibe (ZETIA) 10 MG tablet  . HYDROcodone-acetaminophen (NORCO) 10-325 MG tablet  . lisinopril-hydrochlorothiazide (ZESTORETIC) 20-12.5 MG tablet  . mometasone (ELOCON) 0.1 % cream  . Multiple Vitamin (MULTIVITAMIN WITH MINERALS) TABS tablet  . nitroGLYCERIN (NITROSTAT) 0.4 MG SL tablet  . propranolol (INDERAL) 10 MG tablet  . zolpidem (AMBIEN) 5 MG tablet   No current facility-administered medications for this encounter.    Konrad Felix, PA-C [facility]Surgical  Testing [phone] 08/08/19  1:25 PM

## 2019-08-08 NOTE — Anesthesia Preprocedure Evaluation (Addendum)
Anesthesia Evaluation  Patient identified by MRN, date of birth, ID band Patient awake    Reviewed: Allergy & Precautions, NPO status , Patient's Chart, lab work & pertinent test results  Airway Mallampati: II  TM Distance: >3 FB Neck ROM: Full    Dental  (+) Dental Advisory Given   Pulmonary sleep apnea and Continuous Positive Airway Pressure Ventilation , Current Smoker,    breath sounds clear to auscultation       Cardiovascular hypertension, Pt. on medications and Pt. on home beta blockers  Rhythm:Regular Rate:Normal     Neuro/Psych negative neurological ROS     GI/Hepatic negative GI ROS, Neg liver ROS,   Endo/Other  negative endocrine ROS  Renal/GU negative Renal ROS     Musculoskeletal  (+) Arthritis ,   Abdominal   Peds  Hematology negative hematology ROS (+)   Anesthesia Other Findings   Reproductive/Obstetrics                            Lab Results  Component Value Date   WBC 5.0 08/05/2019   HGB 15.5 08/05/2019   HCT 46.3 08/05/2019   MCV 97.9 08/05/2019   PLT 198 08/05/2019   Lab Results  Component Value Date   CREATININE 0.94 08/05/2019   BUN 11 08/05/2019   NA 139 08/05/2019   K 3.8 08/05/2019   CL 103 08/05/2019   CO2 26 08/05/2019   EKG: 08/05/2019 Rate 85 bpm  Normal sinus rhythm   CV: Exercise Tolerance Test 05/25/2018  Blood pressure demonstrated a normal response to exercise.  There was no ST segment deviation noted during stress.  ETT with good exercise tolerance (9:00); no chest pain; normal blood pressure response; no ST changes; negative adequate exercise tolerance test; Duke treadmill score 9.  Echo 05/18/2018 IMPRESSIONS    1. The left ventricle has normal systolic function, with an ejection  fraction of 55-60%. The cavity size was normal. Left ventricular diastolic  parameters were normal No evidence of left ventricular regional wall   motion abnormalities.  2. The right ventricle has normal systolic function. The cavity was  normal. There is no increase in right ventricular wall thickness. Right  ventricular systolic pressure could not be assessed.  3. The mitral valve is normal in structure.  4. The tricuspid valve is normal in structure.  5. The aortic valve is tricuspid.  6. No evidence of left ventricular regional wall motion abnormalities.  7. Right atrial pressure is estimated at 3 mmHg.  Anesthesia Physical Anesthesia Plan  ASA: II  Anesthesia Plan: General   Post-op Pain Management:  Regional for Post-op pain   Induction: Intravenous  PONV Risk Score and Plan: 1 and Dexamethasone, Ondansetron and Treatment may vary due to age or medical condition  Airway Management Planned: Oral ETT  Additional Equipment: None  Intra-op Plan:   Post-operative Plan: Extubation in OR  Informed Consent: I have reviewed the patients History and Physical, chart, labs and discussed the procedure including the risks, benefits and alternatives for the proposed anesthesia with the patient or authorized representative who has indicated his/her understanding and acceptance.     Dental advisory given  Plan Discussed with: CRNA  Anesthesia Plan Comments: (Per cardiology 07/07/2019 telephone encounter, "Chart reviewed as part of pre-operative protocol coverage. Patient was contacted 07/07/2019 in reference to pre-operative risk assessment for pending surgery as outlined below.  [name] was last seen by Richardson Dopp PAC in  08/2018. He was seen for palpitations that have since resolved. He underwent an ETT that was negative for signs of ischemia. He has never had a MI or stroke. Since his last visit, Krithik Padmanabhan has done well. He can complete more than 4.0 METS without anginal symptoms. No medications need to be held for surgery. Therefore, based on ACC/AHA guidelines, the patient would be at acceptable risk  for the planned procedure without further cardiovascular testing.")       Anesthesia Quick Evaluation

## 2019-08-09 NOTE — H&P (Signed)
PREOPERATIVE H&P  Chief Complaint: DJD RIGHT SHOULDER  HPI: Bryce King is a 59 y.o. male who is scheduled for REVERSE SHOULDER ARTHROPLASTY.   Patient has a past medical history significant for HTN, OSA on CPAP, and HLD.   The patient is a 59 year old who tripped and fell over a dog a few months ago.  He has a history of mini open rotator cuff repair in 2007 in [place].  He has had pain for the last few months.    His symptoms are rated as moderate to severe, and have been worsening.  This is significantly impairing activities of daily living.    Please see clinic note for further details on this patient's care.    He has elected for surgical management.   Past Medical History:  Diagnosis Date  . Arthritis   . Dizziness   . Heart palpitations   . Hyperlipidemia   . Hypertension   . OSA (obstructive sleep apnea)    Uses CPAP   Past Surgical History:  Procedure Laterality Date  . ELBOW SURGERY Right 2005  . ROTATOR CUFF REPAIR Right 2007   Social History   Socioeconomic History  . Marital status: Married    Spouse name: Not on file  . Number of children: 1  . Years of education: Not on file  . Highest education level: Some college, no degree  Occupational History  . Not on file  Tobacco Use  . Smoking status: Current Every Day Smoker    Packs/day: 1.00    Types: Cigarettes  . Smokeless tobacco: Never Used  Substance and Sexual Activity  . Alcohol use: Yes    Comment: weekly, "07/21/18 2 drinks a week"   . Drug use: Never  . Sexual activity: Not on file  Other Topics Concern  . Not on file  Social History Narrative   Lives with wife   Caffeine- coffee 2 cups, tea all day   Social Determinants of Health   Financial Resource Strain:   . Difficulty of Paying Living Expenses:   Food Insecurity:   . Worried About Charity fundraiser in the Last Year:   . Arboriculturist in the Last Year:   Transportation Needs:   . Film/video editor  (Medical):   [name] Lack of Transportation (Non-Medical):   Physical Activity:   . Days of Exercise per Week:   . Minutes of Exercise per Session:   Stress:   . Feeling of Stress :   Social Connections:   . Frequency of Communication with Friends and Family:   . Frequency of Social Gatherings with Friends and Family:   . Attends Religious Services:   . Active Member of Clubs or Organizations:   . Attends Archivist Meetings:   [name] Marital Status:    Family History  Problem Relation Age of Onset  . Cancer Father   . Depression Father   . Diabetes Father   . COPD Maternal Grandmother   . Heart attack Maternal Grandfather   . Heart attack Paternal Grandfather    Allergies  Allergen Reactions  . Statins Other (See Comments)    Muscle aches   Prior to Admission medications   Medication Sig Start Date End Date Taking? Authorizing Provider  acetaminophen (TYLENOL) 500 MG tablet Take 1,000 mg by mouth every 6 (six) hours as needed for moderate pain.   Yes [provider]  aspirin EC 81 MG tablet Take 81 mg by mouth  daily.   Yes [provider]  buPROPion (WELLBUTRIN SR) 150 MG 12 hr tablet TAKE 1 TABLET EVERY DAY FOR 3 DAYS THEN INCREASE TO 1 TABLET TWICE A DAY Patient taking differently: Take 150 mg by mouth 2 (two) times daily.  04/28/19  Yes Luetta Nutting, DO  celecoxib (CELEBREX) 200 MG capsule Take 200 mg by mouth 2 (two) times daily as needed for moderate pain.   Yes [provider]  ezetimibe (ZETIA) 10 MG tablet TAKE ONE TABLET BY MOUTH ONE TIME DAILY Patient taking differently: Take 10 mg by mouth daily.  07/04/19  Yes Luetta Nutting, DO  lisinopril-hydrochlorothiazide (ZESTORETIC) 20-12.5 MG tablet Take 2 tablets by mouth daily. 06/06/19  Yes Zigmund Daniel, Cody, DO  mometasone (ELOCON) 0.1 % cream Apply 1 application topically daily. Patient taking differently: Apply 1 application topically daily as needed (irritation).  05/05/18  Yes Luetta Nutting, DO   Multiple Vitamin (MULTIVITAMIN WITH MINERALS) TABS tablet Take 1 tablet by mouth daily.   Yes [provider]  nitroGLYCERIN (NITROSTAT) 0.4 MG SL tablet Place 0.4 mg under the tongue every 5 (five) minutes as needed for chest pain.    Yes [provider]  propranolol (INDERAL) 10 MG tablet Take 1 tablet (10 mg total) by mouth daily as needed (for palpitations). 09/10/18 09/10/19 Yes Weaver, Scott T, PA-C  zolpidem (AMBIEN) 5 MG tablet Take 5 mg by mouth at bedtime. 07/08/19  Yes [provider]  HYDROcodone-acetaminophen (NORCO) 10-325 MG tablet Take 1 tablet by mouth every 8 (eight) hours as needed. Patient not taking: Reported on 07/27/2019 07/06/19   Silverio Decamp, MD  ezetimibe (ZETIA) 10 MG tablet TAKE ONE TABLET BY MOUTH ONE TIME DAILY 01/04/19   Luetta Nutting, DO    ROS: All other systems have been reviewed and were otherwise negative with the exception of those mentioned in the HPI and as above.  Physical Exam: General: Alert, no acute distress Cardiovascular: No pedal edema Respiratory: No cyanosis, no use of accessory musculature GI: No organomegaly, abdomen is soft and non-tender Skin: No lesions in the area of chief complaint Neurologic: Sensation intact distally Psychiatric: Patient is competent for consent with normal mood and affect Lymphatic: No axillary or cervical lymphadenopathy  MUSCULOSKELETAL:  Right shoulder: active forward elevation to 160, external rotation to 30, internal rotation to T10.  Cuff strength is 4/5 supraspinatus.    Imaging: X-rays demonstrate a cuff tear arthropathy findings with [place] 4B classification and proximal humeral migration.    Assessment: Early cuff tear arthropathy.    Plan: Plan for Procedure(s): REVERSE SHOULDER ARTHROPLASTY  The risks benefits and alternatives were discussed with the patient including but not limited to the risks of nonoperative treatment, versus surgical intervention including  infection, bleeding, nerve injury,  blood clots, cardiopulmonary complications, morbidity, mortality, among others, and they were willing to proceed.   We additionally specifically discussed risks of axillary nerve injury, infection, periprosthetic fracture, continued pain and longevity of implants prior to beginning procedure.    Patientwill be monitored closely in PACU. If he is found stable in PACU, patient may be discharged home with outpatient follow-up. If any concern about medical stability, patient will beadmitted for observation for medical monitoring post-op and for pain control. The patient is planning to be discharged home with outpatient PT.  The patient acknowledged the explanation, agreed to proceed with the plan and consent was signed.   Patient has received operative clearance from his PCP, Dr. Luetta Nutting. Fabian Sharp PA, cardiology  team, has also cleared him for surgery as well.   Operative Plan: Right reverse total shoulder arthroplasty  Discharge Medications: Tylenol, Celebrex 200 mg, Oxycodone, Zofran DVT Prophylaxis: None Physical Therapy: Outpatient PT Special Discharge needs: [place], PA-C  08/09/2019 4:21 PM

## 2019-08-10 ENCOUNTER — Ambulatory Visit (HOSPITAL_COMMUNITY): Payer: Managed Care, Other (non HMO)

## 2019-08-10 ENCOUNTER — Ambulatory Visit (HOSPITAL_COMMUNITY): Payer: Managed Care, Other (non HMO) | Admitting: Physician Assistant

## 2019-08-10 ENCOUNTER — Encounter (HOSPITAL_COMMUNITY)
Admission: RE | Disposition: A | Discharge status: Home / Self Care | Payer: Self-pay | Source: Home / Self Care | Attending: Orthopaedic Surgery

## 2019-08-10 ENCOUNTER — Encounter (HOSPITAL_COMMUNITY): Payer: Self-pay | Admitting: Orthopaedic Surgery

## 2019-08-10 ENCOUNTER — Ambulatory Visit (HOSPITAL_COMMUNITY): Payer: Managed Care, Other (non HMO) | Admitting: Certified Registered Nurse Anesthetist

## 2019-08-10 ENCOUNTER — Ambulatory Visit (HOSPITAL_COMMUNITY)
Admission: RE | Admit: 2019-08-10 | Discharge: 2019-08-11 | Disposition: A | Discharge status: Home / Self Care | Payer: Managed Care, Other (non HMO) | Attending: Orthopaedic Surgery | Admitting: Orthopaedic Surgery

## 2019-08-10 ENCOUNTER — Other Ambulatory Visit: Payer: Self-pay

## 2019-08-10 DIAGNOSIS — Z8249 Family history of ischemic heart disease and other diseases of the circulatory system: Secondary | ICD-10-CM | POA: Insufficient documentation

## 2019-08-10 DIAGNOSIS — Z888 Allergy status to other drugs, medicaments and biological substances status: Secondary | ICD-10-CM | POA: Diagnosis not present

## 2019-08-10 DIAGNOSIS — Z79899 Other long term (current) drug therapy: Secondary | ICD-10-CM | POA: Diagnosis not present

## 2019-08-10 DIAGNOSIS — Z7951 Long term (current) use of inhaled steroids: Secondary | ICD-10-CM | POA: Diagnosis not present

## 2019-08-10 DIAGNOSIS — Z833 Family history of diabetes mellitus: Secondary | ICD-10-CM | POA: Insufficient documentation

## 2019-08-10 DIAGNOSIS — F17210 Nicotine dependence, cigarettes, uncomplicated: Secondary | ICD-10-CM | POA: Diagnosis not present

## 2019-08-10 DIAGNOSIS — E785 Hyperlipidemia, unspecified: Secondary | ICD-10-CM | POA: Insufficient documentation

## 2019-08-10 DIAGNOSIS — Z825 Family history of asthma and other chronic lower respiratory diseases: Secondary | ICD-10-CM | POA: Insufficient documentation

## 2019-08-10 DIAGNOSIS — Z09 Encounter for follow-up examination after completed treatment for conditions other than malignant neoplasm: Secondary | ICD-10-CM

## 2019-08-10 DIAGNOSIS — I10 Essential (primary) hypertension: Secondary | ICD-10-CM | POA: Diagnosis not present

## 2019-08-10 DIAGNOSIS — Z7982 Long term (current) use of aspirin: Secondary | ICD-10-CM | POA: Insufficient documentation

## 2019-08-10 DIAGNOSIS — G4733 Obstructive sleep apnea (adult) (pediatric): Secondary | ICD-10-CM | POA: Diagnosis not present

## 2019-08-10 DIAGNOSIS — M19011 Primary osteoarthritis, right shoulder: Secondary | ICD-10-CM | POA: Diagnosis present

## 2019-08-10 DIAGNOSIS — Z791 Long term (current) use of non-steroidal anti-inflammatories (NSAID): Secondary | ICD-10-CM | POA: Insufficient documentation

## 2019-08-10 DIAGNOSIS — Z809 Family history of malignant neoplasm, unspecified: Secondary | ICD-10-CM | POA: Insufficient documentation

## 2019-08-10 HISTORY — PX: REVERSE SHOULDER ARTHROPLASTY: SHX5054

## 2019-08-10 LAB — SURGICAL PCR SCREEN
MRSA, PCR: NEGATIVE
Staphylococcus aureus: NEGATIVE

## 2019-08-10 SURGERY — ARTHROPLASTY, SHOULDER, TOTAL, REVERSE
Anesthesia: General | Site: Shoulder | Laterality: Right

## 2019-08-10 MED ORDER — CEFAZOLIN SODIUM-DEXTROSE 2-4 GM/100ML-% IV SOLN
2.0000 g | INTRAVENOUS | Status: AC
  Administered 2019-08-10: 12:00:00 2 g via INTRAVENOUS
  Filled 2019-08-10: qty 100

## 2019-08-10 MED ORDER — LISINOPRIL 20 MG PO TABS
40.0000 mg | ORAL_TABLET | Freq: Every day | ORAL | Status: DC
  Administered 2019-08-10 – 2019-08-11 (×2): 40 mg via ORAL
  Filled 2019-08-10 (×2): qty 2

## 2019-08-10 MED ORDER — DOCUSATE SODIUM 100 MG PO CAPS
100.0000 mg | ORAL_CAPSULE | Freq: Two times a day (BID) | ORAL | Status: DC
  Administered 2019-08-10 – 2019-08-11 (×2): 100 mg via ORAL
  Filled 2019-08-10 (×2): qty 1

## 2019-08-10 MED ORDER — LACTATED RINGERS IV SOLN
INTRAVENOUS | Status: DC
  Administered 2019-08-10: 1000 mL via INTRAVENOUS

## 2019-08-10 MED ORDER — PROPRANOLOL HCL 10 MG PO TABS
10.0000 mg | ORAL_TABLET | Freq: Every day | ORAL | Status: DC | PRN
  Filled 2019-08-10: qty 1

## 2019-08-10 MED ORDER — ACETAMINOPHEN 500 MG PO TABS
1000.0000 mg | ORAL_TABLET | Freq: Once | ORAL | Status: AC
  Administered 2019-08-10: 1000 mg via ORAL
  Filled 2019-08-10: qty 2

## 2019-08-10 MED ORDER — ONDANSETRON HCL 4 MG/2ML IJ SOLN
INTRAMUSCULAR | Status: AC
  Filled 2019-08-10: qty 2

## 2019-08-10 MED ORDER — HYDROMORPHONE HCL 1 MG/ML IJ SOLN: 0.5000 mg | INTRAMUSCULAR | Status: DC | PRN

## 2019-08-10 MED ORDER — EZETIMIBE 10 MG PO TABS
10.0000 mg | ORAL_TABLET | Freq: Every day | ORAL | Status: DC
  Administered 2019-08-10: 10 mg via ORAL
  Filled 2019-08-10: qty 1

## 2019-08-10 MED ORDER — VANCOMYCIN HCL 1000 MG IV SOLR
INTRAVENOUS | Status: AC
  Filled 2019-08-10: qty 1000

## 2019-08-10 MED ORDER — ONDANSETRON HCL 4 MG/2ML IJ SOLN: 4.0000 mg | Freq: Four times a day (QID) | INTRAMUSCULAR | Status: DC | PRN

## 2019-08-10 MED ORDER — FENTANYL CITRATE (PF) 100 MCG/2ML IJ SOLN
INTRAMUSCULAR | Status: DC | PRN
  Administered 2019-08-10: 100 ug via INTRAVENOUS

## 2019-08-10 MED ORDER — MAGNESIUM CITRATE PO SOLN: 1.0000 | Freq: Once | ORAL | Status: DC | PRN

## 2019-08-10 MED ORDER — TRANEXAMIC ACID-NACL 1000-0.7 MG/100ML-% IV SOLN
1000.0000 mg | INTRAVENOUS | Status: AC
  Administered 2019-08-10: 12:00:00 1000 mg via INTRAVENOUS
  Filled 2019-08-10: qty 100

## 2019-08-10 MED ORDER — CELECOXIB 200 MG PO CAPS
200.0000 mg | ORAL_CAPSULE | Freq: Two times a day (BID) | ORAL | Status: DC
  Administered 2019-08-10 – 2019-08-11 (×2): 200 mg via ORAL
  Filled 2019-08-10 (×2): qty 1

## 2019-08-10 MED ORDER — PHENYLEPHRINE HCL (PRESSORS) 10 MG/ML IV SOLN
INTRAVENOUS | Status: AC
  Filled 2019-08-10: qty 1

## 2019-08-10 MED ORDER — FENTANYL CITRATE (PF) 100 MCG/2ML IJ SOLN: 25.0000 ug | INTRAMUSCULAR | Status: DC | PRN

## 2019-08-10 MED ORDER — HYDROCHLOROTHIAZIDE 25 MG PO TABS
25.0000 mg | ORAL_TABLET | Freq: Every day | ORAL | Status: DC
  Administered 2019-08-10 – 2019-08-11 (×2): 25 mg via ORAL
  Filled 2019-08-10 (×2): qty 1

## 2019-08-10 MED ORDER — MENTHOL 3 MG MT LOZG
1.0000 | LOZENGE | OROMUCOSAL | Status: DC | PRN
  Administered 2019-08-10: 3 mg via ORAL
  Filled 2019-08-10: qty 9

## 2019-08-10 MED ORDER — STERILE WATER FOR IRRIGATION IR SOLN
Status: DC | PRN
  Administered 2019-08-10: 2000 mL

## 2019-08-10 MED ORDER — DEXAMETHASONE SODIUM PHOSPHATE 10 MG/ML IJ SOLN
INTRAMUSCULAR | Status: DC | PRN
  Administered 2019-08-10: 8 mg via INTRAVENOUS

## 2019-08-10 MED ORDER — 0.9 % SODIUM CHLORIDE (POUR BTL) OPTIME
TOPICAL | Status: DC | PRN
  Administered 2019-08-10: 1000 mL

## 2019-08-10 MED ORDER — FENTANYL CITRATE (PF) 100 MCG/2ML IJ SOLN
INTRAMUSCULAR | Status: AC
  Filled 2019-08-10: qty 2

## 2019-08-10 MED ORDER — VANCOMYCIN HCL 1 G IV SOLR
INTRAVENOUS | Status: DC | PRN
  Administered 2019-08-10: 1000 mg via TOPICAL

## 2019-08-10 MED ORDER — BISACODYL 10 MG RE SUPP: 10.0000 mg | Freq: Every day | RECTAL | Status: DC | PRN

## 2019-08-10 MED ORDER — ZOLPIDEM TARTRATE 5 MG PO TABS
5.0000 mg | ORAL_TABLET | Freq: Every evening | ORAL | Status: DC | PRN
  Administered 2019-08-10: 5 mg via ORAL
  Filled 2019-08-10: qty 1

## 2019-08-10 MED ORDER — ASPIRIN EC 81 MG PO TBEC
81.0000 mg | DELAYED_RELEASE_TABLET | Freq: Every day | ORAL | Status: DC
  Administered 2019-08-11: 81 mg via ORAL
  Filled 2019-08-10: qty 1

## 2019-08-10 MED ORDER — MIDAZOLAM HCL 2 MG/2ML IJ SOLN
2.0000 mg | Freq: Once | INTRAMUSCULAR | Status: AC
  Administered 2019-08-10: 2 mg via INTRAVENOUS
  Filled 2019-08-10: qty 2

## 2019-08-10 MED ORDER — CHLORHEXIDINE GLUCONATE 0.12 % MT SOLN
15.0000 mL | Freq: Once | OROMUCOSAL | Status: AC
  Administered 2019-08-10: 15 mL via OROMUCOSAL
  Filled 2019-08-10: qty 15

## 2019-08-10 MED ORDER — POLYETHYLENE GLYCOL 3350 17 G PO PACK: 17.0000 g | PACK | Freq: Every day | ORAL | Status: DC | PRN

## 2019-08-10 MED ORDER — LISINOPRIL-HYDROCHLOROTHIAZIDE 20-12.5 MG PO TABS: 2.0000 | ORAL_TABLET | Freq: Every day | ORAL | Status: DC

## 2019-08-10 MED ORDER — BUPIVACAINE-EPINEPHRINE (PF) 0.5% -1:200000 IJ SOLN
INTRAMUSCULAR | Status: DC | PRN
Start: 2019-08-10 — End: 2019-08-10
  Administered 2019-08-10: 15 mL via PERINEURAL

## 2019-08-10 MED ORDER — BUPROPION HCL ER (SR) 150 MG PO TB12
150.0000 mg | ORAL_TABLET | Freq: Two times a day (BID) | ORAL | Status: DC
  Administered 2019-08-10 – 2019-08-11 (×2): 150 mg via ORAL
  Filled 2019-08-10 (×2): qty 1

## 2019-08-10 MED ORDER — MIDAZOLAM HCL 2 MG/2ML IJ SOLN
INTRAMUSCULAR | Status: AC
  Filled 2019-08-10: qty 2

## 2019-08-10 MED ORDER — PROPOFOL 10 MG/ML IV BOLUS
INTRAVENOUS | Status: AC
  Filled 2019-08-10: qty 20

## 2019-08-10 MED ORDER — LACTATED RINGERS IV SOLN
INTRAVENOUS | Status: DC | PRN
Start: 2019-08-10 — End: 2019-08-10

## 2019-08-10 MED ORDER — PHENYLEPHRINE HCL-NACL 10-0.9 MG/250ML-% IV SOLN
INTRAVENOUS | Status: DC | PRN
Start: 2019-08-10 — End: 2019-08-10
  Administered 2019-08-10: 50 ug/min via INTRAVENOUS

## 2019-08-10 MED ORDER — ROCURONIUM BROMIDE 10 MG/ML (PF) SYRINGE
PREFILLED_SYRINGE | INTRAVENOUS | Status: AC
  Filled 2019-08-10: qty 10

## 2019-08-10 MED ORDER — FENTANYL CITRATE (PF) 100 MCG/2ML IJ SOLN
100.0000 ug | Freq: Once | INTRAMUSCULAR | Status: AC
  Administered 2019-08-10: 100 ug via INTRAVENOUS
  Filled 2019-08-10: qty 2

## 2019-08-10 MED ORDER — SUGAMMADEX SODIUM 500 MG/5ML IV SOLN
INTRAVENOUS | Status: AC
  Filled 2019-08-10: qty 5

## 2019-08-10 MED ORDER — DIPHENHYDRAMINE HCL 12.5 MG/5ML PO ELIX: 12.5000 mg | ORAL_SOLUTION | ORAL | Status: DC | PRN

## 2019-08-10 MED ORDER — SUGAMMADEX SODIUM 500 MG/5ML IV SOLN
INTRAVENOUS | Status: DC | PRN
  Administered 2019-08-10: 210 mg via INTRAVENOUS

## 2019-08-10 MED ORDER — METOCLOPRAMIDE HCL 5 MG PO TABS: 5.0000 mg | ORAL_TABLET | Freq: Three times a day (TID) | ORAL | Status: DC | PRN

## 2019-08-10 MED ORDER — ACETAMINOPHEN 500 MG PO TABS
1000.0000 mg | ORAL_TABLET | Freq: Three times a day (TID) | ORAL | Status: DC
  Administered 2019-08-10 – 2019-08-11 (×3): 1000 mg via ORAL
  Filled 2019-08-10 (×3): qty 2

## 2019-08-10 MED ORDER — OXYCODONE HCL 5 MG PO TABS: 5.0000 mg | ORAL_TABLET | ORAL | Status: DC | PRN

## 2019-08-10 MED ORDER — ONDANSETRON HCL 4 MG PO TABS: 4.0000 mg | ORAL_TABLET | Freq: Four times a day (QID) | ORAL | Status: DC | PRN

## 2019-08-10 MED ORDER — ONDANSETRON HCL 4 MG/2ML IJ SOLN: 4.0000 mg | Freq: Once | INTRAMUSCULAR | Status: DC | PRN

## 2019-08-10 MED ORDER — ONDANSETRON HCL 4 MG/2ML IJ SOLN
INTRAMUSCULAR | Status: DC | PRN
  Administered 2019-08-10: 4 mg via INTRAVENOUS

## 2019-08-10 MED ORDER — METHOCARBAMOL 500 MG IVPB - SIMPLE MED
500.0000 mg | Freq: Four times a day (QID) | INTRAVENOUS | Status: DC | PRN
  Filled 2019-08-10: qty 50

## 2019-08-10 MED ORDER — METHOCARBAMOL 500 MG PO TABS: 500.0000 mg | ORAL_TABLET | Freq: Four times a day (QID) | ORAL | Status: DC | PRN

## 2019-08-10 MED ORDER — SODIUM CHLORIDE 0.9 % IR SOLN
Status: DC | PRN
  Administered 2019-08-10: 1000 mL

## 2019-08-10 MED ORDER — CEFAZOLIN SODIUM-DEXTROSE 1-4 GM/50ML-% IV SOLN
1.0000 g | Freq: Four times a day (QID) | INTRAVENOUS | Status: AC
  Administered 2019-08-10 – 2019-08-11 (×3): 1 g via INTRAVENOUS
  Filled 2019-08-10 (×3): qty 50

## 2019-08-10 MED ORDER — PROPOFOL 10 MG/ML IV BOLUS
INTRAVENOUS | Status: DC | PRN
  Administered 2019-08-10: 200 mg via INTRAVENOUS

## 2019-08-10 MED ORDER — LIDOCAINE 2% (20 MG/ML) 5 ML SYRINGE
INTRAMUSCULAR | Status: AC
  Filled 2019-08-10: qty 5

## 2019-08-10 MED ORDER — METOCLOPRAMIDE HCL 5 MG/ML IJ SOLN: 5.0000 mg | Freq: Three times a day (TID) | INTRAMUSCULAR | Status: DC | PRN

## 2019-08-10 MED ORDER — PHENOL 1.4 % MT LIQD: 1.0000 | OROMUCOSAL | Status: DC | PRN

## 2019-08-10 MED ORDER — ORAL CARE MOUTH RINSE: 15.0000 mL | Freq: Once | OROMUCOSAL | Status: AC

## 2019-08-10 MED ORDER — ROCURONIUM BROMIDE 10 MG/ML (PF) SYRINGE
PREFILLED_SYRINGE | INTRAVENOUS | Status: DC | PRN
  Administered 2019-08-10: 60 mg via INTRAVENOUS
  Administered 2019-08-10: 20 mg via INTRAVENOUS

## 2019-08-10 MED ORDER — BUPIVACAINE LIPOSOME 1.3 % IJ SUSP
INTRAMUSCULAR | Status: DC | PRN
  Administered 2019-08-10: 10 mL via PERINEURAL

## 2019-08-10 SURGICAL SUPPLY — 68 items
BASEPLATE SHOULDER FW 15D 29 (Joint) ×3 IMPLANT
BIT DRILL 3.2 PERIPHERAL SCREW (BIT) ×3 IMPLANT
BLADE SAW SAG 73X25 THK (BLADE) ×2
BLADE SAW SGTL 73X25 THK (BLADE) ×1 IMPLANT
CHLORAPREP W/TINT 26 (MISCELLANEOUS) ×6 IMPLANT
CLOSURE STERI-STRIP 1/2X4 (GAUZE/BANDAGES/DRESSINGS) ×1
CLOSURE WOUND 1/2 X4 (GAUZE/BANDAGES/DRESSINGS) ×1
CLSR STERI-STRIP ANTIMIC 1/2X4 (GAUZE/BANDAGES/DRESSINGS) ×2 IMPLANT
COOLER ICEMAN CLASSIC (MISCELLANEOUS) IMPLANT
COVER BACK TABLE 60X90IN (DRAPES) IMPLANT
COVER SURGICAL LIGHT HANDLE (MISCELLANEOUS) ×3 IMPLANT
COVER WAND RF STERILE (DRAPES) ×3 IMPLANT
DRAPE INCISE IOBAN 66X45 STRL (DRAPES) ×3 IMPLANT
DRAPE ORTHO SPLIT 77X108 STRL (DRAPES) ×4
DRAPE SHEET LG 3/4 BI-LAMINATE (DRAPES) ×6 IMPLANT
DRAPE SURG ORHT 6 SPLT 77X108 (DRAPES) ×2 IMPLANT
DRSG AQUACEL AG ADV 3.5X 6 (GAUZE/BANDAGES/DRESSINGS) ×3 IMPLANT
ELECT BLADE TIP CTD 4 INCH (ELECTRODE) ×3 IMPLANT
ELECT REM PT RETURN 15FT ADLT (MISCELLANEOUS) ×3 IMPLANT
GLENOSPHERE STANDARD 39 (Joint) ×3 IMPLANT
GLENOSPHERE STD 39 (Joint) ×1 IMPLANT
GLOVE BIO SURGEON STRL SZ 6.5 (GLOVE) ×4 IMPLANT
GLOVE BIO SURGEONS STRL SZ 6.5 (GLOVE) ×2
GLOVE BIOGEL PI IND STRL 6.5 (GLOVE) ×1 IMPLANT
GLOVE BIOGEL PI IND STRL 8 (GLOVE) ×1 IMPLANT
GLOVE BIOGEL PI INDICATOR 6.5 (GLOVE) ×2
GLOVE BIOGEL PI INDICATOR 8 (GLOVE) ×2
GLOVE ECLIPSE 8.0 STRL XLNG CF (GLOVE) ×6 IMPLANT
GOWN STRL REUS W/TWL LRG LVL3 (GOWN DISPOSABLE) ×3 IMPLANT
GOWN STRL REUS W/TWL XL LVL3 (GOWN DISPOSABLE) ×3 IMPLANT
GUIDEWIRE GLENOID 2.5X220 (WIRE) ×3 IMPLANT
HANDPIECE INTERPULSE COAX TIP (DISPOSABLE) ×2
HEMOSTAT SURGICEL 2X14 (HEMOSTASIS) IMPLANT
HUMERAL STEM AEQUALIS 3BX74MM (Stem) ×3 IMPLANT
IMPL REVERSE SHOULDER 0X3.5 (Shoulder) ×1 IMPLANT
IMPLANT REVERSE SHOULDER 0X3.5 (Shoulder) ×3 IMPLANT
INSERT REV KIT SHOULDER 6X39 (Screw) ×3 IMPLANT
KIT BASIN (CUSTOM PROCEDURE TRAY) ×3 IMPLANT
KIT STABILIZATION SHOULDER (MISCELLANEOUS) ×3 IMPLANT
KIT TURNOVER KIT A (KITS) IMPLANT
MANIFOLD NEPTUNE II (INSTRUMENTS) ×3 IMPLANT
NEEDLE MAYO CATGUT SZ4 (NEEDLE) IMPLANT
NS IRRIG 1000ML POUR BTL (IV SOLUTION) ×3 IMPLANT
PACK SHOULDER (CUSTOM PROCEDURE TRAY) ×3 IMPLANT
PAD COLD SHLDR WRAP-ON (PAD) IMPLANT
PENCIL SMOKE EVACUATOR (MISCELLANEOUS) IMPLANT
RESTRAINT HEAD UNIVERSAL NS (MISCELLANEOUS) ×3 IMPLANT
SCREW 5.5X26 (Screw) ×3 IMPLANT
SCREW CENTRAL THREAD 6.5X45 (Screw) ×3 IMPLANT
SCREW PERIPHERAL 30 (Screw) ×3 IMPLANT
SCREW PERIPHERAL 42 (Screw) ×3 IMPLANT
SCREW PERIPHERAL 5.0X34 (Screw) ×3 IMPLANT
SET HNDPC FAN SPRY TIP SCT (DISPOSABLE) ×1 IMPLANT
SLING ULTRA III MED (ORTHOPEDIC SUPPLIES) ×3 IMPLANT
STEM HUMERAL AEQUALIS 3BX74MM (Stem) ×1 IMPLANT
STRIP CLOSURE SKIN 1/2X4 (GAUZE/BANDAGES/DRESSINGS) ×2 IMPLANT
SUCTION FRAZIER HANDLE 12FR (TUBING) ×2
SUCTION TUBE FRAZIER 12FR DISP (TUBING) ×1 IMPLANT
SUT ETHIBOND 2 V 37 (SUTURE) ×3 IMPLANT
SUT ETHIBOND NAB CT1 #1 30IN (SUTURE) ×3 IMPLANT
SUT FIBERWIRE #5 38 CONV NDL (SUTURE) ×12
SUT MNCRL AB 4-0 PS2 18 (SUTURE) ×3 IMPLANT
SUT VIC AB 0 CT1 36 (SUTURE) ×3 IMPLANT
SUT VIC AB 3-0 SH 27 (SUTURE) ×2
SUT VIC AB 3-0 SH 27X BRD (SUTURE) ×1 IMPLANT
SUTURE FIBERWR #5 38 CONV NDL (SUTURE) ×4 IMPLANT
TOWEL OR 17X26 10 PK STRL BLUE (TOWEL DISPOSABLE) ×3 IMPLANT
WATER STERILE IRR 1000ML POUR (IV SOLUTION) ×6 IMPLANT

## 2019-08-10 NOTE — Op Note (Signed)
Orthopaedic Surgery Operative Note (CSN: [id number])  Bryce King  May 20, 1960 Date of Surgery: 08/10/2019   Diagnoses:  Right shoulder rotator cuff arthropathy  Procedure: Right augmented reverse total Shoulder Arthroplasty   Operative Finding Successful completion of planned procedure.  Great fixation with both the glenoid and the humeral component.  Robust subscapularis repair.  Teres minor was intact with the remainder of his cuff is torn.  Post-operative plan: The patient will be NWB in sling.  The patient will be will be admitted to observation due to medical complexity, monitoring and pain management.  DVT prophylaxis Aspirin 81 mg twice daily for 6 weeks.  Pain control with PRN pain medication preferring oral medicines.  Follow up plan will be scheduled in approximately 7 days for incision check and XR.  Physical therapy to start after first visit.  Implants: Tornier flex short size 3 stem, 0 high offset tray with a 6 poly-, 29 full wedge baseplate with a 45 center screw, 39 glenosphere  Post-Op Diagnosis: Same Surgeons:Primary: Hiram Gash, MD Assistants:Caroline McBane PA-C Location: [facility] ROOM 06 Anesthesia: General with Exparel Interscalene Antibiotics: Ancef 2g preop, Vancomycin 102m locally Tourniquet time: None Estimated Blood Loss: 1003Complications: None Specimens: None Implants: Implant Name Type Inv. Item Serial No. Manufacturer Lot No. LRB No. Used Action  BASEPLATE SHOULDER FW [id number] - [id number] BASEPLATE SHOULDER FW [id number] [employer]  Right 1 Implanted  SCREW CENTRAL THREAD 6.5X45 - [id number] SCREW CENTRAL THREAD 6.5X45  [employer]  Right 1 Implanted  SCREW PERIPHERAL 5.0X34 - [id number] SCREW PERIPHERAL 5.0X34  [employer]  Right 1 Implanted  SCREW PERIPHERAL 42 - [id number] SCREW PERIPHERAL 42  [employer]  Right 1 Implanted  SCREW PERIPHERAL 30 - [id number] SCREW PERIPHERAL 30  [employer]  Right 1 Implanted   GLENOSPHERE STANDARD 39 - [id number] GLENOSPHERE STANDARD 39 [id number]  Right 1 Implanted  SCREW 5.5X26 - [id number] SCREW 5.5X26  [employer]  Right 1 Implanted  IMPLANT REVERSE SHOULDER 0X3.5 - [id number] IMPLANT REVERSE SHOULDER 0X3.5 [id number]  Right 1 Implanted  INSERT REV KIT SHOULDER 6X39 - [id number] INSERT REV KIT SHOULDER 6X39 [id number]  Right 1 Implanted  HUMERAL STEM AEQUALIS 3BX74MM - [id number] HUMERAL STEM AEQUALIS 3BX74MM [id number]  Right 1 Implanted    Indications for Surgery:   Bryce Belskyis a 59y.o. male with previous mini open cuff repair that failed with no infectious history and end-stage rotator cuff arthropathy that the patient on very limiting.  Benefits and risks of operative and nonoperative management were discussed prior to surgery with patient/guardian(s) and informed consent form was completed.  Infection and need for further surgery were discussed as was prosthetic stability and cuff issues.  We additionally specifically discussed risks of axillary nerve injury, infection, periprosthetic fracture, continued pain and longevity of implants prior to beginning procedure.      Procedure:   The patient was identified in the preoperative holding area where the surgical site was marked. Block placed by anesthesia with exparel.  The patient was taken to the OR where a procedural timeout was called and the above noted anesthesia was induced.  The patient was positioned beachchair on allen table with spider arm positioner.  Preoperative antibiotics were dosed.  The patient's right shoulder was prepped and draped in the usual sterile fashion.  A second preoperative timeout was called.  Standard deltopectoral approach was performed with a #10 blade. We dissected down to the subcutaneous tissues and the cephalic vein was taken laterally with the deltoid. Clavipectoral fascia was  incised in line with the incision. Deep retractors were placed. The long of the biceps tendon was identified and there was significant tenosynovitis present.  Tenodesis was performed to the pectoralis tendon with #2 Ethibond. The remaining biceps was followed up into the rotator interval where it was released.   The subscapularis was taken down in a full thickness layer with capsule along the humeral neck extending inferiorly around the humeral head. We continued releasing the capsule directly off of the osteophytes inferiorly all the way around the corner. This allowed [place] to dislocate the humeral head.   The humeral head had evidence of severe osteoarthritic wear with full-thickness cartilage loss and exposed subchondral bone. There was significant flattening of the humeral head.   The rotator cuff was carefully examined and noted to be irreperably torn.  The decision was confirmed that a reverse total shoulder was indicated for this patient.  There were osteophytes along the inferior humeral neck. The osteophytes were removed with an osteotome and a rongeur.  Osteophytes were removed with a rongeur and an osteotome and the anatomic neck was well visualized.     A humeral cutting guide was inserted down the intramedullary canal. The version was set at 20 of retroversion. Humeral osteotomy was performed with an oscillating saw. The head fragment was passed off the back table. A starter awl was used to open the humeral canal. We next used T-handle straight sound reamers to ream up to an appropriate fit. A chisel was used to remove proximal humeral bone. We then broached starting with a size one broach and broaching up to 3 which obtained an appropriate fit. The broach handle was removed. A cut protector was placed. The broach handle was removed and a cut protector was placed. The humerus was retracted posteriorly and we turned our attention to glenoid exposure.  The subscapularis was again identified and  immediately we took care to palpate the axillary nerve anteriorly and verify its position with gentle palpation as well as the tug test.  We then released the SGHL with bovie cautery prior to placing a curved mayo at the junction of the anterior glenoid well above the axillary nerve and bluntly dissecting the subscapularis from the capsule.  We did not release the anterior capsule as we had good excursion without doing this.  An anterior deltoid retractor was then placed as well as a small Hohmann retractor superiorly.   The glenoid was inspected and had evidence of severe osteoarthritic wear with full-thickness cartilage loss and exposed subchondral bone. The remaining labrum was removed circumferentially taking great care not to disrupt the posterior capsule.   At this point we felt based on blueprint templating that a full wedge augment was necessary.  We began by using a full wedge guide to place our center pin as was templated.  We had good position of this pin and we proceeded with our starter center drill.  This allowed for [place] to use the 15 degree full wedge reamer obtaining circumferential witness marks and good bone preparation for ingrowth.  At this point we proceeded with our center drill and had an intact vault.  We then drilled our center screw to a length of 45 mm.    We selected a 6.5 mm x 45 mm screw and the full wedge baseplate which was  placed in the same orientation as our reaming.  We double checked that we had good apposition of the base plate to bone and then proceeded to place 3 locking screws and one nonlocking screw as is typical.  Next a 39 mm glenosphere was selected and impacted onto the baseplate. The center screw was tightened.  We turned attention back to the humeral side. The cut protector was removed. We trialed with multiple size tray and polyethylene options and selected a 6 which provided good stability and range of motion without excess soft tissue tension. The offset  was dialed in to match the normal anatomy. The shoulder was trialed.  There was good ROM in all planes and the shoulder was stable with no inferior translation.  The real humeral implants were opened after again confirming sizes.  The trial was removed. #5 Fiberwire x4 sutures passed through the humeral neck for subscap repair. The humeral component was press-fit obtaining a secure fit. A +0 high offset tray was selected and impacted onto the stem.  A 39+6 polyethylene liner was impacted onto the stem.  The joint was reduced and thoroughly irrigated with pulsatile lavage. Subscap was repaired back with #5 Fiberwire sutures through bone tunnels. Hemostasis was obtained. The deltopectoral interval was reapproximated with #1 Ethibond. The subcutaneous tissues were closed with 2-0 Vicryl and the skin was closed with running monocryl.    The wounds were cleaned and dried and an Aquacel dressing was placed. The drapes taken down. The arm was placed into sling with abduction pillow. Patient was awakened, extubated, and transferred to the recovery room in stable condition. There were no intraoperative complications. The sponge, needle, and attention counts were  correct at the end of the case.   Noemi Chapel, PA-C, present and scrubbed throughout the case, critical for completion in a timely fashion, and for retraction, instrumentation, closure.

## 2019-08-10 NOTE — Anesthesia Procedure Notes (Signed)
Anesthesia Regional Block: Interscalene brachial plexus block   Pre-Anesthetic Checklist: ,, timeout performed, Correct Patient, Correct Site, Correct Laterality, Correct Procedure, Correct Position, site marked, Risks and benefits discussed,  Surgical consent,  Pre-op evaluation,  At surgeon's request and post-op pain management  Laterality: Right  Prep: chloraprep       Needles:  Injection technique: Single-shot  Needle Type: Echogenic Stimulator Needle     Needle Length: 9cm  Needle Gauge: 21     Additional Needles:   Procedures:, nerve stimulator,,, ultrasound used (permanent image in chart),,,,   Nerve Stimulator or Paresthesia:  Response: deltoid and bicep, 0.5 mA,   Additional Responses:   Narrative:  Start time: 08/10/2019 11:13 AM End time: 08/10/2019 11:20 AM Injection made incrementally with aspirations every 5 mL.  Performed by: Personally  Anesthesiologist: Suzette Battiest, MD

## 2019-08-10 NOTE — Anesthesia Postprocedure Evaluation (Signed)
Anesthesia Post Note  Patient: Dairon Sposato  Procedure(s) Performed: REVERSE SHOULDER ARTHROPLASTY (Right Shoulder)     Patient location during evaluation: PACU Anesthesia Type: General and Regional Level of consciousness: awake and alert Pain management: pain level controlled Vital Signs Assessment: post-procedure vital signs reviewed and stable Respiratory status: spontaneous breathing, nonlabored ventilation, respiratory function stable and patient connected to nasal cannula oxygen Cardiovascular status: blood pressure returned to baseline and stable Postop Assessment: no apparent nausea or vomiting Anesthetic complications: no    Last Vitals:  Vitals:   08/10/19 1530 08/10/19 1547  BP: 124/84 (!) 146/83  Pulse:  78  Resp:  20  Temp:  36.8 C  SpO2:  98%    Last Pain:  Vitals:   08/10/19 1547  TempSrc: Oral  PainSc:                  Karys Meckley P Simeon Vera

## 2019-08-10 NOTE — Plan of Care (Signed)
Plan of care 

## 2019-08-10 NOTE — Progress Notes (Signed)
Assisted Dr. Rodman Comp with right, ultrasound guided, interscalene block using Exparel. Side rails up, monitors on throughout procedure. See vital signs in flow sheet. Tolerated Procedure well.

## 2019-08-10 NOTE — Transfer of Care (Signed)
Immediate Anesthesia Transfer of Care Note  Patient: Bryce King  Procedure(s) Performed: REVERSE SHOULDER ARTHROPLASTY (Right Shoulder)  Patient Location: PACU  Anesthesia Type:GA combined with regional for post-op pain  Level of Consciousness: awake, alert , oriented and patient cooperative  Airway & Oxygen Therapy: Patient Spontanous Breathing and Patient connected to face mask oxygen  Post-op Assessment: Report given to RN and Post -op Vital signs reviewed and stable  Post vital signs: Reviewed and stable  Last Vitals:  Vitals Value Taken Time  BP 148/87 08/10/19 1352  Temp    Pulse 79 08/10/19 1355  Resp 16 08/10/19 1355  SpO2 100 % 08/10/19 1355  Vitals shown include unvalidated device data.  Last Pain:  Vitals:   08/10/19 1135  TempSrc:   PainSc: 0-No pain      Patients Stated Pain Goal: 5 ([id number])  Complications: No apparent anesthesia complications

## 2019-08-10 NOTE — Interval H&P Note (Signed)
History and Physical Interval Note:  08/10/2019 10:56 AM  Bryce King  has presented today for surgery, with the diagnosis of DJD RIGHT SHOULDER.  The various methods of treatment have been discussed with the patient and family. After consideration of risks, benefits and other options for treatment, the patient has consented to  Procedure(s): REVERSE SHOULDER ARTHROPLASTY (Right) as a surgical intervention.  The patient's history has been reviewed, patient examined, no change in status, stable for surgery.  I have reviewed the patient's chart and labs.  Questions were answered to the patient's satisfaction.     Hiram Gash

## 2019-08-10 NOTE — Anesthesia Procedure Notes (Signed)
Procedure Name: Intubation Date/Time: 08/10/2019 12:06 PM Performed by: West Pugh, CRNA Pre-anesthesia Checklist: Patient identified, Emergency Drugs available, Suction available, Patient being monitored and Timeout performed Patient Re-evaluated:Patient Re-evaluated prior to induction Oxygen Delivery Method: Circle system utilized Preoxygenation: Pre-oxygenation with 100% oxygen Induction Type: IV induction Ventilation: Mask ventilation without difficulty Laryngoscope Size: Mac and 4 Grade View: Grade I Tube type: Oral Tube size: 7.5 mm Number of attempts: 1 Airway Equipment and Method: Stylet Placement Confirmation: ETT inserted through vocal cords under direct vision,  positive ETCO2,  CO2 detector and breath sounds checked- equal and bilateral Secured at: 22 cm Tube secured with: Tape Dental Injury: Teeth and Oropharynx as per pre-operative assessment

## 2019-08-11 ENCOUNTER — Encounter: Payer: Self-pay | Admitting: *Deleted

## 2019-08-11 DIAGNOSIS — M19011 Primary osteoarthritis, right shoulder: Secondary | ICD-10-CM | POA: Diagnosis not present

## 2019-08-11 MED ORDER — GABAPENTIN 100 MG PO CAPS: 100.0000 mg | ORAL_CAPSULE | Freq: Three times a day (TID) | ORAL | 0 refills | Status: DC

## 2019-08-11 MED ORDER — CELECOXIB 200 MG PO CAPS
200.0000 mg | ORAL_CAPSULE | Freq: Two times a day (BID) | ORAL | 0 refills | Status: AC
Start: 2019-08-11 — End: 2019-09-10

## 2019-08-11 MED ORDER — ACETAMINOPHEN 500 MG PO TABS
1000.0000 mg | ORAL_TABLET | Freq: Three times a day (TID) | ORAL | 0 refills | Status: AC
Start: 2019-08-11 — End: 2019-08-25

## 2019-08-11 MED ORDER — ONDANSETRON HCL 4 MG PO TABS: 4.0000 mg | ORAL_TABLET | Freq: Three times a day (TID) | ORAL | 1 refills | Status: AC | PRN

## 2019-08-11 MED ORDER — ASPIRIN 81 MG PO CHEW
81.0000 mg | CHEWABLE_TABLET | Freq: Two times a day (BID) | ORAL | 0 refills | Status: AC
Start: 2019-08-11 — End: 2019-09-22

## 2019-08-11 MED ORDER — OXYCODONE HCL 5 MG PO TABS: ORAL_TABLET | ORAL | 0 refills | Status: AC

## 2019-08-11 NOTE — Plan of Care (Signed)
resolved 

## 2019-08-11 NOTE — Discharge Summary (Signed)
Patient ID: Bryce King MRN: [id number] DOB/AGE: 58-Jul-1962 58 y.o.  Admit date: 08/10/2019 Discharge date: 08/11/2019  Admission Diagnoses: Right shoulder rotator cuff arthropathy  Discharge Diagnoses:  Active Problems:   Arthritis of right glenohumeral joint   Past Medical History:  Diagnosis Date  . Arthritis   . Dizziness   . Heart palpitations   . Hyperlipidemia   . Hypertension   . OSA (obstructive sleep apnea)    Uses CPAP    Procedures Performed: Right augmented reverse total Shoulder Arthroplasty  Discharged Condition: good/stable  Hospital Course: Patient brought in to [facility] for scheduled procedure.  He tolerated procedure well.  He was kept for monitoring overnight for pain control and medical monitoring postop. He was found to be stable for DC home the morning after surgery.  Patient was instructed on specific activity restrictions and all questions were answered.  Consults: OT  Significant Diagnostic Studies: No additional pertinent studies  Treatments: Surgery  Discharge Exam: Right upper extremity: Dressing CDI - minimal strike through noted. Sling well fitting. Full and painless ROM throughout hand with DPC of 0.  Axillary nerve sensation/motor altered in setting of block and unable to be fully tested.  He endorses axillary nerve sensation.  Distal motor and sensory altered in setting of block. He endorses distal sensation. Able to wiggle his fingers. Warm well perfused hand.   Disposition: Discharge disposition: 01-Home or Self Care     Follow-up: 1 week in office with Dr. Griffin Basil for incision check and x-ray.   Discharge Instructions    Call MD for:  redness, tenderness, or signs of infection (pain, swelling, redness, odor or green/yellow discharge around incision site)   Complete by: As directed    Call MD for:  severe uncontrolled pain   Complete by: As directed    Call MD for:  temperature >100.4   Complete by: As  directed    Diet - low sodium heart healthy   Complete by: As directed    Discharge instructions   Complete by: As directed    Ophelia Charter MD, MPH Noemi Chapel, PA-C [place]. [address], [address] [phone] (tel)   [phone] (fax)   POST-OPERATIVE INSTRUCTIONS - TOTAL SHOULDER REPLACEMENT    WOUND CARE - You may leave the operative dressing in place until your follow-up appointment. - [place]. - There may be a small amount of fluid/bleeding leaking at the surgical site. This is normal after surgery.  - If it fills with liquid or blood please call us immediately to change it for you. - Use the provided ice machine or Ice packs as often as possible for the first 3-4 days, then as needed for pain relief.  Keep a layer of cloth or a shirt between your skin and the cooling unit to prevent frost bite as it can get very cold.  SHOWERING: - You may shower on Post-Op Day #2.  - The dressing is water resistant but do not scrub it as it may start to peel up.   - You may remove the sling for showering, but keep a water resistant pillow under the arm to keep both the  elbow and shoulder away from the body (mimicking the abduction sling).  - Gently pat the area dry.  - Do not soak the shoulder in water. Do not go swimming in the pool or ocean until your sutures are removed. - KEEP THE INCISIONS CLEAN AND DRY.  EXERCISES - Wear the sling at all times except when doing your exercises.  - You may remove the sling for showering, but keep the arm across the chest or in a secondary sling.    - Accidental/Purposeful External Rotation and shoulder flexion (reaching behind you) is to be avoided at all costs for the first month. - It is ok to come out of your sling if your are sitting and have assistance for eating.  Do not lift anything heavier than 1 pound until we discuss it further in clinic.  Please perform the exercises:   Elbow / Hand  / Wrist  Range of Motion Exercises Grip strengthening   REGIONAL ANESTHESIA (NERVE BLOCKS) The anesthesia team may have performed a nerve block for you if safe in the setting of your care.  This is a great tool used to minimize pain.  Typically the block may start wearing off overnight but the long acting medicine may last for 3-4 days.  The nerve block wearing off can be a challenging period but please utilize your as needed pain medications to try and manage this period.    POST-OP MEDICATIONS- Multimodal approach to pain control In general your pain will be controlled with a combination of substances.  Prescriptions unless otherwise discussed are electronically sent to your pharmacy.  This is a carefully made plan we use to minimize narcotic use.    - Meloxicam OR Celebrex - Anti-inflammatory medication taken on a scheduled basis   - Celebrex 200 mg 1 tablet twice daily - Acetaminophen - Non-narcotic pain medicine taken on a scheduled basis   - 1,000 mg every 8 hours - Gabapentin - this is a medication to help with pain control, take on a scheduled basis  - 100 mg every 8 hours - Oxycodone - This is a strong narcotic, to be used only on an "as needed" basis for pain. Aspirin 81mg  - This medicine is used to minimize the risk of blood clots after surgery.  - 1 tablet twice a day Zofran -  take as needed for nausea  Meloxicam/Celebrex - these are anti-inflammatory and pain relievers.  Do not take additional ibuprofen, naproxen or other NSAID while taking this medicine.   FOLLOW-UP If you develop a Fever (>101.5), Redness or Drainage from the surgical incision site, please call our office to arrange for an evaluation. Please call the office to schedule a follow-up appointment for a wound check, 7-10 days post-operatively.  IF YOU HAVE ANY QUESTIONS, PLEASE FEEL FREE TO CALL OUR OFFICE.  HELPFUL INFORMATION  If you had a block, it will wear off between 8-24 hrs postop typically.  This is  period when your pain may go from nearly zero to the pain you would have had post-op without the block.  This is an abrupt transition but nothing dangerous is happening.  You may take an extra dose of narcotic when this happens.  Your arm will be in a sling following surgery. You will be in this sling for the next 3-4 weeks.  I will let you know the exact duration at your follow-up visit.  You may be more comfortable sleeping in a semi-seated position the first few nights following surgery.  Keep a pillow propped under the elbow and forearm for comfort.  If you have a recliner type of chair it might be beneficial.  If not that is fine too, but it would be helpful to sleep propped up with pillows behind your operated shoulder as well under  your elbow and forearm.  This will reduce pulling on the suture lines.  When dressing, put your operative arm in the sleeve first.  When getting undressed, take your operative arm out last.  Loose fitting, button-down shirts are recommended.  In most states it is against the law to drive while your arm is in a sling. And certainly against the law to drive while taking narcotics.  You may return to work/school in the next couple of days when you feel up to it. Desk work and typing in the sling is fine.  We suggest you use the pain medication the first night prior to going to bed, in order to ease any pain when the anesthesia wears off. You should avoid taking pain medications on an empty stomach as it will make you nauseous.  Do not drink alcoholic beverages or take illicit drugs when taking pain medications.  Pain medication may make you constipated.  Below are a few solutions to try in this order: Decrease the amount of pain medication if you aren't having pain. Drink lots of decaffeinated fluids. Drink prune juice and/or each dried prunes  If the first 3 don't work start with additional solutions Take Colace - an over-the-counter stool softener Take Senokot  - an over-the-counter laxative Take Miralax - a stronger over-the-counter laxative   Dental Antibiotics:  In most cases prophylactic antibiotics for Dental procdeures after total joint surgery are not necessary.  Exceptions are as follows:  1. History of prior total joint infection  2. Severely immunocompromised (Organ Transplant, cancer chemotherapy, Rheumatoid biologic meds such as [place])  3. Poorly controlled diabetes (A1C &gt; 8.0, blood glucose over 200)  If you have one of these conditions, contact your surgeon for an antibiotic prescription, prior to your dental procedure.     Allergies as of 08/11/2019      Reactions   Statins Other (See Comments)   Muscle aches      Medication List    STOP taking these medications   aspirin EC 81 MG tablet Replaced by: aspirin 81 MG chewable tablet   HYDROcodone-acetaminophen 10-325 MG tablet Commonly known as: NORCO     TAKE these medications   acetaminophen 500 MG tablet Commonly known as: TYLENOL Take 2 tablets (1,000 mg total) by mouth every 8 (eight) hours for 14 days. What changed:   when to take this  reasons to take this   aspirin 81 MG chewable tablet Commonly known as: Aspirin Childrens Chew 1 tablet (81 mg total) by mouth 2 (two) times daily. For 6 weeks. To help prevent blood clots after surgery Replaces: aspirin EC 81 MG tablet   buPROPion 150 MG 12 hr tablet Commonly known as: WELLBUTRIN SR TAKE 1 TABLET EVERY DAY FOR 3 DAYS THEN INCREASE TO 1 TABLET TWICE A DAY What changed: See the new instructions.   celecoxib 200 MG capsule Commonly known as: CeleBREX Take 1 capsule (200 mg total) by mouth 2 (two) times daily. What changed:   when to take this  reasons to take this   ezetimibe 10 MG tablet Commonly known as: ZETIA TAKE ONE TABLET BY MOUTH ONE TIME DAILY   gabapentin 100 MG capsule Commonly known as: Neurontin Take 1 capsule (100 mg total) by mouth 3 (three) times daily for 14 days. For  pain.   levocetirizine 5 MG tablet Commonly known as: XYZAL Take 5 mg by mouth every evening.   lisinopril-hydrochlorothiazide 20-12.5 MG tablet Commonly known as: ZESTORETIC Take 2 tablets by mouth  daily.   mometasone 0.1 % cream Commonly known as: Elocon Apply 1 application topically daily. What changed:   when to take this  reasons to take this   multivitamin with minerals Tabs tablet Take 1 tablet by mouth daily.   nitroGLYCERIN 0.4 MG SL tablet Commonly known as: NITROSTAT Place 0.4 mg under the tongue every 5 (five) minutes as needed for chest pain.   ondansetron 4 MG tablet Commonly known as: Zofran Take 1 tablet (4 mg total) by mouth every 8 (eight) hours as needed for up to 7 days for nausea or vomiting.   oxyCODONE 5 MG immediate release tablet Commonly known as: Oxy IR/ROXICODONE Take 1-2 pills every 6 hrs as needed for pain, no more than 6 per day   propranolol 10 MG tablet Commonly known as: INDERAL Take 1 tablet (10 mg total) by mouth daily as needed (for palpitations).   zolpidem 5 MG tablet Commonly known as: AMBIEN Take 5 mg by mouth at bedtime.

## 2019-08-11 NOTE — Evaluation (Signed)
Occupational Therapy Evaluation Patient Details Name: Bryce King MRN: [id number] DOB: 1960-04-05 Today's Date: 08/11/2019    History of Present Illness Patient is a 59 year old male s/p Right augmented reverse total Shoulder Arthroplasty   Clinical Impression   Patient educated in post op shoulder instructions, exercises and compensatory strategies for ADLs and provided handouts. Patient verbalize/ demonstrate understanding.    Follow Up Recommendations  Follow surgeon's recommendation for DC plan and follow-up therapies    Equipment Recommendations  None recommended by OT       Precautions / Restrictions Precautions Precautions: Shoulder Type of Shoulder Precautions: AROM to elbow wrist and hand ok, P/AROM shoulder No Shoulder Interventions: Shoulder sling/immobilizer;Off for dressing/bathing/exercises;Shoulder abduction pillow Precaution Booklet Issued: Yes (comment) Required Braces or Orthoses: Sling Restrictions Weight Bearing Restrictions: Yes RUE Weight Bearing: Non weight bearing      Mobility Bed Mobility Overal bed mobility: Modified Independent                Transfers Overall transfer level: Needs assistance Equipment used: None Transfers: Sit to/from Stand Sit to Stand: Supervision              Balance Overall balance assessment: Mild deficits observed, not formally tested                                         ADL either performed or assessed with clinical judgement   ADL Overall ADL's : Needs assistance/impaired     Grooming: Set up;Sitting   Upper Body Bathing: Supervision/ safety;Sitting;Cueing for compensatory techniques   Lower Body Bathing: Supervison/ safety;Sit to/from stand   Upper Body Dressing : Minimal assistance;Sitting;Cueing for compensatory techniques Upper Body Dressing Details (indicate cue type and reason): min A to initiate threading due to numbness in R UE Lower Body Dressing:  Supervision/safety;Sit to/from stand   Toilet Transfer: Supervision/safety;Ambulation           Functional mobility during ADLs: Supervision/safety                    Pertinent Vitals/Pain Pain Assessment: Faces Faces Pain Scale: Hurts a little bit Pain Location: R shoulder Pain Descriptors / Indicators: Aching;Grimacing Pain Intervention(s): Monitored during session;Premedicated before session     Hand Dominance Right   Extremity/Trunk Assessment Upper Extremity Assessment Upper Extremity Assessment: RUE deficits/detail RUE Deficits / Details: + nerve block, intact digit ROM    Lower Extremity Assessment Lower Extremity Assessment: Overall WFL for tasks assessed   Cervical / Trunk Assessment Cervical / Trunk Assessment: Normal   Communication Communication Communication: No difficulties   Cognition Arousal/Alertness: Awake/alert Behavior During Therapy: WFL for tasks assessed/performed Overall Cognitive Status: Within Functional Limits for tasks assessed                                        Exercises Exercises: Shoulder   Shoulder Instructions Shoulder Instructions Donning/doffing shirt without moving shoulder: Minimal assistance;Patient able to independently direct caregiver Method for sponge bathing under operated UE: Supervision/safety;Patient able to independently direct caregiver Donning/doffing sling/immobilizer: Minimal assistance;Patient able to independently direct caregiver Correct positioning of sling/immobilizer: Independent;Patient able to independently direct caregiver ROM for elbow, wrist and digits of operated UE: Independent;Patient able to independently direct caregiver Sling wearing schedule (on at all times/off for ADL's): Independent;Patient able to  independently direct caregiver Proper positioning of operated UE when showering: Supervision/safety;Patient able to independently direct caregiver Positioning of UE while  sleeping: Patient able to independently direct caregiver    Home Living Family/patient expects to be discharged to:: Private residence Living Arrangements: Spouse/significant other Available Help at Discharge: Family Type of Home: House Home Access: Level entry     Home Layout: Two level     Bathroom Shower/Tub: Tub/shower unit;Walk-in shower         Home Equipment: None          Prior Functioning/Environment Level of Independence: Independent                 OT Problem List: Pain;Impaired UE functional use         OT Goals(Current goals can be found in the care plan section) Acute Rehab OT Goals Patient Stated Goal: no more numbness OT Goal Formulation: With patient Time For Goal Achievement: 08/25/19 Potential to Achieve Goals: Good   AM-PAC OT "6 Clicks" Daily Activity     Outcome Measure Help from another person eating meals?: A Little Help from another person taking care of personal grooming?: A Little Help from another person toileting, which includes using toliet, bedpan, or urinal?: A Little Help from another person bathing (including washing, rinsing, drying)?: A Little Help from another person to put on and taking off regular upper body clothing?: A Little Help from another person to put on and taking off regular lower body clothing?: A Little 6 Click Score: 18   End of Session Equipment Utilized During Treatment: Other (comment)(sling)  Activity Tolerance: Patient tolerated treatment well Patient left: in chair;with call bell/phone within reach  OT Visit Diagnosis: Pain Pain - Right/Left: Right Pain - part of body: Shoulder                Time: [id number] OT Time Calculation (min): 21 min Charges:  OT General Charges $OT Visit: 1 Visit OT Evaluation $OT Eval Moderate Complexity: 1 Mod  Delbert Phenix OT Pager: [place] 08/11/2019, 8:40 AM

## 2019-08-25 ENCOUNTER — Ambulatory Visit: Payer: Managed Care, Other (non HMO) | Admitting: Physical Therapy

## 2019-09-05 ENCOUNTER — Other Ambulatory Visit: Payer: Self-pay

## 2019-09-05 MED ORDER — BUPROPION HCL ER (SR) 150 MG PO TB12: 150.0000 mg | ORAL_TABLET | Freq: Two times a day (BID) | ORAL | 0 refills | Status: DC

## 2019-09-19 ENCOUNTER — Encounter: Payer: Self-pay | Admitting: Family Medicine

## 2019-10-26 ENCOUNTER — Ambulatory Visit
Admission: RE | Admit: 2019-10-26 | Discharge: 2019-10-26 | Disposition: A | Discharge status: Home / Self Care | Payer: Managed Care, Other (non HMO) | Source: Ambulatory Visit | Attending: Orthopaedic Surgery | Admitting: Orthopaedic Surgery

## 2019-10-26 ENCOUNTER — Other Ambulatory Visit: Payer: Self-pay | Admitting: Orthopaedic Surgery

## 2019-10-26 DIAGNOSIS — R52 Pain, unspecified: Secondary | ICD-10-CM

## 2019-11-01 ENCOUNTER — Other Ambulatory Visit: Payer: Self-pay | Admitting: Family Medicine

## 2019-11-27 ENCOUNTER — Other Ambulatory Visit: Payer: Self-pay | Admitting: Family Medicine

## 2019-11-29 ENCOUNTER — Other Ambulatory Visit: Payer: Self-pay | Admitting: Family Medicine

## 2019-11-29 ENCOUNTER — Encounter: Payer: Self-pay | Admitting: Family Medicine

## 2019-11-29 DIAGNOSIS — Z1211 Encounter for screening for malignant neoplasm of colon: Secondary | ICD-10-CM

## 2019-11-29 NOTE — Telephone Encounter (Signed)
Orders entered

## 2019-12-02 ENCOUNTER — Encounter: Payer: Self-pay | Admitting: Gastroenterology

## 2019-12-06 ENCOUNTER — Other Ambulatory Visit: Payer: Self-pay

## 2019-12-06 MED ORDER — BUPROPION HCL ER (SR) 150 MG PO TB12: 150.0000 mg | ORAL_TABLET | Freq: Two times a day (BID) | ORAL | 0 refills | Status: DC

## 2019-12-06 NOTE — Addendum Note (Signed)
Addended by: Peggye Ley on: 12/06/2019 04:45 PM   Modules accepted: Orders

## 2020-01-12 ENCOUNTER — Ambulatory Visit (AMBULATORY_SURGERY_CENTER): Payer: Self-pay | Admitting: *Deleted

## 2020-01-12 ENCOUNTER — Other Ambulatory Visit: Payer: Self-pay

## 2020-01-12 ENCOUNTER — Encounter: Payer: Self-pay | Admitting: Gastroenterology

## 2020-01-12 VITALS — Ht 72.0 in | Wt 222.0 lb

## 2020-01-12 DIAGNOSIS — Z1211 Encounter for screening for malignant neoplasm of colon: Secondary | ICD-10-CM

## 2020-01-12 MED ORDER — SUTAB 1479-225-188 MG PO TABS: 24.0000 | ORAL_TABLET | ORAL | 0 refills | Status: DC

## 2020-01-12 NOTE — Progress Notes (Signed)

## 2020-01-25 ENCOUNTER — Encounter: Payer: Self-pay | Admitting: Gastroenterology

## 2020-01-25 ENCOUNTER — Other Ambulatory Visit: Payer: Self-pay

## 2020-01-25 ENCOUNTER — Ambulatory Visit (AMBULATORY_SURGERY_CENTER): Payer: Managed Care, Other (non HMO) | Admitting: Gastroenterology

## 2020-01-25 VITALS — BP 126/68 | HR 69 | Temp 99.1°F | Resp 17 | Ht 72.0 in | Wt 222.0 lb

## 2020-01-25 DIAGNOSIS — D128 Benign neoplasm of rectum: Secondary | ICD-10-CM

## 2020-01-25 DIAGNOSIS — K635 Polyp of colon: Secondary | ICD-10-CM

## 2020-01-25 DIAGNOSIS — K640 First degree hemorrhoids: Secondary | ICD-10-CM

## 2020-01-25 DIAGNOSIS — D125 Benign neoplasm of sigmoid colon: Secondary | ICD-10-CM

## 2020-01-25 DIAGNOSIS — K621 Rectal polyp: Secondary | ICD-10-CM

## 2020-01-25 DIAGNOSIS — Z1211 Encounter for screening for malignant neoplasm of colon: Secondary | ICD-10-CM

## 2020-01-25 DIAGNOSIS — D122 Benign neoplasm of ascending colon: Secondary | ICD-10-CM

## 2020-01-25 DIAGNOSIS — K5730 Diverticulosis of large intestine without perforation or abscess without bleeding: Secondary | ICD-10-CM

## 2020-01-25 MED ORDER — SODIUM CHLORIDE 0.9 % IV SOLN: 500.0000 mL | Freq: Once | INTRAVENOUS | Status: DC

## 2020-01-25 NOTE — Op Note (Signed)
[place] Patient Name: Bryce King Procedure Date: 01/25/2020 9:26 AM MRN: [id number] Endoscopist: Gerrit Heck , MD Age: 59 Referring MD:  Date of Birth: November 05, 1960 Gender: Male Account #: [id number] Procedure:                Colonoscopy Indications:              Screening for colorectal malignant neoplasm, This                            is the patient's first colonoscopy Medicines:                Monitored Anesthesia Care Procedure:                After obtaining informed consent, the colonoscope                            was passed under direct vision. Throughout the                            procedure, the patient's blood pressure, pulse, and                            oxygen saturations were monitored continuously. The                            Colonoscope was introduced through the anus and                            advanced to the the cecum, identified by                            appendiceal orifice and ileocecal valve. The                            colonoscopy was performed without difficulty. The                            patient tolerated the procedure well. The quality                            of the bowel preparation was good. The ileocecal                            valve, appendiceal orifice, and rectum were                            photographed. Scope In: 9:40:06 AM Scope Out: 10:12:36 AM Scope Withdrawal Time: 0 hours 23 minutes 44 seconds  Total Procedure Duration: 0 hours 32 minutes 30 seconds  Findings:                 The perianal and digital rectal examinations were                            normal.  A 15 mm polyp was found in the ascending colon. The                            polyp was semi-sessile with adherent mucus cap. The                            polyp was removed with a hot snare. Resection and                            retrieval were complete. Estimated blood loss: none.                            Five sessile polyps were found in the sigmoid colon                            (4) and ascending colon. The polyps were 3 to 5 mm                            in size. These polyps were removed with a cold                            snare. Resection and retrieval were complete.                            Estimated blood loss was minimal.                           Multiple sessile polyps were found in the rectum.                            The polyps were 1 to 3 mm in size. Several of these                            polyps were removed with a cold snare for                            histologic representative evaluation. Resection and                            retrieval were complete. Estimated blood loss was                            minimal.                           A few small-mouthed diverticula were found in the                            sigmoid colon.                           Non-bleeding internal hemorrhoids were found during  retroflexion. The hemorrhoids were small. Complications:            No immediate complications. Estimated Blood Loss:     Estimated blood loss was minimal. Impression:               - One 15 mm polyp in the ascending colon, removed                            with a hot snare. Resected and retrieved.                           - Five 3 to 5 mm polyps in the sigmoid colon and in                            the ascending colon, removed with a cold snare.                            Resected and retrieved.                           - Multiple 1 to 3 mm polyps in the rectum, removed                            with a cold snare. Resected and retrieved.                           - Diverticulosis in the sigmoid colon.                           - Non-bleeding internal hemorrhoids. Recommendation:           - Patient has a contact number available for                            emergencies. The signs and symptoms of potential                             delayed complications were discussed with the                            patient. Return to normal activities tomorrow.                            Written discharge instructions were provided to the                            patient.                           - Resume previous diet.                           - Continue present medications.                           - Await pathology results.                           -  Repeat colonoscopy in 3 years for surveillance,                            or sooner based on pathology results.                           - Return to GI clinic PRN.                           - Use fiber, for example Citrucel, Fibercon, Konsyl                            or Metamucil.                           - Internal hemorrhoids were noted on this study and                            may be amenable to hemorrhoid band ligation. If you                            are interested in further treatment of these                            hemorrhoids with band ligation, please contact my                            clinic to set up an appointment for evaluation and                            treatment. Gerrit Heck, MD 01/25/2020 10:20:06 AM

## 2020-01-25 NOTE — Progress Notes (Signed)
A/ox3, pleased with MAC, report to RN 

## 2020-01-25 NOTE — Progress Notes (Signed)
VS by JD.   No changes to medical or social hx since previsit.

## 2020-01-25 NOTE — Progress Notes (Signed)
Called to room to assist during endoscopic procedure.  Patient ID and intended procedure confirmed with present staff. Received instructions for my participation in the procedure from the performing physician.  

## 2020-01-25 NOTE — Patient Instructions (Signed)
YOU HAD AN ENDOSCOPIC PROCEDURE TODAY AT [facility]:   Refer to the procedure report that was given to you for any specific questions about what was found during the examination.  If the procedure report does not answer your questions, please call your gastroenterologist to clarify.  If you requested that your care partner not be given the details of your procedure findings, then the procedure report has been included in a sealed envelope for you to review at your convenience later.  YOU SHOULD EXPECT: Some feelings of bloating in the abdomen. Passage of more gas than usual.  Walking can help get rid of the air that was put into your GI tract during the procedure and reduce the bloating. If you had a lower endoscopy (such as a colonoscopy or flexible sigmoidoscopy) you may notice spotting of blood in your stool or on the toilet paper. If you underwent a bowel prep for your procedure, you may not have a normal bowel movement for a few days.  Please Note:  You might notice some irritation and congestion in your nose or some drainage.  This is from the oxygen used during your procedure.  There is no need for concern and it should clear up in a day or so.  SYMPTOMS TO REPORT IMMEDIATELY:   Following lower endoscopy (colonoscopy or flexible sigmoidoscopy):  Excessive amounts of blood in the stool  Significant tenderness or worsening of abdominal pains  Swelling of the abdomen that is new, acute  Fever of 100F or higher  For urgent or emergent issues, a gastroenterologist can be reached at any hour by calling [phone]. Do not use MyChart messaging for urgent concerns.    DIET:  We do recommend a small meal at first, but then you may proceed to your regular diet.  Drink plenty of fluids but you should avoid alcoholic beverages for 24 hours.  ACTIVITY:  You should plan to take it easy for the rest of today and you should NOT DRIVE or use heavy machinery until tomorrow (because  of the sedation medicines used during the test).    FOLLOW UP: Our staff will call the number listed on your records 48-72 hours following your procedure to check on you and address any questions or concerns that you may have regarding the information given to you following your procedure. If we do not reach you, we will leave a message.  We will attempt to reach you two times.  During this call, we will ask if you have developed any symptoms of COVID 19. If you develop any symptoms (ie: fever, flu-like symptoms, shortness of breath, cough etc.) before then, please call [phone].  If you test positive for Covid 19 in the 2 weeks post procedure, please call and report this information to [place].    If any biopsies were taken you will be contacted by phone or by letter within the next 1-3 weeks.  Please call us at [phone] if you have not heard about the biopsies in 3 weeks.    SIGNATURES/CONFIDENTIALITY: You and/or your care partner have signed paperwork which will be entered into your electronic medical record.  These signatures attest to the fact that that the information above on your After Visit Summary has been reviewed and is understood.  Full responsibility of the confidentiality of this discharge information lies with you and/or your care-partner.   Please read over handouts given- polyps, diverticulosis, hemorrhoids and hemorrhoid banding  Use fiber (over the counter)  Like Citrucel, Fibercon, Konsyl or Metamucil  Continue your normal medications  Await pathology

## 2020-01-27 ENCOUNTER — Telehealth: Payer: Self-pay

## 2020-01-27 NOTE — Telephone Encounter (Signed)
  Follow up Call-  Call back number 01/25/2020  Post procedure Call Back phone  # [phone]  Permission to leave phone message Yes     Patient questions:  Do you have a fever, pain , or abdominal swelling? No. Pain Score  0 *  Have you tolerated food without any problems? Yes.    Have you been able to return to your normal activities? Yes.    Do you have any questions about your discharge instructions: Diet   No. Medications  No. Follow up visit  No.  Do you have questions or concerns about your Care? No.  Actions: * If pain score is 4 or above: No action needed, pain <4. 1. Have you developed a fever since your procedure? no  2.   Have you had an respiratory symptoms (SOB or cough) since your procedure? no  3.   Have you tested positive for COVID 19 since your procedure no  4.   Have you had any family members/close contacts diagnosed with the COVID 19 since your procedure?  no   If yes to any of these questions please route to Joylene John, RN and Joella Prince, RN

## 2020-02-02 ENCOUNTER — Encounter: Payer: Self-pay | Admitting: Gastroenterology

## 2020-02-03 ENCOUNTER — Ambulatory Visit (INDEPENDENT_AMBULATORY_CARE_PROVIDER_SITE_OTHER): Payer: Managed Care, Other (non HMO) | Admitting: Family Medicine

## 2020-02-03 ENCOUNTER — Encounter: Payer: Self-pay | Admitting: Family Medicine

## 2020-02-03 ENCOUNTER — Other Ambulatory Visit: Payer: Self-pay

## 2020-02-03 VITALS — BP 122/60 | HR 86 | Temp 97.9°F | Ht 72.0 in | Wt 222.4 lb

## 2020-02-03 DIAGNOSIS — E785 Hyperlipidemia, unspecified: Secondary | ICD-10-CM | POA: Diagnosis not present

## 2020-02-03 DIAGNOSIS — I10 Essential (primary) hypertension: Secondary | ICD-10-CM

## 2020-02-03 DIAGNOSIS — B351 Tinea unguium: Secondary | ICD-10-CM | POA: Diagnosis not present

## 2020-02-03 LAB — COMPLETE METABOLIC PANEL WITH GFR
AG Ratio: 1.8 (calc) (ref 1.0–2.5)
ALT: 32 U/L (ref 9–46)
AST: 21 U/L (ref 10–35)
Albumin: 4.6 g/dL (ref 3.6–5.1)
Alkaline phosphatase (APISO): 64 U/L (ref 35–144)
BUN: 12 mg/dL (ref 7–25)
CO2: 30 mmol/L (ref 20–32)
Calcium: 9.8 mg/dL (ref 8.6–10.3)
Chloride: 101 mmol/L (ref 98–110)
Creat: 0.74 mg/dL (ref 0.70–1.33)
GFR, Est African American: 118 mL/min/{1.73_m2} (ref >=60)
GFR, Est Non African American: 102 mL/min/{1.73_m2} (ref >=60)
Globulin: 2.6 g/dL (calc) (ref 1.9–3.7)
Glucose, Bld: 100 mg/dL — ABNORMAL HIGH (ref 65–99)
Potassium: 4 mmol/L (ref 3.5–5.3)
Sodium: 137 mmol/L (ref 135–146)
Total Bilirubin: 0.6 mg/dL (ref 0.2–1.2)
Total Protein: 7.2 g/dL (ref 6.1–8.1)

## 2020-02-03 LAB — LIPID PANEL
Cholesterol: 199 mg/dL (ref <200)
HDL: 36 mg/dL — ABNORMAL LOW (ref >=40)
LDL Cholesterol (Calc): 132 mg/dL (calc) — ABNORMAL HIGH
Non-HDL Cholesterol (Calc): 163 mg/dL (calc) — ABNORMAL HIGH (ref <130)
Total CHOL/HDL Ratio: 5.5 (calc) — ABNORMAL HIGH (ref <5.0)
Triglycerides: 171 mg/dL — ABNORMAL HIGH (ref <150)

## 2020-02-03 MED ORDER — TERBINAFINE HCL 250 MG PO TABS: 250.0000 mg | ORAL_TABLET | Freq: Every day | ORAL | 0 refills | Status: DC

## 2020-02-03 MED ORDER — EZETIMIBE 10 MG PO TABS: 10.0000 mg | ORAL_TABLET | Freq: Every day | ORAL | 3 refills | Status: DC

## 2020-02-03 NOTE — Patient Instructions (Signed)
Great to see you today! Continue current medications.  Have labs completed.  See me again in about 6 months.

## 2020-02-03 NOTE — Assessment & Plan Note (Signed)
Updated lipid panel and LFT's Tolerating zetia well.

## 2020-02-03 NOTE — Progress Notes (Signed)
Bryce King - 59 y.o. male MRN [id number]  Date of birth: 29-Apr-1960  Subjective Chief Complaint  Patient presents with  . Hyperlipidemia    HPI Bryce King is a 59 y.o. male here today for follow up of HLD and HTN.  He has shoulder arthroplasty earlier this year, doing well from this.    He is tolerating zetia well for treatment of his HLD. He has not tolerated statins well historically. He needs refill and updated lipid panel.    BP remains well controlled with lisinopril/hctz.  No side effects related to medication. He has not had symptoms related to HTN including chest pain, shortness of breath, palpitations, headache or vision changes.   He continues to have some issues with dystrophic nails on hands and feet.  Some yellowing and brittleness.   ROS:  A comprehensive ROS was completed and negative except as noted per HPI   Allergies  Allergen Reactions  . Statins Other (See Comments)    Muscle aches    Past Medical History:  Diagnosis Date  . Allergy   . Arthritis   . Dizziness   . Heart palpitations   . Hyperlipidemia   . Hypertension   . OSA (obstructive sleep apnea)    Uses CPAP  . Sleep apnea    wears cpap     Past Surgical History:  Procedure Laterality Date  . ELBOW SURGERY Right 2005  . REVERSE SHOULDER ARTHROPLASTY Right 08/10/2019   Procedure: REVERSE SHOULDER ARTHROPLASTY;  Surgeon: Hiram Gash, MD;  Location: [facility];  Service: Orthopedics;  Laterality: Right;  . ROTATOR CUFF REPAIR Right 2007    Social History   Socioeconomic History  . Marital status: Married    Spouse name: Not on file  . Number of children: 1  . Years of education: Not on file  . Highest education level: Some college, no degree  Occupational History  . Not on file  Tobacco Use  . Smoking status: Current Every Day Smoker    Packs/day: 1.00    Types: Cigarettes  . Smokeless tobacco: Never Used  Vaping Use  . Vaping Use: Never used  Substance and Sexual  Activity  . Alcohol use: Yes    Comment: weekly, "07/21/18 2 drinks a week"   . Drug use: Never  . Sexual activity: Not on file  Other Topics Concern  . Not on file  Social History Narrative   Lives with wife   Caffeine- coffee 2 cups, tea all day   Social Determinants of Health   Financial Resource Strain:   . Difficulty of Paying Living Expenses: Not on file  Food Insecurity:   . Worried About Charity fundraiser in the Last Year: Not on file  . Ran Out of Food in the Last Year: Not on file  Transportation Needs:   . Lack of Transportation (Medical): Not on file  . Lack of Transportation (Non-Medical): Not on file  Physical Activity:   . Days of Exercise per Week: Not on file  . Minutes of Exercise per Session: Not on file  Stress:   . Feeling of Stress : Not on file  Social Connections:   . Frequency of Communication with Friends and Family: Not on file  . Frequency of Social Gatherings with Friends and Family: Not on file  . Attends Religious Services: Not on file  . Active Member of Clubs or Organizations: Not on file  . Attends Archivist Meetings: Not on file  .  Marital Status: Not on file    Family History  Problem Relation Age of Onset  . Cancer Father   . Depression Father   . Diabetes Father   . Prostate cancer Father   . COPD Maternal Grandmother   . Heart attack Maternal Grandfather   . Heart attack Paternal Grandfather   . Colon cancer Neg Hx   . Colon polyps Neg Hx   . Esophageal cancer Neg Hx   . Rectal cancer Neg Hx   . Stomach cancer Neg Hx     Health Maintenance  Topic Date Due  . Hepatitis C Screening  Never done  . TETANUS/TDAP  Never done  . COLONOSCOPY  01/24/2030  . INFLUENZA VACCINE  Completed  . COVID-19 Vaccine  Completed  . HIV Screening  Completed      ----------------------------------------------------------------------------------------------------------------------------------------------------------------------------------------------------------------- Physical Exam There were no vitals taken for this visit.  Physical Exam Constitutional:      Appearance: Normal appearance.  HENT:     Head: Normocephalic and atraumatic.  Eyes:     General: No scleral icterus. Cardiovascular:     Rate and Rhythm: Normal rate and regular rhythm.  Pulmonary:     Effort: Pulmonary effort is normal.     Breath sounds: Normal breath sounds.  Musculoskeletal:     Cervical back: Neck supple.  Skin:    Comments: Thickened, dystrophic and discolored nails on several fingers and toes.   Neurological:     General: No focal deficit present.     Mental Status: He is alert.  Psychiatric:        Mood and Affect: Mood normal.        Behavior: Behavior normal.     ------------------------------------------------------------------------------------------------------------------------------------------------------------------------------------------------------------------- Assessment and Plan  Onychomycosis Treated with terbinafine previously and had improvement, repeat course.   Essential hypertension Blood pressure is at goal at for age and co-morbidities.  I recommend continuation of lisinopril/hctz.  In addition they were instructed to follow a low sodium diet with regular exercise to help to maintain adequate control of blood pressure.    Hyperlipidemia Updated lipid panel and LFT's Tolerating zetia well.    Meds ordered this encounter  Medications  . ezetimibe (ZETIA) 10 MG tablet    Sig: Take 1 tablet (10 mg total) by mouth daily.    Dispense:  90 tablet    Refill:  3  . terbinafine (LAMISIL) 250 MG tablet    Sig: Take 1 tablet (250 mg total) by mouth daily.    Dispense:  84 tablet    Refill:  0   Orders Placed This  Encounter  Procedures  . COMPLETE METABOLIC PANEL WITH GFR  . Lipid Profile     Return in about 6 months (around 08/02/2020) for HTN.    This visit occurred during the SARS-CoV-2 public health emergency.  Safety protocols were in place, including screening questions prior to the visit, additional usage of staff PPE, and extensive cleaning of exam room while observing appropriate contact time as indicated for disinfecting solutions.

## 2020-02-03 NOTE — Assessment & Plan Note (Signed)
Treated with terbinafine previously and had improvement, repeat course.

## 2020-02-03 NOTE — Assessment & Plan Note (Signed)
Blood pressure is at goal at for age and co-morbidities.  I recommend continuation of lisinopril/hctz.  In addition they were instructed to follow a low sodium diet with regular exercise to help to maintain adequate control of blood pressure.

## 2020-02-06 ENCOUNTER — Encounter: Payer: Self-pay | Admitting: Family Medicine

## 2020-02-06 NOTE — Progress Notes (Signed)
Done

## 2020-02-07 ENCOUNTER — Other Ambulatory Visit: Payer: Self-pay | Admitting: Family Medicine

## 2020-02-07 MED ORDER — REPATHA SURECLICK 140 MG/ML ~~LOC~~ SOAJ: 140.0000 mg | SUBCUTANEOUS | 1 refills | Status: DC

## 2020-02-17 ENCOUNTER — Telehealth: Payer: Self-pay | Admitting: *Deleted

## 2020-02-17 NOTE — Telephone Encounter (Signed)
Repatha approved through Cover My Meds until 02/16/21. Pharmacy notified.

## 2020-03-07 ENCOUNTER — Encounter: Payer: Self-pay | Admitting: Family Medicine

## 2020-03-07 DIAGNOSIS — D485 Neoplasm of uncertain behavior of skin: Secondary | ICD-10-CM

## 2020-04-09 ENCOUNTER — Encounter: Payer: Self-pay | Admitting: Family Medicine

## 2020-04-10 ENCOUNTER — Other Ambulatory Visit: Payer: Self-pay | Admitting: Family Medicine

## 2020-04-10 MED ORDER — REPATHA SURECLICK 140 MG/ML ~~LOC~~ SOAJ: 140.0000 mg | SUBCUTANEOUS | 1 refills | Status: DC

## 2020-04-12 ENCOUNTER — Other Ambulatory Visit: Payer: Self-pay

## 2020-04-12 ENCOUNTER — Encounter (HOSPITAL_BASED_OUTPATIENT_CLINIC_OR_DEPARTMENT_OTHER): Payer: Self-pay | Admitting: Orthopaedic Surgery

## 2020-04-16 ENCOUNTER — Other Ambulatory Visit (HOSPITAL_COMMUNITY): Payer: Managed Care, Other (non HMO)

## 2020-04-17 ENCOUNTER — Other Ambulatory Visit: Payer: Self-pay

## 2020-04-17 ENCOUNTER — Other Ambulatory Visit (HOSPITAL_COMMUNITY)
Admission: RE | Admit: 2020-04-17 | Discharge: 2020-04-17 | Disposition: A | Discharge status: Home / Self Care | Payer: Managed Care, Other (non HMO) | Source: Ambulatory Visit | Attending: Orthopaedic Surgery | Admitting: Orthopaedic Surgery

## 2020-04-17 ENCOUNTER — Encounter (HOSPITAL_BASED_OUTPATIENT_CLINIC_OR_DEPARTMENT_OTHER)
Admission: RE | Admit: 2020-04-17 | Discharge: 2020-04-17 | Disposition: A | Discharge status: Home / Self Care | Payer: Managed Care, Other (non HMO) | Source: Ambulatory Visit | Attending: Orthopaedic Surgery | Admitting: Orthopaedic Surgery

## 2020-04-17 DIAGNOSIS — M19012 Primary osteoarthritis, left shoulder: Secondary | ICD-10-CM | POA: Diagnosis not present

## 2020-04-17 DIAGNOSIS — Z01818 Encounter for other preprocedural examination: Secondary | ICD-10-CM | POA: Insufficient documentation

## 2020-04-17 DIAGNOSIS — Z01812 Encounter for preprocedural laboratory examination: Secondary | ICD-10-CM | POA: Insufficient documentation

## 2020-04-17 DIAGNOSIS — M7522 Bicipital tendinitis, left shoulder: Secondary | ICD-10-CM | POA: Diagnosis not present

## 2020-04-17 DIAGNOSIS — G4733 Obstructive sleep apnea (adult) (pediatric): Secondary | ICD-10-CM | POA: Diagnosis not present

## 2020-04-17 DIAGNOSIS — Z20822 Contact with and (suspected) exposure to covid-19: Secondary | ICD-10-CM | POA: Insufficient documentation

## 2020-04-17 DIAGNOSIS — Z888 Allergy status to other drugs, medicaments and biological substances status: Secondary | ICD-10-CM | POA: Diagnosis not present

## 2020-04-17 DIAGNOSIS — F17210 Nicotine dependence, cigarettes, uncomplicated: Secondary | ICD-10-CM | POA: Diagnosis not present

## 2020-04-17 DIAGNOSIS — I10 Essential (primary) hypertension: Secondary | ICD-10-CM | POA: Diagnosis not present

## 2020-04-17 DIAGNOSIS — E785 Hyperlipidemia, unspecified: Secondary | ICD-10-CM | POA: Diagnosis not present

## 2020-04-17 DIAGNOSIS — M75122 Complete rotator cuff tear or rupture of left shoulder, not specified as traumatic: Secondary | ICD-10-CM | POA: Diagnosis not present

## 2020-04-17 DIAGNOSIS — Z79899 Other long term (current) drug therapy: Secondary | ICD-10-CM | POA: Diagnosis not present

## 2020-04-17 DIAGNOSIS — S43432A Superior glenoid labrum lesion of left shoulder, initial encounter: Secondary | ICD-10-CM | POA: Diagnosis not present

## 2020-04-17 DIAGNOSIS — M25812 Other specified joint disorders, left shoulder: Secondary | ICD-10-CM | POA: Diagnosis not present

## 2020-04-17 DIAGNOSIS — X58XXXA Exposure to other specified factors, initial encounter: Secondary | ICD-10-CM | POA: Diagnosis not present

## 2020-04-17 LAB — BASIC METABOLIC PANEL
Anion gap: 9 (ref 5–15)
BUN: 12 mg/dL (ref 6–20)
CO2: 26 mmol/L (ref 22–32)
Calcium: 9.2 mg/dL (ref 8.9–10.3)
Chloride: 103 mmol/L (ref 98–111)
Creatinine, Ser: 0.82 mg/dL (ref 0.61–1.24)
GFR, Estimated: >60 mL/min (ref >60)
Glucose, Bld: 79 mg/dL (ref 70–99)
Potassium: 4.4 mmol/L (ref 3.5–5.1)
Sodium: 138 mmol/L (ref 135–145)

## 2020-04-17 LAB — SARS CORONAVIRUS 2 (TAT 6-24 HRS): SARS Coronavirus 2: NEGATIVE

## 2020-04-17 MED ORDER — LISINOPRIL-HYDROCHLOROTHIAZIDE 20-12.5 MG PO TABS: 1.0000 | ORAL_TABLET | Freq: Every day | ORAL | 1 refills | Status: DC

## 2020-04-17 NOTE — Progress Notes (Signed)

## 2020-04-18 NOTE — Anesthesia Preprocedure Evaluation (Signed)
Anesthesia Evaluation  Patient identified by MRN, date of birth, ID band Patient awake    Reviewed: Allergy & Precautions, NPO status , Patient's Chart, lab work & pertinent test results  History of Anesthesia Complications Negative for: history of anesthetic complications  Airway Mallampati: II  TM Distance: >3 FB Neck ROM: Full    Dental no notable dental hx. (+) Dental Advisory Given   Pulmonary sleep apnea and Continuous Positive Airway Pressure Ventilation , Current Smoker and Patient abstained from smoking.,    Pulmonary exam normal        Cardiovascular hypertension, Pt. on medications and Pt. on home beta blockers Normal cardiovascular exam     Neuro/Psych negative neurological ROS     GI/Hepatic negative GI ROS, Neg liver ROS,   Endo/Other  negative endocrine ROS  Renal/GU negative Renal ROS     Musculoskeletal  (+) Arthritis ,   Abdominal   Peds  Hematology negative hematology ROS (+)   Anesthesia Other Findings   Reproductive/Obstetrics                            Lab Results  Component Value Date   WBC 5.0 08/05/2019   HGB 15.5 08/05/2019   HCT 46.3 08/05/2019   MCV 97.9 08/05/2019   PLT 198 08/05/2019   Lab Results  Component Value Date   CREATININE 0.82 04/17/2020   BUN 12 04/17/2020   NA 138 04/17/2020   K 4.4 04/17/2020   CL 103 04/17/2020   CO2 26 04/17/2020   EKG: 08/05/2019 Rate 85 bpm  Normal sinus rhythm   CV: Exercise Tolerance Test 05/25/2018  Blood pressure demonstrated a normal response to exercise.  There was no ST segment deviation noted during stress.  ETT with good exercise tolerance (9:00); no chest pain; normal blood pressure response; no ST changes; negative adequate exercise tolerance test; Duke treadmill score 9.  Echo 05/18/2018 IMPRESSIONS    1. The left ventricle has normal systolic function, with an ejection  fraction of  55-60%. The cavity size was normal. Left ventricular diastolic  parameters were normal No evidence of left ventricular regional wall  motion abnormalities.  2. The right ventricle has normal systolic function. The cavity was  normal. There is no increase in right ventricular wall thickness. Right  ventricular systolic pressure could not be assessed.  3. The mitral valve is normal in structure.  4. The tricuspid valve is normal in structure.  5. The aortic valve is tricuspid.  6. No evidence of left ventricular regional wall motion abnormalities.  7. Right atrial pressure is estimated at 3 mmHg.  Anesthesia Physical  Anesthesia Plan  ASA: II  Anesthesia Plan: General   Post-op Pain Management:  Regional for Post-op pain   Induction: Intravenous  PONV Risk Score and Plan: 1 and Dexamethasone, Ondansetron and Treatment may vary due to age or medical condition  Airway Management Planned: Oral ETT  Additional Equipment: None  Intra-op Plan:   Post-operative Plan: Extubation in OR  Informed Consent: I have reviewed the patients History and Physical, chart, labs and discussed the procedure including the risks, benefits and alternatives for the proposed anesthesia with the patient or authorized representative who has indicated his/her understanding and acceptance.     Dental advisory given  Plan Discussed with: CRNA and Anesthesiologist  Anesthesia Plan Comments: (Per cardiology 07/07/2019 telephone encounter, "Chart reviewed as part of pre-operative protocol coverage. Patient was contacted 07/07/2019 in reference to  pre-operative risk assessment for pending surgery as outlined below.  Bryce King was last seen by Bryce King in 08/2018. He was seen for palpitations that have since resolved. He underwent an ETT that was negative for signs of ischemia. He has never had a MI or stroke. Since his last visit, Bryce King has done well. He can complete more than  4.0 METS without anginal symptoms. No medications need to be held for surgery. Therefore, based on ACC/AHA guidelines, the patient would be at acceptable risk for the planned procedure without further cardiovascular testing.")       Anesthesia Quick Evaluation

## 2020-04-19 ENCOUNTER — Ambulatory Visit (HOSPITAL_BASED_OUTPATIENT_CLINIC_OR_DEPARTMENT_OTHER): Payer: Managed Care, Other (non HMO) | Admitting: Certified Registered"

## 2020-04-19 ENCOUNTER — Encounter (HOSPITAL_BASED_OUTPATIENT_CLINIC_OR_DEPARTMENT_OTHER): Payer: Self-pay | Admitting: Orthopaedic Surgery

## 2020-04-19 ENCOUNTER — Other Ambulatory Visit: Payer: Self-pay

## 2020-04-19 ENCOUNTER — Encounter (HOSPITAL_BASED_OUTPATIENT_CLINIC_OR_DEPARTMENT_OTHER)
Admission: RE | Disposition: A | Discharge status: Home / Self Care | Payer: Self-pay | Source: Home / Self Care | Attending: Orthopaedic Surgery

## 2020-04-19 ENCOUNTER — Ambulatory Visit (HOSPITAL_BASED_OUTPATIENT_CLINIC_OR_DEPARTMENT_OTHER)
Admission: RE | Admit: 2020-04-19 | Discharge: 2020-04-19 | Disposition: A | Discharge status: Home / Self Care | Payer: Managed Care, Other (non HMO) | Attending: Orthopaedic Surgery | Admitting: Orthopaedic Surgery

## 2020-04-19 DIAGNOSIS — Z79899 Other long term (current) drug therapy: Secondary | ICD-10-CM | POA: Insufficient documentation

## 2020-04-19 DIAGNOSIS — E785 Hyperlipidemia, unspecified: Secondary | ICD-10-CM | POA: Insufficient documentation

## 2020-04-19 DIAGNOSIS — Z888 Allergy status to other drugs, medicaments and biological substances status: Secondary | ICD-10-CM | POA: Insufficient documentation

## 2020-04-19 DIAGNOSIS — M25812 Other specified joint disorders, left shoulder: Secondary | ICD-10-CM | POA: Insufficient documentation

## 2020-04-19 DIAGNOSIS — M75122 Complete rotator cuff tear or rupture of left shoulder, not specified as traumatic: Secondary | ICD-10-CM | POA: Insufficient documentation

## 2020-04-19 DIAGNOSIS — G4733 Obstructive sleep apnea (adult) (pediatric): Secondary | ICD-10-CM | POA: Insufficient documentation

## 2020-04-19 DIAGNOSIS — M7522 Bicipital tendinitis, left shoulder: Secondary | ICD-10-CM | POA: Insufficient documentation

## 2020-04-19 DIAGNOSIS — M19012 Primary osteoarthritis, left shoulder: Secondary | ICD-10-CM | POA: Insufficient documentation

## 2020-04-19 DIAGNOSIS — I10 Essential (primary) hypertension: Secondary | ICD-10-CM | POA: Insufficient documentation

## 2020-04-19 DIAGNOSIS — S43432A Superior glenoid labrum lesion of left shoulder, initial encounter: Secondary | ICD-10-CM | POA: Insufficient documentation

## 2020-04-19 DIAGNOSIS — F17210 Nicotine dependence, cigarettes, uncomplicated: Secondary | ICD-10-CM | POA: Insufficient documentation

## 2020-04-19 DIAGNOSIS — X58XXXA Exposure to other specified factors, initial encounter: Secondary | ICD-10-CM | POA: Insufficient documentation

## 2020-04-19 HISTORY — PX: SHOULDER ARTHROSCOPY WITH ROTATOR CUFF REPAIR AND SUBACROMIAL DECOMPRESSION: SHX5686

## 2020-04-19 HISTORY — PX: BICEPT TENODESIS: SHX5116

## 2020-04-19 SURGERY — SHOULDER ARTHROSCOPY WITH ROTATOR CUFF REPAIR AND SUBACROMIAL DECOMPRESSION
Anesthesia: General | Site: Shoulder | Laterality: Left

## 2020-04-19 MED ORDER — BUPIVACAINE LIPOSOME 1.3 % IJ SUSP
INTRAMUSCULAR | Status: DC | PRN
  Administered 2020-04-19: 10 mL via PERINEURAL

## 2020-04-19 MED ORDER — LISINOPRIL-HYDROCHLOROTHIAZIDE 20-12.5 MG PO TABS: 1.0000 | ORAL_TABLET | Freq: Every day | ORAL | 1 refills | Status: DC

## 2020-04-19 MED ORDER — CELECOXIB 200 MG PO CAPS: 200.0000 mg | ORAL_CAPSULE | Freq: Two times a day (BID) | ORAL | 0 refills | Status: AC

## 2020-04-19 MED ORDER — ACETAMINOPHEN 500 MG PO TABS
ORAL_TABLET | ORAL | Status: AC
  Filled 2020-04-19: qty 2

## 2020-04-19 MED ORDER — ONDANSETRON HCL 4 MG PO TABS: 4.0000 mg | ORAL_TABLET | Freq: Three times a day (TID) | ORAL | 1 refills | Status: AC | PRN

## 2020-04-19 MED ORDER — CELECOXIB 200 MG PO CAPS
ORAL_CAPSULE | ORAL | Status: AC
  Filled 2020-04-19: qty 1

## 2020-04-19 MED ORDER — MIDAZOLAM HCL 2 MG/2ML IJ SOLN
2.0000 mg | Freq: Once | INTRAMUSCULAR | Status: AC
  Administered 2020-04-19: 2 mg via INTRAVENOUS

## 2020-04-19 MED ORDER — ACETAMINOPHEN 500 MG PO TABS: 1000.0000 mg | ORAL_TABLET | Freq: Three times a day (TID) | ORAL | 0 refills | Status: AC

## 2020-04-19 MED ORDER — CEFAZOLIN SODIUM-DEXTROSE 2-4 GM/100ML-% IV SOLN
2.0000 g | INTRAVENOUS | Status: AC
  Administered 2020-04-19: 2 g via INTRAVENOUS

## 2020-04-19 MED ORDER — ONDANSETRON HCL 4 MG/2ML IJ SOLN
INTRAMUSCULAR | Status: DC | PRN
Start: 2020-04-19 — End: 2020-04-19
  Administered 2020-04-19: 4 mg via INTRAVENOUS

## 2020-04-19 MED ORDER — OXYCODONE HCL 5 MG PO TABS: ORAL_TABLET | ORAL | 0 refills | Status: AC

## 2020-04-19 MED ORDER — FENTANYL CITRATE (PF) 100 MCG/2ML IJ SOLN
INTRAMUSCULAR | Status: AC
  Filled 2020-04-19: qty 2

## 2020-04-19 MED ORDER — PROPOFOL 10 MG/ML IV BOLUS
INTRAVENOUS | Status: DC | PRN
  Administered 2020-04-19: 180 mg via INTRAVENOUS

## 2020-04-19 MED ORDER — LIDOCAINE 2% (20 MG/ML) 5 ML SYRINGE
INTRAMUSCULAR | Status: AC
  Filled 2020-04-19: qty 5

## 2020-04-19 MED ORDER — OXYCODONE HCL 5 MG PO TABS
ORAL_TABLET | ORAL | Status: AC
  Filled 2020-04-19: qty 1

## 2020-04-19 MED ORDER — AMISULPRIDE (ANTIEMETIC) 5 MG/2ML IV SOLN: 10.0000 mg | Freq: Once | INTRAVENOUS | Status: DC | PRN

## 2020-04-19 MED ORDER — CELECOXIB 200 MG PO CAPS
200.0000 mg | ORAL_CAPSULE | Freq: Once | ORAL | Status: AC
  Administered 2020-04-19: 200 mg via ORAL

## 2020-04-19 MED ORDER — ONDANSETRON HCL 4 MG/2ML IJ SOLN
INTRAMUSCULAR | Status: AC
  Filled 2020-04-19: qty 2

## 2020-04-19 MED ORDER — FENTANYL CITRATE (PF) 100 MCG/2ML IJ SOLN
100.0000 ug | Freq: Once | INTRAMUSCULAR | Status: AC
  Administered 2020-04-19: 100 ug via INTRAVENOUS

## 2020-04-19 MED ORDER — DEXAMETHASONE SODIUM PHOSPHATE 10 MG/ML IJ SOLN
INTRAMUSCULAR | Status: AC
  Filled 2020-04-19: qty 1

## 2020-04-19 MED ORDER — PHENYLEPHRINE 40 MCG/ML (10ML) SYRINGE FOR IV PUSH (FOR BLOOD PRESSURE SUPPORT)
PREFILLED_SYRINGE | INTRAVENOUS | Status: AC
  Filled 2020-04-19: qty 10

## 2020-04-19 MED ORDER — PHENYLEPHRINE HCL (PRESSORS) 10 MG/ML IV SOLN
INTRAVENOUS | Status: DC | PRN
  Administered 2020-04-19 (×7): 40 ug via INTRAVENOUS
  Administered 2020-04-19: 80 ug via INTRAVENOUS

## 2020-04-19 MED ORDER — BUPIVACAINE LIPOSOME 1.3 % IJ SUSP: INTRAMUSCULAR | Status: DC | PRN

## 2020-04-19 MED ORDER — FENTANYL CITRATE (PF) 100 MCG/2ML IJ SOLN
INTRAMUSCULAR | Status: DC | PRN
  Administered 2020-04-19: 50 ug via INTRAVENOUS

## 2020-04-19 MED ORDER — CEFAZOLIN SODIUM-DEXTROSE 2-4 GM/100ML-% IV SOLN
INTRAVENOUS | Status: AC
  Filled 2020-04-19: qty 100

## 2020-04-19 MED ORDER — SUGAMMADEX SODIUM 500 MG/5ML IV SOLN
INTRAVENOUS | Status: DC | PRN
  Administered 2020-04-19: 250 mg via INTRAVENOUS

## 2020-04-19 MED ORDER — OXYCODONE HCL 5 MG PO TABS
5.0000 mg | ORAL_TABLET | Freq: Once | ORAL | Status: AC
Start: 2020-04-19 — End: 2020-04-19
  Administered 2020-04-19: 5 mg via ORAL

## 2020-04-19 MED ORDER — DEXAMETHASONE SODIUM PHOSPHATE 4 MG/ML IJ SOLN
INTRAMUSCULAR | Status: DC | PRN
  Administered 2020-04-19: 10 mg via INTRAVENOUS

## 2020-04-19 MED ORDER — PROPOFOL 10 MG/ML IV BOLUS
INTRAVENOUS | Status: AC
  Filled 2020-04-19: qty 40

## 2020-04-19 MED ORDER — MIDAZOLAM HCL 2 MG/2ML IJ SOLN
INTRAMUSCULAR | Status: AC
  Filled 2020-04-19: qty 2

## 2020-04-19 MED ORDER — SODIUM CHLORIDE 0.9 % IR SOLN
Status: DC | PRN
  Administered 2020-04-19: 10000 mL

## 2020-04-19 MED ORDER — ROCURONIUM BROMIDE 100 MG/10ML IV SOLN
INTRAVENOUS | Status: DC | PRN
  Administered 2020-04-19: 100 mg via INTRAVENOUS

## 2020-04-19 MED ORDER — ACETAMINOPHEN 500 MG PO TABS
1000.0000 mg | ORAL_TABLET | Freq: Once | ORAL | Status: AC
  Administered 2020-04-19: 1000 mg via ORAL

## 2020-04-19 MED ORDER — BUPIVACAINE HCL (PF) 0.5 % IJ SOLN
INTRAMUSCULAR | Status: DC | PRN
  Administered 2020-04-19: 15 mL via PERINEURAL

## 2020-04-19 MED ORDER — FENTANYL CITRATE (PF) 100 MCG/2ML IJ SOLN: 25.0000 ug | INTRAMUSCULAR | Status: DC | PRN

## 2020-04-19 MED ORDER — ROCURONIUM BROMIDE 10 MG/ML (PF) SYRINGE
PREFILLED_SYRINGE | INTRAVENOUS | Status: AC
  Filled 2020-04-19: qty 10

## 2020-04-19 MED ORDER — LACTATED RINGERS IV SOLN: INTRAVENOUS | Status: DC

## 2020-04-19 SURGICAL SUPPLY — 64 items
AID PSTN UNV HD RSTRNT DISP (MISCELLANEOUS) ×2
ANCHOR SUT 1.8 FBRTK KNTLS 2SU (Anchor) ×6 IMPLANT
APL PRP STRL LF DISP 70% ISPRP (MISCELLANEOUS) ×2
BLADE EXCALIBUR 4.0X13 (MISCELLANEOUS) ×3 IMPLANT
BLADE SURG 10 STRL SS (BLADE) IMPLANT
BURR OVAL 8 FLU 4.0X13 (MISCELLANEOUS) ×3 IMPLANT
CANNULA 5.75X71 LONG (CANNULA) IMPLANT
CANNULA PASSPORT 5 (CANNULA) IMPLANT
CANNULA PASSPORT BUTTON 10-40 (CANNULA) ×3 IMPLANT
CANNULA TWIST IN 8.25X7CM (CANNULA) IMPLANT
CHLORAPREP W/TINT 26 (MISCELLANEOUS) ×3 IMPLANT
CLSR STERI-STRIP ANTIMIC 1/2X4 (GAUZE/BANDAGES/DRESSINGS) ×3 IMPLANT
COOLER ICEMAN CLASSIC (MISCELLANEOUS) ×3 IMPLANT
COVER WAND RF STERILE (DRAPES) IMPLANT
DRAPE IMP U-DRAPE 54X76 (DRAPES) ×3 IMPLANT
DRAPE INCISE IOBAN 66X45 STRL (DRAPES) IMPLANT
DRAPE SHOULDER BEACH CHAIR (DRAPES) ×3 IMPLANT
DRSG PAD ABDOMINAL 8X10 ST (GAUZE/BANDAGES/DRESSINGS) ×3 IMPLANT
DW OUTFLOW CASSETTE/TUBE SET (MISCELLANEOUS) ×3 IMPLANT
GAUZE SPONGE 4X4 12PLY STRL (GAUZE/BANDAGES/DRESSINGS) ×3 IMPLANT
GLOVE BIOGEL M 7.0 STRL (GLOVE) ×6 IMPLANT
GLOVE ECLIPSE 6.5 STRL STRAW (GLOVE) ×3 IMPLANT
GLOVE ECLIPSE 8.0 STRL XLNG CF (GLOVE) ×3 IMPLANT
GLOVE SRG 8 PF TXTR STRL LF DI (GLOVE) ×2 IMPLANT
GLOVE SURG ENC MOIS LTX SZ6 (GLOVE) ×3 IMPLANT
GLOVE SURG ENC MOIS LTX SZ6.5 (GLOVE) IMPLANT
GLOVE SURG UNDER POLY LF SZ6.5 (GLOVE) ×3 IMPLANT
GLOVE SURG UNDER POLY LF SZ8 (GLOVE) ×3
GOWN STRL REUS W/ TWL LRG LVL3 (GOWN DISPOSABLE) ×4 IMPLANT
GOWN STRL REUS W/TWL LRG LVL3 (GOWN DISPOSABLE) ×6
GOWN STRL REUS W/TWL XL LVL3 (GOWN DISPOSABLE) ×3 IMPLANT
IMPL SPEEDBRIDGE KIT (Orthopedic Implant) ×2 IMPLANT
IMPLANT SPEEDBRIDGE KIT (Orthopedic Implant) ×3 IMPLANT
IV NS IRRIG 3000ML ARTHROMATIC (IV SOLUTION) ×12 IMPLANT
KIT POSITIONER SHLDR ASSISTARM (MISCELLANEOUS) ×3 IMPLANT
KIT STABILIZATION SHOULDER (MISCELLANEOUS) IMPLANT
KIT STR SPEAR 1.8 FBRTK DISP (KITS) ×3 IMPLANT
LASSO CRESCENT QUICKPASS (SUTURE) ×3 IMPLANT
MANIFOLD NEPTUNE II (INSTRUMENTS) ×3 IMPLANT
NDL SAFETY ECLIPSE 18X1.5 (NEEDLE) IMPLANT
NEEDLE HYPO 18GX1.5 SHARP (NEEDLE)
NEEDLE SCORPION MULTI FIRE (NEEDLE) IMPLANT
PACK ARTHROSCOPY DSU (CUSTOM PROCEDURE TRAY) ×3 IMPLANT
PACK BASIN DAY SURGERY FS (CUSTOM PROCEDURE TRAY) ×3 IMPLANT
PAD COLD SHLDR SM WRAP-ON (PAD) ×3 IMPLANT
PAD COLD SHLDR WRAP-ON (PAD) IMPLANT
PORT APPOLLO RF 90DEGREE MULTI (SURGICAL WAND) ×3 IMPLANT
RESTRAINT HEAD UNIVERSAL NS (MISCELLANEOUS) ×3 IMPLANT
SHEET MEDIUM DRAPE 40X70 STRL (DRAPES) IMPLANT
SLEEVE SCD COMPRESS KNEE MED (MISCELLANEOUS) ×3 IMPLANT
SLING ARM FOAM STRAP LRG (SOFTGOODS) IMPLANT
SUT FIBERWIRE #2 38 T-5 BLUE (SUTURE)
SUT MNCRL AB 4-0 PS2 18 (SUTURE) ×3 IMPLANT
SUT PDS AB 1 CT  36 (SUTURE)
SUT PDS AB 1 CT 36 (SUTURE) IMPLANT
SUT TIGER TAPE 7 IN WHITE (SUTURE) IMPLANT
SUTURE FIBERWR #2 38 T-5 BLUE (SUTURE) IMPLANT
SUTURE TAPE TIGERLINK 1.3MM BL (SUTURE) IMPLANT
SUTURETAPE TIGERLINK 1.3MM BL (SUTURE)
SYR 5ML LL (SYRINGE) IMPLANT
TAPE FIBER 2MM 7IN #2 BLUE (SUTURE) IMPLANT
TOWEL GREEN STERILE FF (TOWEL DISPOSABLE) ×6 IMPLANT
TUBE CONNECTING 20X1/4 (TUBING) ×3 IMPLANT
TUBING ARTHROSCOPY IRRIG 16FT (MISCELLANEOUS) ×3 IMPLANT

## 2020-04-19 NOTE — Op Note (Signed)
Orthopaedic Surgery Operative Note (CSN: [id number])  Bryce King  1960/05/09 Date of Surgery: 04/19/2020   Diagnoses:  Left shoulder rotator cuff tear, SLAP tear, AC arthrosis and impingement  Procedure: Arthroscopic extensive debridement Arthroscopic subacromial decompression Arthroscopic rotator cuff repair Arthroscopic biceps tenodesis Arthroscopic distal clavicle excision   Operative Finding Exam under anesthesia: Full motion no limitations no instability Articular space: No loose bodies, capsule intact, anterior-inferior labral fraying as well as some posterior labral fraying all debris to back Chondral surfaces:Intact, no sign of chondral degeneration on the glenoid or humeral head Biceps: Type II SLAP tear with significant redness of the biceps itself Subscapularis: Normal Superior Cuff: Full-thickness supraspinatus tear and leading edge infraspinatus tear Bursal side: As above, moderate tissue quality  Successful completion of the planned procedure.  Patient's tissue quality was moderate but his repair was robust.  We were able to get good fixation overall.   Post-operative plan: The patient will be non-weightbearing in a sling for 6 weeks with therapy to start about a week after surgery.  The patient will be discharged home.  DVT prophylaxis not indicated in ambulatory upper extremity patient without known risk factors.   Pain control with PRN pain medication preferring oral medicines.  Follow up plan will be scheduled in approximately 7 days for incision check and XR.  Post-Op Diagnosis: Same Surgeons:Primary: Hiram Gash, MD Assistants:Caroline McBane PA-C Location: [facility] ROOM 6 Anesthesia: General with Exparel interscalene block Antibiotics: Ancef 2 g Tourniquet time: None Estimated Blood Loss: Minimal Complications: None Specimens: None Implants: Implant Name Type Inv. Item Serial No. Manufacturer Lot No. LRB No. Used Action  IMPLANT SPEEDBRIDGE KIT -  [id number] Orthopedic Implant IMPLANT SPEEDBRIDGE KIT  [employer] [id number] Left 1 Implanted  ANCHOR SUT 1.8 FIBERTAK 2 SUT - [id number] Anchor ANCHOR SUT 1.8 FIBERTAK 2 SUT  [employer] [id number] Left 1 Implanted  ANCHOR SUT 1.8 FIBERTAK 2 SUT - [id number] Anchor ANCHOR SUT 1.8 FIBERTAK 2 SUT  [employer] [id number] Left 1 Implanted    Indications for Surgery:   Bryce King is a 60 y.o. male with continued shoulder pain refractory to nonoperative measures for extended period of time.  MRI demonstrated to my full-thickness rotator cuff tear the patient desired to proceed with surgery as he failed nonoperative measures.  The risks and benefits were explained at length including but not limited to continued pain, cuff failure, biceps tenodesis failure, stiffness, need for further surgery and infection.   Procedure:   Patient was correctly identified in the preoperative holding area and operative site marked.  Patient brought to OR and positioned beachchair on an [place] table ensuring that all bony prominences were padded and the head was in an appropriate location.  Anesthesia was induced and the operative shoulder was prepped and draped in the usual sterile fashion.  Timeout was called preincision.  A standard posterior viewing portal was made after localizing the portal with a spinal needle.  An anterior accessory portal was also made.  After clearing the articular space the camera was positioned in the subacromial space.  Findings above.    Extensive debridement was performed of the anterior interval tissue, labral fraying and the bursa.  Subacromial decompression: We made a lateral portal with spinal needle guidance. We then proceeded to debride bursal tissue extensively with a shaver and arthrocare device. At that point we continued to identify the borders of the acromion and identify the spur. We then carefully preserved the deltoid fascia and used a  burr to convert the acromion to a Type 1 flat  acromion without issue.  Arthroscopic Rotator Cuff Repair: Tuberosity was prepared with a burr to a bleeding bed.  Following completion of the above we placed 2 4.7 Swivelock anchor loaded with a tape at inserted at the medial articular margin and an scorpion suture passing device, shuttled  sutures medially in a horizontal mattress suture configuration.  We then tied using arthroscopic knot tying techniques  each suture to its partner reducing the tendon at the prepared insertion site.  The fiber tape was not tied. With a medial row suture limbs then incorporated, 2 anteriorly and  2 posteriorly, into each of two 4.75 PEEK SwiveLock anchors, each placed 8 to 10 mm below the tip of the tuberosity and spanning anterior-posterior width of the tear with care to avoid over tensioning.   Biceps tenodesis: We marked the tendon and then performed a tenotomy and debridement of the stump in the articular space. We then identified the biceps tendon in its groove suprapec with the arthroscope in the lateral portal taking care to move from lateral to medial to avoid injury to the subscapularis. At that point we unroofed the tendon itself and mobilized it. An accessory anterior portal was made in line with the tendon and we grasped it from the anterior superior portal and worked from the accessory anterior portal. Two Fibertak 1.54m knotless anchors were placed in the groove and the tendon was secured in a luggage loop style fashion with a pass of the limb of suture through the tendon using a scorpion device to avoid pull-through.  Repair was completed with good tension on the tendon.  Residual stump of the tendon was removed after being resected with a RF ablator.  Distal Clavicle resection:  The scope was placed in the subacromial space from the posterior portal.  A hemostat was placed through the anterior portal and we spread at the AFort [facility] Surgery Center[facility].  A burr was then inserted and 10 mm of distal clavicle was resected taking  care to avoid damage to the capsule around the joint and avoiding overhanging bone posteriorly.    The incisions were closed with absorbable monocryl and steri strips.  A sterile dressing was placed along with a sling. The patient was awoken from general anesthesia and taken to the PACU in stable condition without complication.   CNoemi Chapel PA-C, present and scrubbed throughout the case, critical for completion in a timely fashion, and for retraction, instrumentation, closure.

## 2020-04-19 NOTE — Discharge Instructions (Signed)
Next dose of Tylenol can be given after 2:00PM Next dose of NSAID (Ibuprofen, Aleve, Motrin) can be given after 2:00PM.    Regional Anesthesia Blocks  1. Numbness or the inability to move the "blocked" extremity may last from 3-48 hours after placement. The length of time depends on the medication injected and your individual response to the medication. If the numbness is not going away after 48 hours, call your surgeon.  2. The extremity that is blocked will need to be protected until the numbness is gone and the  Strength has returned. Because you cannot feel it, you will need to take extra care to avoid injury. Because it may be weak, you may have difficulty moving it or using it. You may not know what position it is in without looking at it while the block is in effect.  3. For blocks in the legs and feet, returning to weight bearing and walking needs to be done carefully. You will need to wait until the numbness is entirely gone and the strength has returned. You should be able to move your leg and foot normally before you try and bear weight or walk. You will need someone to be with you when you first try to ensure you do not fall and possibly risk injury.  4. Bruising and tenderness at the needle site are common side effects and will resolve in a few days.  5. Persistent numbness or new problems with movement should be communicated to the surgeon or the [facility] [phone] [place] [phone]).     Information for Discharge Teaching: EXPAREL (bupivacaine liposome injectable suspension)   Your surgeon or anesthesiologist gave you EXPAREL(bupivacaine) to help control your pain after surgery.   EXPAREL is a local anesthetic that provides pain relief by numbing the tissue around the surgical site.  EXPAREL is designed to release pain medication over time and can control pain for up to 72 hours.  Depending on how you respond to EXPAREL, you may  require less pain medication during your recovery.  Possible side effects:  Temporary loss of sensation or ability to move in the area where bupivacaine was injected.  Nausea, vomiting, constipation  Rarely, numbness and tingling in your mouth or lips, lightheadedness, or anxiety may occur.  Call your doctor right away if you think you may be experiencing any of these sensations, or if you have other questions regarding possible side effects.  Follow all other discharge instructions given to you by your surgeon or nurse. Eat a healthy diet and drink plenty of water or other fluids.  If you return to the hospital for any reason within 96 hours following the administration of EXPAREL, it is important for health care providers to know that you have received this anesthetic. A teal colored band has been placed on your arm with the date, time and amount of EXPAREL you have received in order to alert and inform your health care providers. Please leave this armband in place for the full 96 hours following administration, and then you may remove the band.    Post Anesthesia Home Care Instructions  Activity: Get plenty of rest for the remainder of the day. A responsible individual must stay with you for 24 hours following the procedure.  For the next 24 hours, DO NOT: -Drive a car -Paediatric nurse -Drink alcoholic beverages -Take any medication unless instructed by your physician -Make any legal decisions or sign important papers.  Meals: Start with liquid  foods such as gelatin or soup. Progress to regular foods as tolerated. Avoid greasy, spicy, heavy foods. If nausea and/or vomiting occur, drink only clear liquids until the nausea and/or vomiting subsides. Call your physician if vomiting continues.  Special Instructions/Symptoms: Your throat may feel dry or sore from the anesthesia or the breathing tube placed in your throat during surgery. If this causes discomfort, gargle with warm salt  water. The discomfort should disappear within 24 hours.  If you had a scopolamine patch placed behind your ear for the management of post- operative nausea and/or vomiting:  1. The medication in the patch is effective for 72 hours, after which it should be removed.  Wrap patch in a tissue and discard in the trash. Wash hands thoroughly with soap and water. 2. You may remove the patch earlier than 72 hours if you experience unpleasant side effects which may include dry mouth, dizziness or visual disturbances. 3. Avoid touching the patch. Wash your hands with soap and water after contact with the patch.        Ophelia Charter MD, MPH Noemi Chapel, PA-C [place] [address] [phone] (tel)   [phone] (fax)   POST-OPERATIVE INSTRUCTIONS - SHOULDER ARTHROSCOPY  WOUND CARE  You may remove the Operative Dressing on Post-Op Day #3 (72hrs after surgery).    Alternatively if you would like you can leave dressing on until follow-up if within 7-8 days but keep it dry.  Leave steri-strips in place until they fall off on their own, usually 2 weeks postop.  There may be a small amount of fluid/bleeding leaking at the surgical site.  o This is normal; the shoulder is filled with fluid during the procedure and can leak for 24-48hrs after surgery.   You may change/reinforce the bandage as needed.   Use the Cryocuff or Ice as often as possible for the first 7 days, then as needed for pain relief. Always keep a towel, ACE wrap or other barrier between the cooling unit and your skin.   You may shower on Post-Op Day #3. Gently pat the area dry. Do not soak the shoulder in water or submerge it. Keep dry incisions as dry as possible.  Do not go swimming in the pool or ocean until 4 weeks after surgery or when otherwise instructed.    EXERCISES/BRACING ? Sling should be used at all times until follow-up. (including when you sleep!) ? You can remove sling  for hygiene.     Please continue to ambulate and do not stay sitting or lying for too long. Perform foot and wrist pumps to assist in circulation.  POST-OP MEDICATIONS- Multimodal approach to pain control  In general your pain will be controlled with a combination of substances.  Prescriptions unless otherwise discussed are electronically sent to your pharmacy.  This is a carefully made plan we use to minimize narcotic use.     ? Celebrex - Anti-inflammatory medication taken on a scheduled basis ? Acetaminophen - Non-narcotic pain medicine taken on a scheduled basis  ? Oxycodone - This is a strong narcotic, to be used only on an as needed basis for pain. ? Zofran - take as needed for nausea  FOLLOW-UP  If you develop a Fever (?101.5), Redness or Drainage from the surgical incision site, please call our office to arrange for an evaluation.  Please call the office to schedule a follow-up appointment for your suture removal, 10-14 days post-operatively.    HELPFUL INFORMATION  If you had a block, it will wear off between 8-24 hrs postop typically.  This is period when your pain may go from nearly zero to the pain you would have had postop without the block.  This is an abrupt transition but nothing dangerous is happening.  You may take an extra dose of narcotic when this happens.   You may be more comfortable sleeping in a semi-seated position the first few nights following surgery.  Keep a pillow propped under the elbow and forearm for comfort.  If you have a recliner type of chair it might be beneficial.  If not that is fine too, but it would be helpful to sleep propped up with pillows behind your operated shoulder as well under your elbow and forearm.  This will reduce pulling on the suture lines.   When dressing, put your operative arm in the sleeve first.  When getting undressed, take your operative arm out last.  Loose fitting, button-down shirts are recommended.  Often in the  first days after surgery you may be more comfortable keeping your operative arm under your shirt and not through the sleeve.   You may return to work/school in the next couple of days when you feel up to it.  Desk work and typing in the sling is fine.   We suggest you use the pain medication the first night prior to going to bed, in order to ease any pain when the anesthesia wears off. You should avoid taking pain medications on an empty stomach as it will make you nauseous.   You should wean off your narcotic medicines as soon as you are able.  Most patients will be off or using minimal narcotics before their first postop appointment.    Do not drink alcoholic beverages or take illicit drugs when taking pain medications.   It is against the law to drive while taking narcotics.  In some states it is against the law to drive while your arm is in a sling.    Pain medication may make you constipated.  Below are a few solutions to try in this order: - Decrease the amount of pain medication if you aren't having pain. - Drink lots of decaffeinated fluids. - Drink prune juice and/or eat dried prunes   If the first 3 don't work start with additional solutions - Take Colace - an over-the-counter stool softener - Take Senokot - an over-the-counter laxative - Take Miralax - a stronger over-the-counter laxative  For more information including helpful videos and documents visit our website:   [email or web address]

## 2020-04-19 NOTE — Anesthesia Procedure Notes (Signed)
Procedure Name: Intubation Date/Time: 04/19/2020 10:37 AM Performed by: Ezequiel Kayser, CRNA Pre-anesthesia Checklist: Patient identified, Emergency Drugs available, Suction available and Patient being monitored Patient Re-evaluated:Patient Re-evaluated prior to induction Oxygen Delivery Method: Circle system utilized Preoxygenation: Pre-oxygenation with 100% oxygen Induction Type: IV induction Ventilation: Mask ventilation without difficulty Laryngoscope Size: Mac and 4 Grade View: Grade I Tube type: Oral Tube size: 7.0 mm Number of attempts: 1 Airway Equipment and Method: Stylet and Oral airway Placement Confirmation: ETT inserted through vocal cords under direct vision,  positive ETCO2 and breath sounds checked- equal and bilateral Secured at: 23 cm Tube secured with: Tape Dental Injury: Teeth and Oropharynx as per pre-operative assessment  Comments: Eyes taped prior to mask ventilation

## 2020-04-19 NOTE — Progress Notes (Signed)
Assisted Dr. Singer with left, ultrasound guided, interscalene  block. Side rails up, monitors on throughout procedure. See vital signs in flow sheet. Tolerated Procedure well.  

## 2020-04-19 NOTE — Anesthesia Postprocedure Evaluation (Signed)
Anesthesia Post Note  Patient: Bryce King  Procedure(s) Performed: SHOULDER ARTHROSCOPY DEBRIDEMENT EXTENSIVE, DISTAL CLAVICULECTOMY WITH ROTATOR CUFF REPAIR WITH BICEPS TENODESIS (Left Shoulder) BICEPS TENODESIS (Left Shoulder)     Patient location during evaluation: PACU Anesthesia Type: General Level of consciousness: sedated Pain management: pain level controlled Vital Signs Assessment: post-procedure vital signs reviewed and stable Respiratory status: spontaneous breathing and respiratory function stable Cardiovascular status: stable Postop Assessment: no apparent nausea or vomiting Anesthetic complications: no   No complications documented.  Last Vitals:  Vitals:   04/19/20 1215 04/19/20 1223  BP: 136/84 123/62  Pulse: 80 76  Resp: 18 15  Temp:    SpO2: 99% 96%    Last Pain:  Vitals:   04/19/20 1215  TempSrc:   PainSc: 0-No pain                 Francie Keeling DANIEL

## 2020-04-19 NOTE — Anesthesia Procedure Notes (Signed)
Anesthesia Regional Block: Interscalene brachial plexus block   Pre-Anesthetic Checklist: ,, timeout performed, Correct Patient, Correct Site, Correct Laterality, Correct Procedure, Correct Position, site marked, Risks and benefits discussed,  Surgical consent,  Pre-op evaluation,  At surgeon's request and post-op pain management  Laterality: Left  Prep: chloraprep       Needles:  Injection technique: Single-shot  Needle Type: Echogenic Stimulator Needle     Needle Length: 5cm  Needle Gauge: 22     Additional Needles:   Narrative:  Start time: 04/19/2020 8:44 AM End time: 04/19/2020 8:54 AM Injection made incrementally with aspirations every 5 mL.  Performed by: Personally  Anesthesiologist: Duane Boston, MD  Additional Notes: Functioning IV was confirmed and monitors applied.  A 76mm 22ga echogenic arrow stimulator was used. Sterile prep and drape,hand hygiene and sterile gloves were used.Ultrasound guidance: relevant anatomy identified, needle position confirmed, local anesthetic spread visualized around nerve(s)., vascular puncture avoided.  Image printed for medical record.  Negative aspiration and negative test dose prior to incremental administration of local anesthetic. The patient tolerated the procedure well.

## 2020-04-19 NOTE — Transfer of Care (Signed)
Immediate Anesthesia Transfer of Care Note  Patient: Bryce King  Procedure(s) Performed: SHOULDER ARTHROSCOPY DEBRIDEMENT EXTENSIVE, DISTAL CLAVICULECTOMY WITH ROTATOR CUFF REPAIR WITH BICEPS TENODESIS (Left Shoulder) BICEPS TENODESIS (Left Shoulder)  Patient Location: PACU  Anesthesia Type:GA combined with regional for post-op pain  Level of Consciousness: awake, alert  and oriented  Airway & Oxygen Therapy: Patient Spontanous Breathing and Patient connected to face mask oxygen  Post-op Assessment: Report given to RN and Post -op Vital signs reviewed and stable  Post vital signs: Reviewed and stable  Last Vitals:  Vitals Value Taken Time  BP 150/92 04/19/20 1155  Temp 36.6 C 04/19/20 1155  Pulse 81 04/19/20 1156  Resp 17 04/19/20 1156  SpO2 100 % 04/19/20 1156  Vitals shown include unvalidated device data.  Last Pain:  Vitals:   04/19/20 0801  TempSrc: Oral  PainSc: 0-No pain         Complications: No complications documented.

## 2020-04-19 NOTE — H&P (Signed)
PREOPERATIVE H&P  Chief Complaint: LEFT SHOULDER OSTEOARTHRITIS, ROTATOR CUFF TEAR, BICIPITAL TENDINITIS  HPI: Bryce King is a 60 y.o. male who is scheduled for, Procedure(s): SHOULDER ARTHROSCOPY DEBRIDEMENT EXTENSIVE, DISTAL CLAVICULECTOMY WITH ROTATOR CUFF REPAIR WITH BICEPS TENODESIS.   Patient has a past medical history significant for sleep apnea on CPAP, HTN, hyperlipidemia.   [name] has had left shoulder pain which has gotten worse in the last couple of months. He also had right shoulder pain earlier this year which was worse. He reports this feels like a torn rotator cuff like he had in his right shoulder in the past. He denies any specific trauma or injury. He said he has trouble sleeping as the pain in the left shoulder is waking him up at night.   His symptoms are rated as moderate to severe, and have been worsening.  This is significantly impairing activities of daily living.    Please see clinic note for further details on this patient's care.    He has elected for surgical management.   Past Medical History:  Diagnosis Date  . Allergy   . Arthritis   . Dizziness   . Heart palpitations   . Hyperlipidemia   . Hypertension   . OSA (obstructive sleep apnea)    Uses CPAP  . Sleep apnea    wears cpap    Past Surgical History:  Procedure Laterality Date  . ELBOW SURGERY Right 2005  . REVERSE SHOULDER ARTHROPLASTY Right 08/10/2019   Procedure: REVERSE SHOULDER ARTHROPLASTY;  Surgeon: Hiram Gash, MD;  Location: [facility];  Service: Orthopedics;  Laterality: Right;  . ROTATOR CUFF REPAIR Right 2007   Social History   Socioeconomic History  . Marital status: Married    Spouse name: Not on file  . Number of children: 1  . Years of education: Not on file  . Highest education level: Some college, no degree  Occupational History  . Not on file  Tobacco Use  . Smoking status: Current Every Day Smoker    Packs/day: 1.00    Types: Cigarettes  .  Smokeless tobacco: Never Used  Vaping Use  . Vaping Use: Never used  Substance and Sexual Activity  . Alcohol use: Yes    Comment: weekly, "07/21/18 2 drinks a week"   . Drug use: Never  . Sexual activity: Not on file  Other Topics Concern  . Not on file  Social History Narrative   Lives with wife   Caffeine- coffee 2 cups, tea all day   Social Determinants of Health   Financial Resource Strain: Not on file  Food Insecurity: Not on file  Transportation Needs: Not on file  Physical Activity: Not on file  Stress: Not on file  Social Connections: Not on file   Family History  Problem Relation Age of Onset  . Cancer Father   . Depression Father   . Diabetes Father   . Prostate cancer Father   . COPD Maternal Grandmother   . Heart attack Maternal Grandfather   . Heart attack Paternal Grandfather   . Colon cancer Neg Hx   . Colon polyps Neg Hx   . Esophageal cancer Neg Hx   . Rectal cancer Neg Hx   . Stomach cancer Neg Hx    Allergies  Allergen Reactions  . Statins Other (See Comments)    Muscle aches   Prior to Admission medications   Medication Sig Start Date End Date Taking? Authorizing Provider  Evolocumab (  REPATHA SURECLICK) 973 MG/ML SOAJ Inject 140 mg into the skin every 14 (fourteen) days. 04/10/20 07/09/20 Yes Luetta Nutting, DO  ezetimibe (ZETIA) 10 MG tablet Take 1 tablet (10 mg total) by mouth daily. 02/03/20  Yes Luetta Nutting, DO  levocetirizine (XYZAL) 5 MG tablet Take 5 mg by mouth every evening.   Yes [provider]  terbinafine (LAMISIL) 250 MG tablet Take 1 tablet (250 mg total) by mouth daily. 02/03/20  Yes Luetta Nutting, DO  lisinopril-hydrochlorothiazide (ZESTORETIC) 20-12.5 MG tablet Take 1 tablet by mouth daily. 04/17/20   Emeterio Reeve, DO  mometasone (ELOCON) 0.1 % cream APPLY TO AFFECTED AREA EVERY DAY 11/01/19   Luetta Nutting, DO  Multiple Vitamin (MULTIVITAMIN WITH MINERALS) TABS tablet Take 1 tablet by mouth daily.    [provider]  nitroGLYCERIN (NITROSTAT) 0.4 MG SL tablet Place 0.4 mg under the tongue every 5 (five) minutes as needed for chest pain.  Patient not taking: Reported on 01/12/2020    [provider]  propranolol (INDERAL) 10 MG tablet Take 1 tablet (10 mg total) by mouth daily as needed (for palpitations). Patient not taking: Reported on 01/12/2020 09/10/18 01/12/20  Richardson Dopp T, PA-C    ROS: All other systems have been reviewed and were otherwise negative with the exception of those mentioned in the HPI and as above.  Physical Exam: General: Alert, no acute distress Cardiovascular: No pedal edema Respiratory: No cyanosis, no use of accessory musculature GI: No organomegaly, abdomen is soft and non-tender Skin: No lesions in the area of chief complaint Neurologic: Sensation intact distally Psychiatric: Patient is competent for consent with normal mood and affect Lymphatic: No axillary or cervical lymphadenopathy  MUSCULOSKELETAL:  Left shoulder: active forward flexion to about 170 degrees. Abduction is to about 90 degrees. External rotation out to about 70 degrees. He has increased pain with lift off and belly press with resisted internal and external rotation. He has a negative empty can, negative O'Brien's  and a negative Hawkins test.   Imaging: Xrays demonstrate no acute bony abnormalities  Assessment: LEFT SHOULDER OSTEOARTHRITIS, ROTATOR CUFF TEAR, BICIPITAL TENDINITIS  Plan: Plan for Procedure(s): SHOULDER ARTHROSCOPY DEBRIDEMENT EXTENSIVE, DISTAL CLAVICULECTOMY WITH ROTATOR CUFF REPAIR WITH BICEPS TENODESIS  The risks benefits and alternatives were discussed with the patient including but not limited to the risks of nonoperative treatment, versus surgical intervention including infection, bleeding, nerve injury,  blood clots, cardiopulmonary complications, morbidity, mortality, among others, and they were willing to proceed.   The patient acknowledged the  explanation, agreed to proceed with the plan and consent was signed.   Operative Plan: Left shoulder scope, SAD, DCE, RCR, BT Discharge Medications: Tylenol, Celebrex 200 mg, Oxycodone, Zofran DVT Prophylaxis: None Physical Therapy: Outpatient PT Special Discharge needs: [place], PA-C  04/19/2020 6:25 AM

## 2020-04-19 NOTE — Interval H&P Note (Signed)
History and Physical Interval Note:  04/19/2020 9:59 AM  Bryce King  has presented today for surgery, with the diagnosis of LEFT SHOULDER OSTEOARTHRITIS, ROTATOR CUFF TEAR, BICIPITAL TENDINITIS.  The various methods of treatment have been discussed with the patient and family. After consideration of risks, benefits and other options for treatment, the patient has consented to  Procedure(s): SHOULDER ARTHROSCOPY DEBRIDEMENT EXTENSIVE, DISTAL CLAVICULECTOMY WITH ROTATOR CUFF REPAIR WITH BICEPS TENODESIS (Left) as a surgical intervention.  The patient's history has been reviewed, patient examined, no change in status, stable for surgery.  I have reviewed the patient's chart and labs.  Questions were answered to the patient's satisfaction.     Hiram Gash

## 2020-04-20 ENCOUNTER — Encounter (HOSPITAL_BASED_OUTPATIENT_CLINIC_OR_DEPARTMENT_OTHER): Payer: Self-pay | Admitting: Orthopaedic Surgery

## 2020-05-01 ENCOUNTER — Encounter: Payer: Self-pay | Admitting: Physician Assistant

## 2020-05-01 NOTE — Progress Notes (Addendum)
Virtual Visit via Video Note   This visit type was conducted due to national recommendations for restrictions regarding the COVID-19 Pandemic (e.g. social distancing) in an effort to limit this patient's exposure and mitigate transmission in our community.  Due to his co-morbid illnesses, this patient is at least at moderate risk for complications without adequate follow up.  This format is felt to be most appropriate for this patient at this time.  All issues noted in this document were discussed and addressed.  A limited physical exam was performed with this format.  Please refer to the patient's chart for his consent to telehealth for [facility].      The patient was identified using 2 identifiers.  Date:  05/02/2020   ID:  Bryce King, DOB December 25, 1960, MRN [id number]  Patient Location: Home Provider Location: Home Office  PCP:  Luetta Nutting, DO  Cardiologist:  Lauree Chandler, MD (see below regarding referral to Dr. Radford Pax) Electrophysiologist:  None   Evaluation Performed:  Follow-Up Visit  Chief Complaint:  F/u PVCs  History of Present Illness:    Bryce King is a 60 y.o. male with hypertension, hyperlipidemia (statin intolerant, managed by primary care), sleep apnea, PVCs, prior tobacco abuse.  He was admitted 05/2019 with chest pain and dizziness and ruled out. He was noted to have PVCs on EKG. Cardiac enzymes remain normal.  Echocardiogram demonstrated normal LV function. He underwent an ETT which was low risk. Event monitor showed NSR with rare PVCs (1%), average HR 85bpm. Smoking cessation advised. He eventually followed up with primary care for possible Meniere's disease and did a visit with ENT virtually and was told that was unlikely. His dizziness has since resolved. His PCP has been managing his HLD with Zetia and Repatha.  He is seen virtually today and doing well. He has no cardiac symptoms of CP, SOB, palpitations, syncope or near-syncope. He has  been using CPAP for over a decade but does not have anyone following this. His last sleep study was about 13 years ago and machine is 26-18 years old. He buys updated supplies online. Initial BP was elevated at 171/85 but this was when he got in the door after carrying groceries. Follow-up value was 155/91 soon after. Most recent BPs in EMR were otherwise generally controlled.  Labs Independently Reviewed 03/2020 K 4.4, Cr 0.82 01/2020 LDL 132, trig 171 (PCP), LFTS wnl 05/2019 TSH wnl  Past Medical History:  Diagnosis Date   Allergy    Arthritis    Hyperlipidemia    Hypertension    OSA (obstructive sleep apnea)    Uses CPAP   PVC's (premature ventricular contractions)    Past Surgical History:  Procedure Laterality Date   BICEPT TENODESIS Left 04/19/2020   Procedure: BICEPS TENODESIS;  Surgeon: Hiram Gash, MD;  Location: [facility];  Service: Orthopedics;  Laterality: Left;   ELBOW SURGERY Right 2005   REVERSE SHOULDER ARTHROPLASTY Right 08/10/2019   Procedure: REVERSE SHOULDER ARTHROPLASTY;  Surgeon: Hiram Gash, MD;  Location: [facility];  Service: Orthopedics;  Laterality: Right;   ROTATOR CUFF REPAIR Right 2007   SHOULDER ARTHROSCOPY WITH ROTATOR CUFF REPAIR AND SUBACROMIAL DECOMPRESSION Left 04/19/2020   Procedure: SHOULDER ARTHROSCOPY DEBRIDEMENT EXTENSIVE, DISTAL CLAVICULECTOMY WITH ROTATOR CUFF REPAIR WITH BICEPS TENODESIS;  Surgeon: Hiram Gash, MD;  Location: [place];  Service: Orthopedics;  Laterality: Left;     Current Meds  Medication Sig   acetaminophen (TYLENOL) 500 MG tablet  Take 2 tablets (1,000 mg total) by mouth every 8 (eight) hours for 14 days.   celecoxib (CELEBREX) 200 MG capsule Take 1 capsule (200 mg total) by mouth 2 (two) times daily.   Evolocumab (REPATHA SURECLICK) XX123456 MG/ML SOAJ Inject 140 mg into the skin every 14 (fourteen) days.   ezetimibe (ZETIA) 10 MG tablet Take 1 tablet (10 mg total) by mouth daily.    levocetirizine (XYZAL) 5 MG tablet Take 5 mg by mouth every evening.   lisinopril-hydrochlorothiazide (ZESTORETIC) 20-12.5 MG tablet Take 1 tablet by mouth daily.   mometasone (ELOCON) 0.1 % cream APPLY TO AFFECTED AREA EVERY DAY   Multiple Vitamin (MULTIVITAMIN WITH MINERALS) TABS tablet Take 1 tablet by mouth daily.   nitroGLYCERIN (NITROSTAT) 0.4 MG SL tablet Place 0.4 mg under the tongue every 5 (five) minutes as needed for chest pain.   oxycodone (OXY-IR) 5 MG capsule Take 5 mg by mouth every 6 (six) hours as needed for pain.   terbinafine (LAMISIL) 250 MG tablet Take 1 tablet (250 mg total) by mouth daily.     Allergies:   Statins   Social History   Tobacco Use   Smoking status: Current Every Day Smoker    Packs/day: 1.00    Types: Cigarettes   Smokeless tobacco: Never Used  Vaping Use   Vaping Use: Never used  Substance Use Topics   Alcohol use: Yes    Comment: weekly, "07/21/18 2 drinks a week"    Drug use: Never     Family Hx: The patient's family history includes COPD in his maternal grandmother; Cancer in his father; Depression in his father; Diabetes in his father; Heart attack in his maternal grandfather and paternal grandfather; Prostate cancer in his father. There is no history of Colon cancer, Colon polyps, Esophageal cancer, Rectal cancer, or Stomach cancer.  ROS:   Please see the history of present illness.    All other systems reviewed and are negative.   Prior CV studies:   The following studies were reviewed today:  2D echo 05/2018 1. The left ventricle has normal systolic function, with an ejection  fraction of 55-60%. The cavity size was normal. Left ventricular diastolic  parameters were normal No evidence of left ventricular regional wall  motion abnormalities.  2. The right ventricle has normal systolic function. The cavity was  normal. There is no increase in right ventricular wall thickness. Right  ventricular systolic pressure  could not be assessed.  3. The mitral valve is normal in structure.  4. The tricuspid valve is normal in structure.  5. The aortic valve is tricuspid.  6. No evidence of left ventricular regional wall motion abnormalities.  7. Right atrial pressure is estimated at 3 mmHg.   ETT 05/2018  Blood pressure demonstrated a normal response to exercise.  There was no ST segment deviation noted during stress.   ETT with good exercise tolerance (9:00); no chest pain; normal blood pressure response; no ST changes; negative adequate exercise tolerance test; [facility] treadmill score 9.  Event monitor 05/2018 Sinus rhythm Rare premature ventricular contractions      Labs/Other Tests and Data Reviewed:    EKG:  An ECG dated 08/05/19 was personally reviewed today and demonstrated:  NSR 85bpm no acute STT changes  Recent Labs: 06/06/2019: TSH 1.44 08/05/2019: Hemoglobin 15.5; Platelets 198 02/03/2020: ALT 32 04/17/2020: BUN 12; Creatinine, Ser 0.82; Potassium 4.4; Sodium 138   Recent Lipid Panel Lab Results  Component Value Date/Time   CHOL 199  02/03/2020 11:16 AM   TRIG 171 (H) 02/03/2020 11:16 AM   HDL 36 (L) 02/03/2020 11:16 AM   CHOLHDL 5.5 (H) 02/03/2020 11:16 AM   LDLCALC 132 (H) 02/03/2020 11:16 AM    Wt Readings from Last 3 Encounters:  05/02/20 217 lb (98.4 kg)  04/19/20 217 lb 6 oz (98.6 kg)  02/06/20 222 lb 6.4 oz (100.9 kg)     Objective:    Vital Signs:  BP (!) 155/91    Ht 6' (1.829 m)    Wt 217 lb (98.4 kg)    BMI 29.43 kg/m    VS reviewed. General - calm M in no acute distress Pulm - No labored breathing, no coughing during visit, no audible wheezing, speaking in full sentences Neuro - A+Ox3, no slurred speech, answers questions appropriately Psych - Pleasant affect  ASSESSMENT & PLAN:    1. PVCs - generally quiescent. Very low burden by prior event monitor. Continue surveillance for any new symptoms.  2. Essential HTN - BP elevated on initial check but usually OK  by EMR. We need a better idea of what it's running at home. The patient was provided instructions on monitoring blood pressure at home and relaying results to [place] next week.  3. OSA - no one is actively managing this. Sleep study was over a decade ago and he has had this machine for 6-7 years. I offered referral to our sleep team for management to ensure optimal fit and settings, especially given elevated BP reading as above. He would like to pursue this. We will touch base with them. I suspect they will require an updated sleep study to assess current state which he is agreeable to.  Time:   Today, I have spent 12 minutes with the patient with telehealth technology discussing the above problems.     Medication Adjustments/Labs and Tests Ordered: Current medicines are reviewed at length with the patient today.  Testing and concerns regarding medicines are outlined above.    Follow Up: Will refer to Dr. Radford Pax to establish care for sleep apnea. Since he does not otherwise a significant cardiac history, it may make sense for her to also monitor his PVCs going forward. I will let her decide in follow-up. Will cc to Dr. Angelena Form to find out if he is OK with this plan - addendum: he states he is.  Signed, Charlie Pitter, PA-C  05/02/2020 3:20 PM    [facility]

## 2020-05-02 ENCOUNTER — Other Ambulatory Visit: Payer: Self-pay

## 2020-05-02 ENCOUNTER — Telehealth (INDEPENDENT_AMBULATORY_CARE_PROVIDER_SITE_OTHER): Payer: Managed Care, Other (non HMO) | Admitting: Physician Assistant

## 2020-05-02 ENCOUNTER — Encounter: Payer: Self-pay | Admitting: Physician Assistant

## 2020-05-02 ENCOUNTER — Telehealth: Payer: Self-pay | Admitting: *Deleted

## 2020-05-02 VITALS — BP 155/91 | Ht 72.0 in | Wt 217.0 lb

## 2020-05-02 DIAGNOSIS — I10 Essential (primary) hypertension: Secondary | ICD-10-CM

## 2020-05-02 DIAGNOSIS — G4733 Obstructive sleep apnea (adult) (pediatric): Secondary | ICD-10-CM | POA: Diagnosis not present

## 2020-05-02 DIAGNOSIS — I493 Ventricular premature depolarization: Secondary | ICD-10-CM | POA: Diagnosis not present

## 2020-05-02 DIAGNOSIS — E785 Hyperlipidemia, unspecified: Secondary | ICD-10-CM

## 2020-05-02 NOTE — Patient Instructions (Addendum)
Medication Instructions:  Your physician recommends that you continue on your current medications as directed. Please refer to the Current Medication list given to you today.  *If you need a refill on your cardiac medications before your next appointment, please call your pharmacy*   Lab Work: None ordered  If you have labs (blood work) drawn today and your tests are completely normal, you will receive your results only by: [name] MyChart Message (if you have MyChart) OR . A paper copy in the mail If you have any lab test that is abnormal or we need to change your treatment, we will call you to review the results.   Testing/Procedures: Your physician has recommended that you have a sleep study. They will call you to arrange this once they get authorization from the insurance company.   This test records several body functions during sleep, including: brain activity, eye movement, oxygen and carbon dioxide blood levels, heart rate and rhythm, breathing rate and rhythm, the flow of air through your mouth and nose, snoring, body muscle movements, and chest and belly movement.     Follow-Up: At [facility], you and your health needs are our priority.  As part of our continuing mission to provide you with exceptional heart care, we have created designated Provider Care Teams.  These Care Teams include your primary Cardiologist (physician) and Advanced Practice Providers (APPs -  Physician Assistants and Nurse Practitioners) who all work together to provide you with the care you need, when you need it.  We recommend signing up for the patient portal called "MyChart".  Sign up information is provided on this After Visit Summary.  MyChart is used to connect with patients for Virtual Visits (Telemedicine).  Patients are able to view lab/test results, encounter notes, upcoming appointments, etc.  Non-urgent messages can be sent to your provider as well.   To learn more about what you can do with  MyChart, go to [email or web address].    Your next appointment:   After the Sleep Study  The format for your next appointment:   In Person  Provider:   You may see Fransico Him, MD or one of the following Advanced Practice Providers on your designated Care Team:      Other Instructions  I would recommend using a blood pressure cuff that goes on your arm. The wrist ones can be inaccurate. If possible, try to select one that also reports your heart rate. To check your blood pressure, choose a time at least 3 hours after taking your blood pressure medicines. If you can sample it at different times of the day, that's great - it might give you more information about how your blood pressure fluctuates. Remain seated in a chair for 5 minutes quietly beforehand, then check it. Please record a list of those readings and call us/send in MyChart message with them for our review early next week.

## 2020-05-02 NOTE — Telephone Encounter (Signed)
  Patient Consent for Virtual Visit         Bryce King has provided verbal consent on 05/02/2020 for a virtual visit (video or telephone).   CONSENT FOR VIRTUAL VISIT FOR:  Bryce King  By participating in this virtual visit I agree to the following:  I hereby voluntarily request, consent and authorize [place] and its employed or contracted physicians, physician assistants, nurse practitioners or other licensed health care professionals (the Practitioner), to provide me with telemedicine health care services (the "Services") as deemed necessary by the treating Practitioner. I acknowledge and consent to receive the Services by the Practitioner via telemedicine. I understand that the telemedicine visit will involve communicating with the Practitioner through live audiovisual communication technology and the disclosure of certain medical information by electronic transmission. I acknowledge that I have been given the opportunity to request an in-person assessment or other available alternative prior to the telemedicine visit and am voluntarily participating in the telemedicine visit.  I understand that I have the right to withhold or withdraw my consent to the use of telemedicine in the course of my care at any time, without affecting my right to future care or treatment, and that the Practitioner or I may terminate the telemedicine visit at any time. I understand that I have the right to inspect all information obtained and/or recorded in the course of the telemedicine visit and may receive copies of available information for a reasonable fee.  I understand that some of the potential risks of receiving the Services via telemedicine include:  [name] Delay or interruption in medical evaluation due to technological equipment failure or disruption; . Information transmitted may not be sufficient (e.g. poor resolution of images) to allow for appropriate medical decision making by the  Practitioner; and/or  . In rare instances, security protocols could fail, causing a breach of personal health information.  Furthermore, I acknowledge that it is my responsibility to provide information about my medical history, conditions and care that is complete and accurate to the best of my ability. I acknowledge that Practitioner's advice, recommendations, and/or decision may be based on factors not within their control, such as incomplete or inaccurate data provided by me or distortions of diagnostic images or specimens that may result from electronic transmissions. I understand that the practice of medicine is not an exact science and that Practitioner makes no warranties or guarantees regarding treatment outcomes. I acknowledge that a copy of this consent can be made available to me via my patient portal ([place]), or I can request a printed copy by calling the office of [place].    I understand that my insurance will be billed for this visit.   I have read or had this consent read to me. . I understand the contents of this consent, which adequately explains the benefits and risks of the Services being provided via telemedicine.  . I have been provided ample opportunity to ask questions regarding this consent and the Services and have had my questions answered to my satisfaction. . I give my informed consent for the services to be provided through the use of telemedicine in my medical care

## 2020-05-04 ENCOUNTER — Telehealth: Payer: Self-pay | Admitting: *Deleted

## 2020-05-04 NOTE — Telephone Encounter (Signed)
Staff message sent to Gae Bon to schedule patient to be seen by Dr Radford Pax. Patient is currently on CPAP therapy, however is not being managed by anyone.

## 2020-05-07 DIAGNOSIS — Z79899 Other long term (current) drug therapy: Secondary | ICD-10-CM

## 2020-05-07 MED ORDER — AMLODIPINE BESYLATE 5 MG PO TABS: 5.0000 mg | ORAL_TABLET | Freq: Every day | ORAL | 3 refills | Status: DC

## 2020-05-07 NOTE — Telephone Encounter (Signed)
Please thank patient for circling back! BP clearly remains above goal. Please have patient double his lisinopril-HCTZ to 2 tablets daily with BMET 7-10 days after starting higher dose. It does not look like there is an available tablet size for the 40/25mg  tablet so would just have him do the 2 tablets daily with new rx for enough tablets sent in. Keep log of BP in the meantime. We'll touch base with him about his readings when we get his BMET.

## 2020-05-07 NOTE — Telephone Encounter (Signed)
Thanks! I would add amlodipine 5mg  daily and have him f/u with his primary care for further blood pressure management within the next 1-2 weeks. If he cannot get in to see them in this timeframe, submit readings like he did to our office after several days of readings. Decrease salt/processed foods/alcohol in diet. Exercise, healthy diet, reduced stress and good sleep can also help. In the future, if he has more palpitations, we can consider carvedilol instead, but I think amlodipine is a good place to start. I am still showing only 1 tab daily of the lisinopril/HCTZ on MAR so I'm not sure that crossed over.

## 2020-05-07 NOTE — Telephone Encounter (Signed)
Spoke with the patient who states that he is already taking lisinopril-HCTZ 20/12.5 two tablets daily. He said his PCP increased this a while back. I have updated his med list. Will route back to [facility] for further advisement since patient is already taking 2 tablets daily.

## 2020-05-09 ENCOUNTER — Telehealth: Payer: Self-pay | Admitting: *Deleted

## 2020-05-09 NOTE — Telephone Encounter (Signed)
-----   Message from Lauralee Evener, [place] sent at 05/04/2020 12:42 PM EST ----- Regarding: RE: [place] this patient is already using a CPAP machine. He just needs appointment with TT to manage. Please schedule OV. TT can decide what he needs if anything when she sees him. ----- Message ----- From: Jeanann Lewandowsky, RMA Sent: 05/02/2020   3:16 PM EST To: Cv Div Sleep Studies Subject: SLEEP STUDY                                    Pt needs a sleep study and a new pt appt with Dr. Radford Pax for OSA.  Also, pt only has rare PVC's and [name] said she can also manage that since pt will see him for sleep.  Thanks!

## 2020-05-14 NOTE — Telephone Encounter (Signed)
Make sure patient started amlodipine as per outlined notes below. If so, have patient increase amlodipine to 10mg  daily and submit readings in 1 week

## 2020-05-21 ENCOUNTER — Other Ambulatory Visit: Payer: Self-pay

## 2020-05-21 ENCOUNTER — Telehealth (INDEPENDENT_AMBULATORY_CARE_PROVIDER_SITE_OTHER): Payer: Managed Care, Other (non HMO) | Admitting: Cardiology

## 2020-05-21 ENCOUNTER — Encounter: Payer: Self-pay | Admitting: Cardiology

## 2020-05-21 VITALS — BP 142/80 | HR 99 | Ht 72.0 in | Wt 218.0 lb

## 2020-05-21 DIAGNOSIS — I493 Ventricular premature depolarization: Secondary | ICD-10-CM | POA: Diagnosis not present

## 2020-05-21 DIAGNOSIS — G4733 Obstructive sleep apnea (adult) (pediatric): Secondary | ICD-10-CM

## 2020-05-21 DIAGNOSIS — I10 Essential (primary) hypertension: Secondary | ICD-10-CM

## 2020-05-21 NOTE — Progress Notes (Signed)
Virtual Visit via Video Note   This visit type was conducted due to national recommendations for restrictions regarding the COVID-19 Pandemic (e.g. social distancing) in an effort to limit this patient's exposure and mitigate transmission in our community.  Due to his co-morbid illnesses, this patient is at least at moderate risk for complications without adequate follow up.  This format is felt to be most appropriate for this patient at this time.  All issues noted in this document were discussed and addressed.  A limited physical exam was performed with this format.  Please refer to the patient's chart for his consent to telehealth for [facility].  The patient was identified using 2 identifiers.  Date:  05/21/2020   ID:  Bryce King, DOB [birth year], MRN [id number]  Patient Location: Home Provider Location: Home Office  PCP:  Luetta Nutting, DO  Cardiologist:  Lauree Chandler, MD  Electrophysiologist:  None   Evaluation Performed: OSA   History of Present Illness:    Bryce King is a 60 y.o. male with hypertension, hyperlipidemia (statin intolerant, managed by primary care), sleep apnea, PVCs, prior tobacco abuse.  He has a hx of OSA and has been using CPAP for over a decade but does not have anyone following this. His last sleep study was about 14-15 years ago and machine is 49-1 years old. He buys updated supplies online. He is now here for sleep evaluation.    He is doing well with his CPAP device.  He tolerates the nasal pillow mask with chin strap and feels the pressure is adequate.  Since going on CPAP he feels rested in the am and has no significant daytime sleepiness.  He does not think that he snores.  He recently had shoulder surgery and has to sleep in a recliner and so his chin strap has not been working as well and he gets dry mouth. He does not know for sure what his pressure setting is.    Past Medical History:  Diagnosis Date  . Allergy   .  Arthritis   . Hyperlipidemia   . Hypertension   . OSA (obstructive sleep apnea)    Uses CPAP  . PVC's (premature ventricular contractions)    Past Surgical History:  Procedure Laterality Date  . BICEPT TENODESIS Left 04/19/2020   Procedure: BICEPS TENODESIS;  Surgeon: Hiram Gash, MD;  Location: [place];  Service: Orthopedics;  Laterality: Left;  . ELBOW SURGERY Right 2005  . REVERSE SHOULDER ARTHROPLASTY Right 08/10/2019   Procedure: REVERSE SHOULDER ARTHROPLASTY;  Surgeon: Hiram Gash, MD;  Location: [facility];  Service: Orthopedics;  Laterality: Right;  . ROTATOR CUFF REPAIR Right 2007  . SHOULDER ARTHROSCOPY WITH ROTATOR CUFF REPAIR AND SUBACROMIAL DECOMPRESSION Left 04/19/2020   Procedure: SHOULDER ARTHROSCOPY DEBRIDEMENT EXTENSIVE, DISTAL CLAVICULECTOMY WITH ROTATOR CUFF REPAIR WITH BICEPS TENODESIS;  Surgeon: Hiram Gash, MD;  Location: [place];  Service: Orthopedics;  Laterality: Left;     Current Meds  Medication Sig  . amLODipine (NORVASC) 5 MG tablet Take 10 mg by mouth daily.  . Evolocumab (REPATHA SURECLICK) 798 MG/ML SOAJ Inject 140 mg into the skin every 14 (fourteen) days.  [name] ezetimibe (ZETIA) 10 MG tablet Take 1 tablet (10 mg total) by mouth daily.  [name] levocetirizine (XYZAL) 5 MG tablet Take 5 mg by mouth every evening.  [name] lisinopril-hydrochlorothiazide (ZESTORETIC) 20-12.5 MG tablet Take 2 tablets by mouth daily.  . mometasone (ELOCON) 0.1 % cream APPLY TO  AFFECTED AREA EVERY DAY  . Multiple Vitamin (MULTIVITAMIN WITH MINERALS) TABS tablet Take 1 tablet by mouth daily.  . nitroGLYCERIN (NITROSTAT) 0.4 MG SL tablet Place 0.4 mg under the tongue every 5 (five) minutes as needed for chest pain.  [name] oxycodone (OXY-IR) 5 MG capsule Take 5 mg by mouth every 6 (six) hours as needed for pain.  [name] propranolol (INDERAL) 10 MG tablet Take 1 tablet (10 mg total) by mouth daily as needed (for palpitations).     Allergies:   Statins   Social  History   Tobacco Use  . Smoking status: Current Every Day Smoker    Packs/day: 1.00    Types: Cigarettes  . Smokeless tobacco: Never Used  Vaping Use  . Vaping Use: Never used  Substance Use Topics  . Alcohol use: Yes    Comment: weekly, "07/21/18 2 drinks a week"   . Drug use: Never     Family Hx: The patient's family history includes COPD in his maternal grandmother; Cancer in his father; Depression in his father; Diabetes in his father; Heart attack in his maternal grandfather and paternal grandfather; Prostate cancer in his father. There is no history of Colon cancer, Colon polyps, Esophageal cancer, Rectal cancer, or Stomach cancer.  ROS:   Please see the history of present illness.    All other systems reviewed and are negative.   Prior CV studies:   The following studies were reviewed today:  2D echo 05/2018 1. The left ventricle has normal systolic function, with an ejection  fraction of 55-60%. The cavity size was normal. Left ventricular diastolic  parameters were normal No evidence of left ventricular regional wall  motion abnormalities.  2. The right ventricle has normal systolic function. The cavity was  normal. There is no increase in right ventricular wall thickness. Right  ventricular systolic pressure could not be assessed.  3. The mitral valve is normal in structure.  4. The tricuspid valve is normal in structure.  5. The aortic valve is tricuspid.  6. No evidence of left ventricular regional wall motion abnormalities.  7. Right atrial pressure is estimated at 3 mmHg.   ETT 05/2018  Blood pressure demonstrated a normal response to exercise.  There was no ST segment deviation noted during stress.   ETT with good exercise tolerance (9:00); no chest pain; normal blood pressure response; no ST changes; negative adequate exercise tolerance test; [facility] treadmill score 9.  Event monitor 05/2018 Sinus rhythm Rare premature ventricular contractions       Labs/Other Tests and Data Reviewed:    EKG:  An ECG dated 08/05/19 was personally reviewed today and demonstrated:  NSR 85bpm no acute STT changes  Recent Labs: 06/06/2019: TSH 1.44 08/05/2019: Hemoglobin 15.5; Platelets 198 02/03/2020: ALT 32 04/17/2020: BUN 12; Creatinine, Ser 0.82; Potassium 4.4; Sodium 138   Recent Lipid Panel Lab Results  Component Value Date/Time   CHOL 199 02/03/2020 11:16 AM   TRIG 171 (H) 02/03/2020 11:16 AM   HDL 36 (L) 02/03/2020 11:16 AM   CHOLHDL 5.5 (H) 02/03/2020 11:16 AM   LDLCALC 132 (H) 02/03/2020 11:16 AM    Wt Readings from Last 3 Encounters:  05/21/20 218 lb (98.9 kg)  05/02/20 217 lb (98.4 kg)  04/19/20 217 lb 6 oz (98.6 kg)     Objective:    Vital Signs:  BP (!) 142/80   Pulse 99   Ht 6' (1.829 m)   Wt 218 lb (98.9 kg)   BMI 29.57 kg/m  Well nourished, well developed male in no acute distress. Well appearing, alert and conversant, regular work of breathing,  good skin color  Eyes- anicteric mouth- oral mucosa is pink  neuro- grossly intact skin- no apparent rash or lesions or cyanosis  ASSESSMENT & PLAN:    1. PVCs  -these are well controlled.   2. Essential HTN -BP well controlled -continue Amlodipine 10mg  daily, Lisinopril-HCT 20-12.5mg  daily  3. OSA - The patient is tolerating PAP therapy well without any problems.  The patient has been using and benefiting from PAP use and will continue to benefit from therapy.  -his device is over 22 years old and has not had a sleep study in over 14 years -I will order a split night sleep study to allow [place] to get a new device and set up with new DME -he will see me back after he gets on PAP therapy  Time:   Today, I have spent 20 minutes with the patient with telehealth technology discussing the above problems.   Medication Adjustments/Labs and Tests Ordered: Current medicines are reviewed at length with the patient today.  Testing and concerns regarding medicines are outlined  above.    Follow Up: Followup after he gets his new PAP device  Signed, Fransico Him, MD  05/21/2020 1:36 PM    [facility]

## 2020-05-23 ENCOUNTER — Telehealth: Payer: Self-pay | Admitting: *Deleted

## 2020-05-23 DIAGNOSIS — G4733 Obstructive sleep apnea (adult) (pediatric): Secondary | ICD-10-CM

## 2020-05-23 NOTE — Telephone Encounter (Signed)
-----   Message from Lauralee Evener, [place] sent at 05/23/2020  2:56 PM EST ----- Regarding: RE: precert [employer] denied in lab sleep study. Approved HST.   Auth # [id number]   Valid dates 05/23/20 to 11/19/20. ----- Message ----- From: Freada Bergeron, CMA Sent: 05/23/2020  10:09 AM EST To: Cv Div Sleep Studies Subject: precert                                        Split night ----- Message ----- From: Sueanne Margarita, MD Sent: 05/21/2020   1:43 PM EST To: Freada Bergeron, CMA  Please order a split night sleep study to get a new device - he has a hx of OSA and device is over 15 years old and has not had an study in 14 years.

## 2020-05-23 NOTE — Telephone Encounter (Signed)
Staff message sent to Bryce King denied in lab sleep study. Approved HST.Auth # [id number]. Valid dates 05/23/20 to 11/19/20.

## 2020-05-23 NOTE — Telephone Encounter (Signed)
Medical records does not accept CDs or thumb drives due to the possibility of viruses.  We only accept paper, then it can be scanned into his chart. Sorry!

## 2020-05-23 NOTE — Telephone Encounter (Signed)
Order sent to sleep pool.

## 2020-05-23 NOTE — Telephone Encounter (Signed)
-----   Message from Sueanne Margarita, MD sent at 05/21/2020  1:43 PM EST ----- Please order a split night sleep study to get a new device - he has a hx of OSA and device is over 59 years old and has not had an study in 14 years.

## 2020-05-24 NOTE — Telephone Encounter (Signed)
Message from Lauralee Evener, [place] sent at 05/23/2020  2:56 PM EST ----- Regarding: RE: precert [employer] denied in lab sleep study. Approved HST.   Auth # [id number]   Valid dates 05/23/20 to 11/19/20. ----- Message ----- From: Freada Bergeron, CMA Sent: 05/23/2020  10:09 AM EST To: Cv Div Sleep Studies Subject: precert                                        Split night ----- Message ----- From: Sueanne Margarita, MD Sent: 05/21/2020   1:43 PM EST To: Freada Bergeron, CMA  Please order a split night sleep study to get a new device - he has a hx of OSA and device is over 64 years old and has not had an study in 14 years.

## 2020-05-24 NOTE — Telephone Encounter (Signed)
Patient is aware and agreeable to Home Sleep Study through [facility]. Patient is scheduled for 06-25-20 at 12 to pick up home sleep kit and meet with Respiratory therapist at [facility]. Patient is aware that if this appointment date and time does not work for them they should contact Artis Delay directly at [phone]. Patient is aware that a sleep packet will be sent from [facility] in week. Patient is agreeable to treatment and thankful for call.-

## 2020-06-05 ENCOUNTER — Telehealth: Payer: Self-pay | Admitting: *Deleted

## 2020-06-05 ENCOUNTER — Other Ambulatory Visit: Payer: Managed Care, Other (non HMO) | Admitting: *Deleted

## 2020-06-05 ENCOUNTER — Ambulatory Visit (INDEPENDENT_AMBULATORY_CARE_PROVIDER_SITE_OTHER): Payer: Managed Care, Other (non HMO) | Admitting: Pharmacist

## 2020-06-05 ENCOUNTER — Other Ambulatory Visit: Payer: Self-pay

## 2020-06-05 VITALS — BP 138/62 | HR 89

## 2020-06-05 DIAGNOSIS — F17210 Nicotine dependence, cigarettes, uncomplicated: Secondary | ICD-10-CM | POA: Diagnosis not present

## 2020-06-05 DIAGNOSIS — I10 Essential (primary) hypertension: Secondary | ICD-10-CM | POA: Diagnosis not present

## 2020-06-05 DIAGNOSIS — Z79899 Other long term (current) drug therapy: Secondary | ICD-10-CM

## 2020-06-05 MED ORDER — BUPROPION HCL ER (XL) 150 MG PO TB24: ORAL_TABLET | ORAL | 0 refills | Status: DC

## 2020-06-05 MED ORDER — CARVEDILOL 3.125 MG PO TABS: 3.1250 mg | ORAL_TABLET | Freq: Two times a day (BID) | ORAL | 3 refills | Status: DC

## 2020-06-05 NOTE — Progress Notes (Signed)
Patient ID: Bryce King                 DOB: Oct 08, 1960                      MRN: [id number]     HPI: Bryce King is a 60 y.o. male patient of Dr.Turner's/McAlhany's referred by [name], PA to HTN clinic. PMH is significant for hypertension, hyperlipidemia (statin intolerant, managed by primary care),sleep apnea, PVCs, prior tobacco abuse. Patient was see [name] via telemedicine 05/02/20. His BP was elevated. He was asked to check at home and send in readings. BP was still up so amlodipine was increased to 10mg  daily.   He presents today for follow up. He denies dizziness, lightheadedness, headache, blurred vision, SOB, chest pain or swelling. He had rotator cuff surgery in January and a shoulder replacement last May. Takes Celebrex 200mg  BID. Has some pain with shoulders. Does not take decongestants or OTC NSAIDS. Takes APAP prn.  He reports being terrible with salt and drinking ice tea all day long. Is an every day smoker but wants to quite. Quite for 16 years previously using Wellbutrin. Does drink a fair amount of alcohol when his friends come over to watch the fight. Does not drink every night.   Brought his home meter with him. Found to be accurate. Clinic cuff: 138/62 Home cuff: 135/67 He is very active in his yard, bulding fence, chopping wood ext. But does not get an aerobic exercise. Reports that sometimes he feels like his heart is racing.  Current HTN meds: lisinopril 40/25mg  daily, amlodipine 10mg  daily, propranolol (prn palpitations) BP goal: <130/80  Family History: The patient's family history includes COPD in his maternal grandmother; Cancer in his father; Depression in his father; Diabetes in his father; Heart attack in his maternal grandfather and paternal grandfather; Prostate cancer in his father. There is no history of Colon cancer, Colon polyps, Esophageal cancer, Rectal cancer, or Stomach cancer.  Social History: current smoker 1 ppd, + alcohol (3-4 drinks  per day), smokes CBD  Diet: breakfast: orange fruit smoothie and BLT Venison, fish  Exercise: yard work, not much recently due to shoulder surgery  Home BP readings: 142/80,150/81,156/70,134/73,160/70,166/77 HR 90's  Wt Readings from Last 3 Encounters:  05/21/20 218 lb (98.9 kg)  05/02/20 217 lb (98.4 kg)  04/19/20 217 lb 6 oz (98.6 kg)   BP Readings from Last 3 Encounters:  05/21/20 (!) 142/80  05/02/20 (!) 155/91  04/19/20 (!) 136/93   Pulse Readings from Last 3 Encounters:  05/21/20 99  04/19/20 77  02/06/20 86    Renal function: CrCl cannot be calculated (Patient's most recent lab result is older than the maximum 21 days allowed.).  Past Medical History:  Diagnosis Date  . Allergy   . Arthritis   . Hyperlipidemia   . Hypertension   . OSA (obstructive sleep apnea)    Uses CPAP  . PVC's (premature ventricular contractions)     Current Outpatient Medications on File Prior to Visit  Medication Sig Dispense Refill  . amLODipine (NORVASC) 5 MG tablet Take 10 mg by mouth daily.    . Evolocumab (REPATHA SURECLICK) 297 MG/ML SOAJ Inject 140 mg into the skin every 14 (fourteen) days. 6 mL 1  . ezetimibe (ZETIA) 10 MG tablet Take 1 tablet (10 mg total) by mouth daily. 90 tablet 3  . levocetirizine (XYZAL) 5 MG tablet Take 5 mg by mouth every evening.    [name]  lisinopril-hydrochlorothiazide (ZESTORETIC) 20-12.5 MG tablet Take 2 tablets by mouth daily.    . mometasone (ELOCON) 0.1 % cream APPLY TO AFFECTED AREA EVERY DAY 45 g 0  . Multiple Vitamin (MULTIVITAMIN WITH MINERALS) TABS tablet Take 1 tablet by mouth daily.    . nitroGLYCERIN (NITROSTAT) 0.4 MG SL tablet Place 0.4 mg under the tongue every 5 (five) minutes as needed for chest pain.    [name] oxycodone (OXY-IR) 5 MG capsule Take 5 mg by mouth every 6 (six) hours as needed for pain.    [name] propranolol (INDERAL) 10 MG tablet Take 1 tablet (10 mg total) by mouth daily as needed (for palpitations). 30 tablet 1   No current  facility-administered medications on file prior to visit.    Allergies  Allergen Reactions  . Statins Other (See Comments)    Muscle aches    There were no vitals taken for this visit.   Assessment/Plan:  1. Hypertension - Blood pressure is above goal of <130/80 in clinic today. Patients HR's at home have also been in the 90's and sometimes >100. He reports sometimes his heart feels likes its racing. Will start carvedilol 3.125mg  BID to help with both the blood pressure and the heart rate. Continue lisinopril 40/25mg  daily and amlodipine 10mg  daily. Patient encouraged to increase exercise- goal of 197min per week of aerobic exercise (walking/elipical ect). We discussed decreasing caffeine intake by drinking 1/2 caf tea or decaf tea. Watch food labels and minimize salt shaker use. Also encouraged patient to keep alcohol in take to 2 drinks per occasion. Defined 1 drink. Continue checking blood pressure/HR at home. Follow up in 2 weeks.  2. Tobacco abuse- Patient interested in quitting. We discussed Wellbutrin and Chantix. He had success in past with Wellbutrin. Due availability issues with chantix and past success with Wellbutrin will use that. Wellbutrin XL 150mg  daily for 3 days then increase to 300mg  daily. Patient advised to choose a quite date at least 1 week after starting Wellbutrin. Usually in second week. He should continue for 12 weeks for best results.  Thank you  Ramond Dial, Pharm.D, BCPS, CPP [place]  [address] [place], [place] [address]  Phone: [phone]; Fax: [phone]

## 2020-06-05 NOTE — Telephone Encounter (Signed)
Error, see previous phone note.

## 2020-06-05 NOTE — Telephone Encounter (Signed)
Called pt with recommendations below.  Pt will come in this afternoon to see PharmD in the BP clinic and will bring his home bp cuff to measure against.  Pt aware will also get a TSH today as well.  Pt verbalized understanding.

## 2020-06-05 NOTE — Addendum Note (Signed)
Addended by: Gaetano Net on: 06/05/2020 08:30 AM   Modules accepted: Orders

## 2020-06-05 NOTE — Patient Instructions (Signed)
It was nice to meet you today!   Please try drinking decaf tea or 1/2 and 1/2.  Start taking carvedilol 3.125mg  twice a day Continue lisinopril 40/25mg  daily and amlodipine 10mg  daily  Keep checking your blood pressure at home and bring in a log to your next appointment  Choose a quite date. Start taking Wellbutrin at least 1 week before target quit date. Target quit dates are generally in the second week of treatment.  Start with 1 tablet of Wellbutrin XL (150mg ) daily for 3 days then increase to 2 tablets (300mg  daily).  Call me at [phone] with any questions  Hypertension "High blood pressure"  Hypertension is often called "The Silent Killer." It rarely causes symptoms until it is extremely  high or has done damage to other organs in the body. For this reason, you should have your  blood pressure checked regularly by your physician. We will check your blood pressure  every time you see a provider at one of our offices.   Your blood pressure reading consists of two numbers. Ideally, blood pressure should be  below 120/80. The first ("top") number is called the systolic pressure. It measures the  pressure in your arteries as your heart beats. The second ("bottom") number is called the diastolic pressure. It measures the pressure in your arteries as the heart relaxes between beats.  The benefits of getting your blood pressure under control are enormous. A 10-point  reduction in systolic blood pressure can reduce your risk of stroke by 27% and heart failure by 28%  Your blood pressure goal is <130/80  To check your pressure at home you will need to:  1. Sit up in a chair, with feet flat on the floor and back supported. Do not cross your ankles or legs. 2. Rest your left arm so that the cuff is about heart level. If the cuff goes on your upper arm,  then just relax the arm on the table, arm of the chair or your lap. If you have a wrist cuff, we  suggest relaxing your wrist  against your chest (think of it as Pledging the Flag with the  wrong arm).  3. Place the cuff snugly around your arm, about 1 inch above the crook of your elbow. The  cords should be inside the groove of your elbow.  4. Sit quietly, with the cuff in place, for about 5 minutes. After that 5 minutes press the power  button to start a reading. 5. Do not talk or move while the reading is taking place.  6. Record your readings on a sheet of paper. Although most cuffs have a memory, it is often  easier to see a pattern developing when the numbers are all in front of you.  7. You can repeat the reading after 1-3 minutes if it is recommended  Make sure your bladder is empty and you have not had caffeine or tobacco within the last 30 min  Always bring your blood pressure log with you to your appointments. If you have not brought your monitor in to be double checked for accuracy, please bring it to your next appointment.  You can find a list of validated (accurate) blood pressure cuffs at PopPath.it  Healthy Diet  SALT  What is the big deal with sodium? Why the need to limit our intake? What is the connection to  blood pressure? And what is the difference between salt and sodium? Sodium attracts water. Think about it. When you  eat an overly salty snack, you tend to become  thirsty and need more water. If you have too much sodium in your bloodstream, your body will  then pull water into the bloodstream as well, trying to correct the imbalance. When you have  more volume in the bloodstream, your blood pressure goes up. Your heart must work harder to  pump the extra volume, and the increase in pressure can wear out blood vessels faster. You may  also notice bloating and weight gain. Hypertension is one of the leading risk factors for heart  disease. By limiting sodium intake throughout life you are helping decrease your risk of heart  disease later on.   Salt is made up of two minerals. Sodium  and chloride. A teaspoon of salt contains about 40%  sodium and 60% chloride. One teaspoon of salt has 2,300 mg of sodium. While the current  USDA guideline states you should consume no more than 2,300 mg per day, both they and the  American Heart Association recommend that you limit this to 1,500 mg to stay healthy. Sea salt  or [place] pink salt may have a slightly different taste, but they still have almost the same  percent of sodium per teaspoon. So feel free to use them instead of table salt, but don't use  more.  A common myth is that if you don't add salt to your food, you are following a low sodium diet.  However, 75% of the sodium consumed in the American diet is from processed foods, NOT the  salt shaker. We all know that chips and crackers are high in sodium, but there are many other  foods that we may not think of when limiting our sodium. Below are the "salty six" foods that  the American Heart Association wants you to be aware of. 1. Cold cuts - even the healthy sliced [place] can have over 1,000 mg of sodium per slice.   Compare different brands to see which has less sodium if you eat these regularly 2. Pizza - depending on your toppings, a slice of pizza can have up to 760 mg of   sodium. Put more veggies on it or just have a slice with a side salad and still enjoy. 3. Soup - yes even that old home remedy of chicken soup is loaded with sodium. Look   for low sodium versions. Or add a bunch of frozen veggies when heating it, this will   give you less sodium per serving 4. Breads - they may not taste salty, but a single slice of bread can have up to 230 mg of   sodium. Toast for breakfast, a sandwich at lunch and a dinner roll can quickly add up   to over 900 mg in just one day.  5. Chicken - some fresh or frozen chicken is injected with a sodium solution before it   reaches the store. A 4 oz serving should have no more than 100 mg sodium. And   watch for breaded frozen  chicken nuggets, strips and tenders. They may seem like a   quick and easy "healthy" meal, but they have high amounts of sodium as well. 6. Burritos/tacos - just 2 teaspoons of taco seasoning can have over 400 mg sodium.   Try making your own with equal parts of cumin, oregano, chili powder and garlic powder.   SUGAR  Sugar is a huge problem in the modern day diet. Sugar is a HUGE contributor to heart disease,  diabetes, high triglyceride levels, fatty liver diease and obesity. Sugar is hidden in almost all packaged foods/beverages. It adds no nutritional benefit to your body and can cause major harm. Added sugar is extra sugar that is added beyond what is naturally found. The American Heart Association recommends limiting added sugars to no more than 25g for women and 36 grams for men per day.  There are many names for sugar maltose, sucrose (names ending in "ose"), high fructose corn syrup, molasses, cane sugar, corn sweetener, raw sugar, syrup, honey or fruit juice concentrate.   One of the best ways to limit your added sugars is to stop drinking sweetened beverages such as soda, sweet tea, fruit juice or fancy coffee's. There is 65g of added sugars in one 20oz bottle of Coke!! That is equal to 6 donuts.   Pay attention and read all nutrition facts labels. Below is an examples of a nutrition facts label. The #1 is showing you the total sugars where the # 2 is showing you the added sugars. This one severing has almost the max amount of added sugars per day!  Watch out for items that say "low fat" or "no added sugar" as these products are typically very high in sugar. The food industry uses these terms to fool you into thinking they are healthy.  For more information on the dangers of sugar watch WHY Sugar is as Bad as Alcohol (Fructose, The Liver Toxin) on YouTube.    EXERCISE  Exercise can help lower your blood pressure ~5 points systolic (top #) and 8 points diastolic (bottom #)  Exercise  is good. We've all heard that. In an ideal world, we would all have time and resources to  get plenty of it. When you are active your heart pumps more efficiently and you will feel better.  Multiple studies show that even walking regularly has benefits that include living a longer life.  The American Heart Association recommends 90-150 minutes per week of exercise (30 minutes  per day most days of the week). You can do this in any increment you wish. Nine or more  10-minute walks count. So does an hour-long exercise class. Break the time apart into what will  work in your life. Some of the best things you can do include walking briskly, jogging, cycling or  swimming laps. Not everyone is ready to "exercise." Sometimes we need to start with just getting active. Here  are some easy ways to be more active throughout the day: [name] Take the stairs instead of the elevator . Go for a 10-15 minute walk during your lunch break (find a friend to make it more enjoyable) . When shopping, park at the back of the parking lot . If you take public transportation, get off one stop early and walk the extra distance . Pace around while making phone calls (most of [place] are not attached to phone cords any longer!) Check with your doctor if you aren't sure what your limitations may be. Always remember to drink plenty of water when doing any type of exercise. Don't feel like a failure if you're not getting the 90-150 minutes per week. If you started by being  a couch potato, then just a 10-minute walk each day is a huge improvement. Start with little  victories and work your way up.   Healthy Eating Tips  When looking to improve your eating habits, whether to lose weight, lower blood pressure or just be healthier, it helps to know  what a serving size is.   Grains 1 slice of bread,  bagel,  cup pasta or rice  Vegetables 1 cup fresh or raw vegetables,  cup cooked or canned Fruits 1 piece of medium sized fruit,   cup canned,   Meats/Proteins  cup dried       1 oz meat, 1 egg,  cup cooked beans, nuts or seeds  Dairy        Fats Individual yogurt container, 1 cup (8oz)    1 teaspoon margarine/butter or vegetable  milk or milk alternative, 1 slice of cheese          oil; 1 tablespoon mayonnaise or salad dressing                  Plan ahead: make a menu of the meals for a week then create a grocery list to go with  that menu. Consider meals that easily stretch into a night of leftovers, such as stews or  casseroles. Or consider making two of your favorite meal and put one in the freezer or fridge for  another night.  When you get home from the grocery store wash and prepare your vegetables and fruits.  Then when you need them they are ready to go.  Tips for going to the grocery store: . Buy store or generic brands . Check the weekly ad from your store on-line or in their in-store flyer . Look at the unit price on the shelf tag to compare/contrast the costs of different items . Buy fruits/vegetables in season . Carrots, bananas and apples are low-cost, naturally healthy items . If meats or frozen vegetables are on sale, buy some extras and put in your freezer . Limit buying prepared or "ready to eat" items, even if they are pre-made salads or fruit snacks . Do not shop when you're hungry . Foods at eye level tend to be more expensive. Look on the high and low shelves for deals. . Consider shopping at the farmer's market for fresh foods in season. . Choose canned tuna or salmon instead of fresh . Avoid the cookie and chip aisles (these are expensive, high in calories and low in  nutritional value) Healthy food preparations: . If you can't get lean hamburger, be sure to drain the fat when cooking . Steam, saut (in olive oil), grill or bake foods . Experiment with different seasonings to avoid adding salt to your foods. Kosher salt, sea salt and [place] salt are all still salt and should be  avoided          Aim to have one 12 hour fast each day. This means no eating after dinner until breakfast. For example, if you eat dinner around 6 PM then you would not eat anything until 6 AM the next day. This is a great way to help lower your insulin levels, lose weight and reduce your blood pressure.  Resources: American Heart Association - InstantFinish.fi Go to the Healthy Living tab to get more information American Diabetes Association - www.diabetes.org You don't have to be diabetic - check out the Food and Fitness tab  DASH diet - https://wilson-eaton.com/ Health topics - or just search on their home page for DASH Quit for Life - www.cancer.org Follow the Stay Healthy tab to learn more about smoking cessatio

## 2020-06-05 NOTE — Telephone Encounter (Addendum)
Since BP is still suboptimally controlled, would recommend arranging OV in the pharmD clinic or having him see his PCP to crosscheck his home BP monitor against the office measurement. Would also get a TSH at that visit. Either location would be fine with me. If BP is indeed confirmed elevated and HR is OK, would anticipate starting carvedilol (and D/C PRN propranolol) as next step.  Gabriel Paulding PA-C

## 2020-06-06 LAB — TSH: TSH: 1.01 u[IU]/mL (ref 0.450–4.500)

## 2020-06-07 NOTE — Telephone Encounter (Signed)
Patient returning call from [place] to discuss lab results

## 2020-06-07 NOTE — Telephone Encounter (Signed)
Called patient back. Patient stated he already talked to [facility].

## 2020-06-13 MED ORDER — AMLODIPINE BESYLATE 10 MG PO TABS: 10.0000 mg | ORAL_TABLET | Freq: Every day | ORAL | 3 refills | Status: DC

## 2020-06-13 MED ORDER — LISINOPRIL-HYDROCHLOROTHIAZIDE 20-12.5 MG PO TABS: 2.0000 | ORAL_TABLET | Freq: Every day | ORAL | 3 refills | Status: DC

## 2020-06-13 NOTE — Addendum Note (Signed)
Addended by: Gaetano Net on: 06/13/2020 10:46 AM   Modules accepted: Orders

## 2020-06-13 NOTE — Telephone Encounter (Signed)
OK to refill please Will also forward requested readings to pharmD who saw patient last - thanks [facility]

## 2020-06-19 ENCOUNTER — Encounter: Payer: Self-pay | Admitting: Family Medicine

## 2020-06-19 ENCOUNTER — Encounter (HOSPITAL_BASED_OUTPATIENT_CLINIC_OR_DEPARTMENT_OTHER): Payer: Self-pay

## 2020-06-20 ENCOUNTER — Other Ambulatory Visit: Payer: Self-pay | Admitting: Family Medicine

## 2020-06-20 ENCOUNTER — Ambulatory Visit (INDEPENDENT_AMBULATORY_CARE_PROVIDER_SITE_OTHER): Payer: Managed Care, Other (non HMO) | Admitting: Pharmacist

## 2020-06-20 ENCOUNTER — Other Ambulatory Visit: Payer: Self-pay

## 2020-06-20 VITALS — BP 130/76 | HR 78

## 2020-06-20 DIAGNOSIS — I10 Essential (primary) hypertension: Secondary | ICD-10-CM | POA: Diagnosis not present

## 2020-06-20 DIAGNOSIS — E782 Mixed hyperlipidemia: Secondary | ICD-10-CM

## 2020-06-20 MED ORDER — NICOTINE 7 MG/24HR TD PT24: 7.0000 mg | MEDICATED_PATCH | Freq: Every day | TRANSDERMAL | 0 refills | Status: DC

## 2020-06-20 MED ORDER — NICOTINE 14 MG/24HR TD PT24: 14.0000 mg | MEDICATED_PATCH | Freq: Every day | TRANSDERMAL | 0 refills | Status: DC

## 2020-06-20 MED ORDER — NICOTINE 21 MG/24HR TD PT24: 21.0000 mg | MEDICATED_PATCH | Freq: Every day | TRANSDERMAL | 0 refills | Status: DC

## 2020-06-20 MED ORDER — CARVEDILOL 6.25 MG PO TABS: 6.2500 mg | ORAL_TABLET | Freq: Two times a day (BID) | ORAL | 3 refills | Status: DC

## 2020-06-20 MED ORDER — REPATHA SURECLICK 140 MG/ML ~~LOC~~ SOAJ: 140.0000 mg | SUBCUTANEOUS | 1 refills | Status: AC

## 2020-06-20 NOTE — Telephone Encounter (Signed)
Updated lipid panel ordered. 

## 2020-06-20 NOTE — Progress Notes (Signed)
Patient ID: Bryce King                 DOB: Apr 10, 1960                      MRN: [id number]     HPI: Bryce King is a 60 y.o. male patient of Dr.Turner's/McAlhany's referred by [name], PA to HTN clinic. PMH is significant for hypertension, hyperlipidemia (statin intolerant, managed by primary care),sleep apnea, PVCs, prior tobacco abuse.  Patient was seen [name] via telemedicine 05/02/20. His BP was elevated. He was asked to check at home and send in readings. BP was still up so amlodipine was increased to 10mg  daily.   Patient was seen in HTN clinic on 06/05/20. His BP was 138/62. HR at home was in the 90's and patient reported feeling like his heart was racing sometimes. Carvedilol 3.125mg  BID was started. He was also prescribed Wellbutrin for smoking cessation and encouraged to choose a quite date.  At previous visit patient brought his home meter with him. Found to be accurate. Clinic cuff: 138/62 Home cuff: 135/67  Patient presents today for follow up. He denies dizziness, lightheadedness, headache, blurred vision, SOB, chest pain or swelling. He quite smoking 8 days ago. Has been using the welbutrin XL and 21mg  nicotine patch. Doing well, does have some cravings and is a little cranky per his wife's report. He has decreased his caffeine intake some. Less tea and more water. Is active in his yard but hasn't done anything on his treadmill or elliptical except plug them in. Has not had any episodes of his heart racing since starting carvedilol. Home BP has been in the 130's/70's. He did forget his log, but recalls readings pretty well. HR has been 70-80. Still having pain in his shoulders, especially when sleeping.  Current HTN meds: lisinopril 40/25mg  daily, amlodipine 10mg  daily, carvedilol 3.125mg  twice a day, propranolol (prn palpitations) BP goal: <130/80  Family History: The patient's family history includes COPD in his maternal grandmother; Cancer in his father; Depression  in his father; Diabetes in his father; Heart attack in his maternal grandfather and paternal grandfather; Prostate cancer in his father. There is no history of Colon cancer, Colon polyps, Esophageal cancer, Rectal cancer, or Stomach cancer.  Social History: current smoker 1 ppd, + alcohol (3-4 drinks per day), smokes CBD  Diet: breakfast: orange fruit smoothie and BLT Venison, fish  Exercise: yard work, not much recently due to shoulder surgery  Home BP readings: 130's/70's HR 72-80  Wt Readings from Last 3 Encounters:  05/21/20 218 lb (98.9 kg)  05/02/20 217 lb (98.4 kg)  04/19/20 217 lb 6 oz (98.6 kg)   BP Readings from Last 3 Encounters:  06/05/20 138/62  05/21/20 (!) 142/80  05/02/20 (!) 155/91   Pulse Readings from Last 3 Encounters:  06/05/20 89  05/21/20 99  04/19/20 77    Renal function: CrCl cannot be calculated (Patient's most recent lab result is older than the maximum 21 days allowed.).  Past Medical History:  Diagnosis Date  . Allergy   . Arthritis   . Hyperlipidemia   . Hypertension   . OSA (obstructive sleep apnea)    Uses CPAP  . PVC's (premature ventricular contractions)     Current Outpatient Medications on File Prior to Visit  Medication Sig Dispense Refill  . amLODipine (NORVASC) 10 MG tablet Take 1 tablet (10 mg total) by mouth daily. 90 tablet 3  . [facility]  XL) 150 MG 24 hr tablet Take one tablet by mouth daily for 3 days then take 2 tablets by mouth daily 180 tablet 0  . calcium-vitamin D (OSCAL WITH D) 250-125 MG-UNIT tablet Take 1 tablet by mouth daily.    . carvedilol (COREG) 3.125 MG tablet Take 1 tablet (3.125 mg total) by mouth 2 (two) times daily. 180 tablet 3  . celecoxib (CELEBREX) 200 MG capsule Take 200 mg by mouth 2 (two) times daily.    . Evolocumab (REPATHA SURECLICK) 466 MG/ML SOAJ Inject 140 mg into the skin every 14 (fourteen) days. 6 mL 1  . ezetimibe (ZETIA) 10 MG tablet Take 1 tablet (10 mg total) by mouth daily.  90 tablet 3  . levocetirizine (XYZAL) 5 MG tablet Take 5 mg by mouth every evening.    [name] lisinopril-hydrochlorothiazide (ZESTORETIC) 20-12.5 MG tablet Take 2 tablets by mouth daily. 90 tablet 3  . melatonin 5 MG TABS Take 5 mg by mouth at bedtime.    . mometasone (ELOCON) 0.1 % cream APPLY TO AFFECTED AREA EVERY DAY 45 g 0  . Multiple Vitamin (MULTIVITAMIN WITH MINERALS) TABS tablet Take 1 tablet by mouth daily.    . nitroGLYCERIN (NITROSTAT) 0.4 MG SL tablet Place 0.4 mg under the tongue every 5 (five) minutes as needed for chest pain.    [name] propranolol (INDERAL) 10 MG tablet Take 1 tablet (10 mg total) by mouth daily as needed (for palpitations). 30 tablet 1  . vitamin C (ASCORBIC ACID) 500 MG tablet Take 500 mg by mouth daily.     No current facility-administered medications on file prior to visit.    Allergies  Allergen Reactions  . Statins Other (See Comments)    Muscle aches    There were no vitals taken for this visit.   Assessment/Plan:  1. Hypertension - Blood pressure is just above goal of <130/80 in clinic today. Home readings slightly higher. Will increase carvedilol to 6.25mg  twice a day. Continue lisinopril 40/25mg  daily and amlodipine 10mg  daily. Ive encouraged him again to increase aerobic exercise. Follow up in clinic in 3 weeks.  2. Tobacco abuse- Patient quite 8 days ago. Going well so far. Ive written him Rx for patches incase his insurance will cover them. Needs 4 more weeks of 21mg , then 2 weeks of 14mg  and 2 weeks of 7mg . I congratulated him on quitting.  Thank you  Ramond Dial, Pharm.D, BCPS, CPP [facility]  [address] [place], [place] [address]  Phone: [phone]; Fax: [phone]

## 2020-06-20 NOTE — Patient Instructions (Addendum)
Please increase your carvedilol to 6.25mg  twice a day. You may take 2 of the 3.125mg  tablets two times a day until you run out. Then take 1 6.25mg  tablet twice a day  Try to increase your aerobic exercise (ie. Walking)  Call me at [phone] with any questions  Doristine Devoid work on quitting smoking!  Keep checking your blood pressure and heart rate daily.

## 2020-06-23 LAB — LIPID PANEL W/REFLEX DIRECT LDL
Cholesterol: 88 mg/dL (ref <200)
HDL: 40 mg/dL (ref >=40)
LDL Cholesterol (Calc): 26 mg/dL (calc)
Non-HDL Cholesterol (Calc): 48 mg/dL (calc) (ref <130)
Total CHOL/HDL Ratio: 2.2 (calc) (ref <5.0)
Triglycerides: 132 mg/dL (ref <150)

## 2020-06-25 ENCOUNTER — Other Ambulatory Visit: Payer: Self-pay

## 2020-06-25 ENCOUNTER — Ambulatory Visit (HOSPITAL_BASED_OUTPATIENT_CLINIC_OR_DEPARTMENT_OTHER): Payer: Managed Care, Other (non HMO) | Attending: Cardiology | Admitting: Cardiology

## 2020-06-25 DIAGNOSIS — G4733 Obstructive sleep apnea (adult) (pediatric): Secondary | ICD-10-CM | POA: Diagnosis not present

## 2020-06-27 NOTE — Procedures (Signed)
   Patient Name: Bryce King, Bryce King Date: 06/25/2020 Gender: Male D.O.B: 27-Jul-1960 Age (years): 31 Referring Provider: Fransico Him MD, ABSM Height (inches): 72 Interpreting Physician: Fransico Him MD, ABSM Weight (lbs): 218 RPSGT: Jacolyn Reedy BMI: 30 MRN: [id number]   CLINICAL INFORMATION Sleep Study Type: HST  Indication for sleep study: N/A  Epworth Sleepiness Score: N/A  SLEEP STUDY TECHNIQUE A multi-channel overnight portable sleep study was performed. The channels recorded were: nasal airflow, thoracic respiratory movement, and oxygen saturation with a pulse oximetry. Snoring was also monitored.  MEDICATIONS Patient self administered medications include: N/A.  SLEEP ARCHITECTURE Patient was studied for 520.7 minutes. The sleep efficiency was 100.0 % and the patient was supine for 0%. The arousal index was 0.0 per hour.  RESPIRATORY PARAMETERS The overall AHI was 34.6 per hour, with a central apnea index of 0 per hour.  The oxygen nadir was 86% during sleep.  CARDIAC DATA Mean heart rate during sleep was 59.5 bpm.  IMPRESSIONS - Severe obstructive sleep apnea occurred during this study (AHI = 34.6/h). - Mild oxygen desaturation was noted during this study (Min O2 = 86%). - Patient snored 27.3% during the sleep.  DIAGNOSIS - Obstructive Sleep Apnea (G47.33)  RECOMMENDATIONS - Recommend in lab CPAP titration - Avoid alcohol, sedatives and other CNS depressants that may worsen sleep apnea and disrupt normal sleep architecture. - Sleep hygiene should be reviewed to assess factors that may improve sleep quality. - Weight management and regular exercise should be initiated or continued.  [Electronically signed] 06/27/2020 08:49 PM  Fransico Him MD, ABSM Diplomate, American Board of Sleep Medicine

## 2020-07-05 ENCOUNTER — Telehealth: Payer: Self-pay | Admitting: *Deleted

## 2020-07-05 ENCOUNTER — Telehealth: Payer: Self-pay | Admitting: Cardiology

## 2020-07-05 NOTE — Telephone Encounter (Signed)
Patient was returning a call from our office. Patient believed it was in regards to a sleep study he had. Please call back

## 2020-07-05 NOTE — Telephone Encounter (Signed)
-----   Message from Sueanne Margarita, MD sent at 06/27/2020  8:51 PM EDT ----- Please let patient know that they have sleep apnea and recommend CPAP titration. Please set up titration in the sleep lab.

## 2020-07-05 NOTE — Telephone Encounter (Signed)
Informed patient of sleep study results and patient understanding was verbalized. Patient understands his sleep study showed they have sleep apnea and recommend CPAP titration. Please set up titration in the sleep lab.   Pt is aware of his results.  Titration sent to sleep pool.

## 2020-07-10 ENCOUNTER — Telehealth: Payer: Self-pay | Admitting: *Deleted

## 2020-07-10 DIAGNOSIS — G4733 Obstructive sleep apnea (adult) (pediatric): Secondary | ICD-10-CM

## 2020-07-10 NOTE — Telephone Encounter (Signed)
Staff message sent to [facility] I received a denial from patient's insurance for CPAP titration. Denial letter faxed for Dr Radford Pax to review.

## 2020-07-10 NOTE — Telephone Encounter (Signed)
Received a denial from patient's insurance. Please notify the ordering provider.  Dr Radford Pax notified.

## 2020-07-10 NOTE — Telephone Encounter (Signed)
Patient understandshissleep study showed they have sleep apnea and recommend CPAP titration. Pt is aware of his results.

## 2020-07-12 ENCOUNTER — Ambulatory Visit: Payer: Managed Care, Other (non HMO)

## 2020-07-17 NOTE — Addendum Note (Signed)
Addended by: Freada Bergeron on: 07/17/2020 05:25 PM   Modules accepted: Orders

## 2020-07-17 NOTE — Telephone Encounter (Addendum)
Per dr Radford Pax, Per dr Radford Pax, order cpap on auto from 4-15 cm H20 with heated humidity mask of choice with 6 week follow up.  Order placed to [facility].  Upon patient request DME selection is [facility] Patient understands he will be contacted by [facility] to set up his cpap. Patient understands to call if [place] does not contact him with new setup in a timely manner. Patient understands they will be called once confirmation has been received from [facility] that they have received their new machine to schedule 10 week follow up appointment.   [facility] notified of new cpap order  Please add to airview Patient was grateful for the call and thanked me

## 2020-07-24 ENCOUNTER — Other Ambulatory Visit: Payer: Self-pay

## 2020-07-24 ENCOUNTER — Ambulatory Visit (INDEPENDENT_AMBULATORY_CARE_PROVIDER_SITE_OTHER): Payer: Managed Care, Other (non HMO) | Admitting: Pharmacist

## 2020-07-24 VITALS — BP 136/70 | HR 66

## 2020-07-24 DIAGNOSIS — I10 Essential (primary) hypertension: Secondary | ICD-10-CM | POA: Diagnosis not present

## 2020-07-24 MED ORDER — LISINOPRIL 40 MG PO TABS: 40.0000 mg | ORAL_TABLET | Freq: Every day | ORAL | 3 refills | Status: DC

## 2020-07-24 MED ORDER — CHLORTHALIDONE 25 MG PO TABS: 12.5000 mg | ORAL_TABLET | Freq: Every day | ORAL | 3 refills | Status: DC

## 2020-07-24 NOTE — Progress Notes (Signed)
Patient ID: Bryce King                 DOB: 1960/09/17                      MRN: [id number]     HPI: Bryce King is a 60 y.o. male patient of Dr.Turner's/McAlhany's referred by [name], PA to HTN clinic. PMH is significant for hypertension, hyperlipidemia (statin intolerant, managed by primary care),sleep apnea, PVCs, prior tobacco abuse.  Patient was seen [name] via telemedicine 05/02/20. His BP was elevated. He was asked to check at home and send in readings. BP was still up so amlodipine was increased to 10mg  daily.   Patient was seen in HTN clinic on 06/05/20. His BP was 138/62. HR at home was in the 90's and patient reported feeling like his heart was racing sometimes. Carvedilol 3.125mg  BID was started. He was also prescribed Wellbutrin for smoking cessation and encouraged to choose a quite date.  At last visit on 06/20/20 patients blood pressure was 130/76 in clinic. Reports blood pressures in the 130's/70 at home. Carvedilol was increased to 6.25mg  twice a day and he was encouraged to increase his aerobic exercise. He also reported quitting smoking with wellbutrin XL and nicotine patch.  At previous visit patient brought his home meter with him. Found to be accurate. Clinic cuff: 138/62 Home cuff: 135/67  Patient presents today for follow up. He denies dizziness, lightheadedness, headache, blurred vision, SOB, chest pain or swelling. He has not smoked since March 15th. He states that he hasnt been drinking as much alcohol or caffeine. States that he checks his BP at work. Notices that some of his readings might be high due to stress. He is also not always resting 5 min before checking. He is still having trouble sleeping due to shoulder pain. States he doesn't have time to exercise.  Current HTN meds: lisinopril 40/25mg  daily, amlodipine 10mg  daily, carvedilol 6.25mg  twice a day, propranolol (prn palpitations) BP goal: <130/80  Family History: The patient's family history  includes COPD in his maternal grandmother; Cancer in his father; Depression in his father; Diabetes in his father; Heart attack in his maternal grandfather and paternal grandfather; Prostate cancer in his father. There is no history of Colon cancer, Colon polyps, Esophageal cancer, Rectal cancer, or Stomach cancer.  Social History: current smoker 1 ppd, + alcohol (1-2 drinks per day, some days none), smokes CBD  Diet: breakfast: orange fruit smoothie and BLT Venison, fish  Exercise: yard work, not much recently due to shoulder surgery  Home BP readings: 07/18/20.....105/70/73.....10am 07/18/20...Marland KitchenMarland Kitchen154/85/86...Marland KitchenMarland Kitchen4pm 07/19/20.....139/78/72...Marland KitchenMarland Kitchen8am 07/19/20.....116/71/82....Marland Kitchennoon 07/19/20...Marland KitchenMarland Kitchen144/85/78...Marland KitchenMarland Kitchen4pm 07/20/20.....127/77/74...Marland KitchenMarland Kitchen930am 07/20/20.....150/80/90.....130pm 07/20/20.....139/82/86.[name]...415pm 07/23/20.....142/76/71.Marland KitchenMarland KitchenMarland KitchenMarland Kitchen415pm   Wt Readings from Last 3 Encounters:  06/25/20 220 lb (99.8 kg)  05/21/20 218 lb (98.9 kg)  05/02/20 217 lb (98.4 kg)   BP Readings from Last 3 Encounters:  06/20/20 130/76  06/05/20 138/62  05/21/20 (!) 142/80   Pulse Readings from Last 3 Encounters:  06/20/20 78  06/05/20 89  05/21/20 99    Renal function: CrCl cannot be calculated (Patient's most recent lab result is older than the maximum 21 days allowed.).  Past Medical History:  Diagnosis Date  . Allergy   . Arthritis   . Hyperlipidemia   . Hypertension   . OSA (obstructive sleep apnea)    Uses CPAP  . PVC's (premature ventricular contractions)     Current Outpatient Medications on File Prior to Visit  Medication Sig Dispense Refill  . amLODipine (NORVASC)  10 MG tablet Take 1 tablet (10 mg total) by mouth daily. 90 tablet 3  . buPROPion (WELLBUTRIN XL) 150 MG 24 hr tablet Take one tablet by mouth daily for 3 days then take 2 tablets by mouth daily 180 tablet 0  . calcium-vitamin D (OSCAL WITH D) 250-125 MG-UNIT tablet Take 1 tablet by mouth daily.    . carvedilol (COREG) 6.25 MG  tablet Take 1 tablet (6.25 mg total) by mouth 2 (two) times daily. 180 tablet 3  . celecoxib (CELEBREX) 200 MG capsule Take 200 mg by mouth 2 (two) times daily.    . Evolocumab (REPATHA SURECLICK) 841 MG/ML SOAJ Inject 140 mg into the skin every 14 (fourteen) days. 6 mL 1  . ezetimibe (ZETIA) 10 MG tablet Take 1 tablet (10 mg total) by mouth daily. 90 tablet 3  . levocetirizine (XYZAL) 5 MG tablet Take 5 mg by mouth every evening.    [name] lisinopril-hydrochlorothiazide (ZESTORETIC) 20-12.5 MG tablet Take 2 tablets by mouth daily. 90 tablet 3  . melatonin 5 MG TABS Take 5 mg by mouth at bedtime.    . mometasone (ELOCON) 0.1 % cream APPLY TO AFFECTED AREA EVERY DAY 45 g 0  . Multiple Vitamin (MULTIVITAMIN WITH MINERALS) TABS tablet Take 1 tablet by mouth daily.    . nicotine (NICODERM CQ - DOSED IN MG/24 HOURS) 14 mg/24hr patch Place 1 patch (14 mg total) onto the skin daily. 14 patch 0  . nicotine (NICODERM CQ - DOSED IN MG/24 HOURS) 21 mg/24hr patch Place 1 patch (21 mg total) onto the skin daily. 28 patch 0  . nicotine (NICODERM CQ - DOSED IN MG/24 HR) 7 mg/24hr patch Place 1 patch (7 mg total) onto the skin daily. 14 patch 0  . nitroGLYCERIN (NITROSTAT) 0.4 MG SL tablet Place 0.4 mg under the tongue every 5 (five) minutes as needed for chest pain.    [name] propranolol (INDERAL) 10 MG tablet Take 1 tablet (10 mg total) by mouth daily as needed (for palpitations). 30 tablet 1  . vitamin C (ASCORBIC ACID) 500 MG tablet Take 500 mg by mouth daily.     No current facility-administered medications on file prior to visit.    Allergies  Allergen Reactions  . Statins Other (See Comments)    Muscle aches    There were no vitals taken for this visit.   Assessment/Plan:  1. Hypertension - Blood pressure is just above goal of <130/80 in clinic today. Home readings vary with a few at goal and several higher. Will swap out HCTZ for chlorthalidone 12.5mg  daily to see if we can get better blood pressure  lowering. STOP taking lisinopril/HCTZ and START taking lisinopril 40mg  daily AND chlorthalidone 12.5 (1/2 tablet) daily. Continue amlodipine 10mg  daily and carvedilol 6.25mg  twice a day. I did encourage patient to start exercising. Discussed that the benefits of exercise are greater than any medication we can put him on. Follow up in 3 weeks. Appointment scheduled around his PT apt. He knows I will be out of the office and he will see another pharmacist. I called [facility] and left message to d/c lisinopril/HCTZ 40/25mg .   2. Tobacco abuse- Patient quite 3/15. Going well so far. Taking wellbutrin XL 300mg  daily and wearing a nicotine 21mg  patch daily. I congratulated him on his success.   Thank you  Ramond Dial, Pharm.D, BCPS, CPP [place]  [address], [place], [place] [address]  Phone: [phone]; Fax: [phone]

## 2020-07-24 NOTE — Patient Instructions (Addendum)
It was nice to see you today!  Please STOP taking lisinopril/HCTZ  START taking lisinopril 40mg  daily AND chlorthalidone 12.5 (1/2 tablet) daily  Continue amlodipine 10mg  daily and carvedilol 6.25mg  twice a day  Please continue checking your blood pressure once a day   Call me at [phone] with any questions

## 2020-07-26 NOTE — Telephone Encounter (Signed)
Per dr Radford Pax, Per dr Radford Pax, order cpap on auto from 4-15 cm H20 with heated humidity mask of choice with 6 week follow up.  Order placed to [facility].

## 2020-08-02 ENCOUNTER — Other Ambulatory Visit: Payer: Self-pay

## 2020-08-02 ENCOUNTER — Encounter: Payer: Self-pay | Admitting: Family Medicine

## 2020-08-02 ENCOUNTER — Ambulatory Visit (INDEPENDENT_AMBULATORY_CARE_PROVIDER_SITE_OTHER): Payer: Managed Care, Other (non HMO) | Admitting: Family Medicine

## 2020-08-02 VITALS — BP 133/68 | HR 70 | Temp 97.5°F | Ht 72.0 in | Wt 226.0 lb

## 2020-08-02 DIAGNOSIS — E7800 Pure hypercholesterolemia, unspecified: Secondary | ICD-10-CM | POA: Diagnosis not present

## 2020-08-02 DIAGNOSIS — Z23 Encounter for immunization: Secondary | ICD-10-CM

## 2020-08-02 DIAGNOSIS — F5101 Primary insomnia: Secondary | ICD-10-CM

## 2020-08-02 DIAGNOSIS — I10 Essential (primary) hypertension: Secondary | ICD-10-CM

## 2020-08-02 DIAGNOSIS — G4700 Insomnia, unspecified: Secondary | ICD-10-CM | POA: Insufficient documentation

## 2020-08-02 DIAGNOSIS — F17210 Nicotine dependence, cigarettes, uncomplicated: Secondary | ICD-10-CM | POA: Diagnosis not present

## 2020-08-02 MED ORDER — ZOLPIDEM TARTRATE 10 MG PO TABS: 10.0000 mg | ORAL_TABLET | Freq: Every evening | ORAL | 5 refills | Status: DC | PRN

## 2020-08-02 NOTE — Assessment & Plan Note (Signed)
Lab Results  Component Value Date   LDLCALC 26 06/22/2020  Is doing very well with combination of Repatha and Zetia.  LDL is at goal.  We will plan to continue.

## 2020-08-02 NOTE — Assessment & Plan Note (Signed)
Worsening since tarting CPAP.  I will going to add zolpidem back on.  Let me know if this is not helping with his insomnia.

## 2020-08-02 NOTE — Progress Notes (Signed)
Bryce King - 60 y.o. male MRN [id number]  Date of birth: [birth year]  Subjective Chief Complaint  Patient presents with  . Follow-up    HPI Bryce King 60 year old male here today for follow-up visit.  He reports that he is doing well.  His cardiologist is now managing his blood pressure medications.  This is well controlled at this time with current combination of amlodipine, carvedilol, lisinopril and chlorthalidone.  He has been able to remain quit from smoking for about the past month and a half.  He is using NicoDerm patches.  He is doing well with Zetia and Repatha for management of hyperlipidemia.  Recent LDL is at goal.  He has having some difficulty with sleep.  Still having some pain related to shoulder surgeries.  He had taken zolpidem 10 mg as needed at bedtime in the past this worked very well for him without side effects.  He would be interested in restarting this.  ROS:  A comprehensive ROS was completed and negative except as noted per HPI  Allergies  Allergen Reactions  . Statins Other (See Comments)    Muscle aches    Past Medical History:  Diagnosis Date  . Allergy   . Arthritis   . Hyperlipidemia   . Hypertension   . OSA (obstructive sleep apnea)    Uses CPAP  . PVC's (premature ventricular contractions)     Past Surgical History:  Procedure Laterality Date  . BICEPT TENODESIS Left 04/19/2020   Procedure: BICEPS TENODESIS;  Surgeon: Bryce Gash, MD;  Location: [facility];  Service: Orthopedics;  Laterality: Left;  . ELBOW SURGERY Right 2005  . REVERSE SHOULDER ARTHROPLASTY Right 08/10/2019   Procedure: REVERSE SHOULDER ARTHROPLASTY;  Surgeon: Bryce Gash, MD;  Location: [facility];  Service: Orthopedics;  Laterality: Right;  . ROTATOR CUFF REPAIR Right 2007  . SHOULDER ARTHROSCOPY WITH ROTATOR CUFF REPAIR AND SUBACROMIAL DECOMPRESSION Left 04/19/2020   Procedure: SHOULDER ARTHROSCOPY DEBRIDEMENT EXTENSIVE, DISTAL CLAVICULECTOMY  WITH ROTATOR CUFF REPAIR WITH BICEPS TENODESIS;  Surgeon: Bryce Gash, MD;  Location: [facility];  Service: Orthopedics;  Laterality: Left;    Social History   Socioeconomic History  . Marital status: Married    Spouse name: Not on file  . Number of children: 1  . Years of education: Not on file  . Highest education level: Some college, no degree  Occupational History  . Not on file  Tobacco Use  . Smoking status: Current Every Day Smoker    Packs/day: 1.00    Types: Cigarettes  . Smokeless tobacco: Never Used  Vaping Use  . Vaping Use: Never used  Substance and Sexual Activity  . Alcohol use: Yes    Comment: weekly, "07/21/18 2 drinks a week"   . Drug use: Never  . Sexual activity: Not on file  Other Topics Concern  . Not on file  Social History Narrative   Lives with wife   Caffeine- coffee 2 cups, tea all day   Social Determinants of Health   Financial Resource Strain: Not on file  Food Insecurity: Not on file  Transportation Needs: Not on file  Physical Activity: Not on file  Stress: Not on file  Social Connections: Not on file    Family History  Problem Relation Age of Onset  . Cancer Father   . Depression Father   . Diabetes Father   . Prostate cancer Father   . COPD Maternal Grandmother   .  Heart attack Maternal Grandfather   . Heart attack Paternal Grandfather   . Colon cancer Neg Hx   . Colon polyps Neg Hx   . Esophageal cancer Neg Hx   . Rectal cancer Neg Hx   . Stomach cancer Neg Hx     Health Maintenance  Topic Date Due  . Hepatitis C Screening  Never done  . COVID-19 Vaccine (4 - Booster for Pfizer series) 07/18/2020  . INFLUENZA VACCINE  10/29/2020  . COLONOSCOPY (Pts 45-59yrs Insurance coverage will need to be confirmed)  01/25/2023  . TETANUS/TDAP  08/03/2030  . HIV Screening  Completed  . HPV VACCINES  Aged Out      ----------------------------------------------------------------------------------------------------------------------------------------------------------------------------------------------------------------- Physical Exam BP 133/68 (BP Location: Left Arm, Patient Position: Sitting, Cuff Size: Large)   Pulse 70   Temp (!) 97.5 F (36.4 C)   Ht 6' (1.829 m)   Wt 226 lb (102.5 kg)   SpO2 98%   BMI 30.65 kg/m   Physical Exam  ------------------------------------------------------------------------------------------------------------------------------------------------------------------------------------------------------------------- Assessment and Plan  Essential hypertension [name] hypertension remains well controlled with current medications.  I recommend that he continue current medications.  He will continue to see cardiology for management of these however I am happy to help with this as needed as well.  Encouraged to remain quit from smoking and follow a low-sodium diet.  Nicotine dependence Congratulated on remaining quit for the past month and a half.  Encouraged to keep this up as he continues to wean from nicotine.  Hyperlipidemia Lab Results  Component Value Date   LDLCALC 26 06/22/2020  Is doing very well with combination of Repatha and Zetia.  LDL is at goal.  We will plan to continue.  Insomnia Worsening since tarting CPAP.  I will going to add zolpidem back on.  Let me know if this is not helping with his insomnia.   Meds ordered this encounter  Medications  . zolpidem (AMBIEN) 10 MG tablet    Sig: Take 1 tablet (10 mg total) by mouth at bedtime as needed for sleep.    Dispense:  30 tablet    Refill:  5    No follow-ups on file.    This visit occurred during the SARS-CoV-2 public health emergency.  Safety protocols were in place, including screening questions prior to the visit, additional usage of staff PPE, and extensive cleaning of exam room while  observing appropriate contact time as indicated for disinfecting solutions.

## 2020-08-02 NOTE — Patient Instructions (Signed)
Great to see you! Try restarting ambien 5-10mg  at bedtime.  See me again in 6 months.

## 2020-08-02 NOTE — Assessment & Plan Note (Signed)
Congratulated on remaining quit for the past month and a half.  Encouraged to keep this up as he continues to wean from nicotine.

## 2020-08-02 NOTE — Assessment & Plan Note (Addendum)
[name] hypertension remains well controlled with current medications.  I recommend that he continue current medications.  He will continue to see cardiology for management of these however I am happy to help with this as needed as well.  Encouraged to remain quit from smoking and follow a low-sodium diet.

## 2020-08-09 IMAGING — DX DG CHEST 2V
2 series · 2 of 2 positions shown · non-contrast
Comparison: None.

CLINICAL DATA: Chest pain, near syncope

EXAM:
CHEST - 2 VIEW

[chest pa]
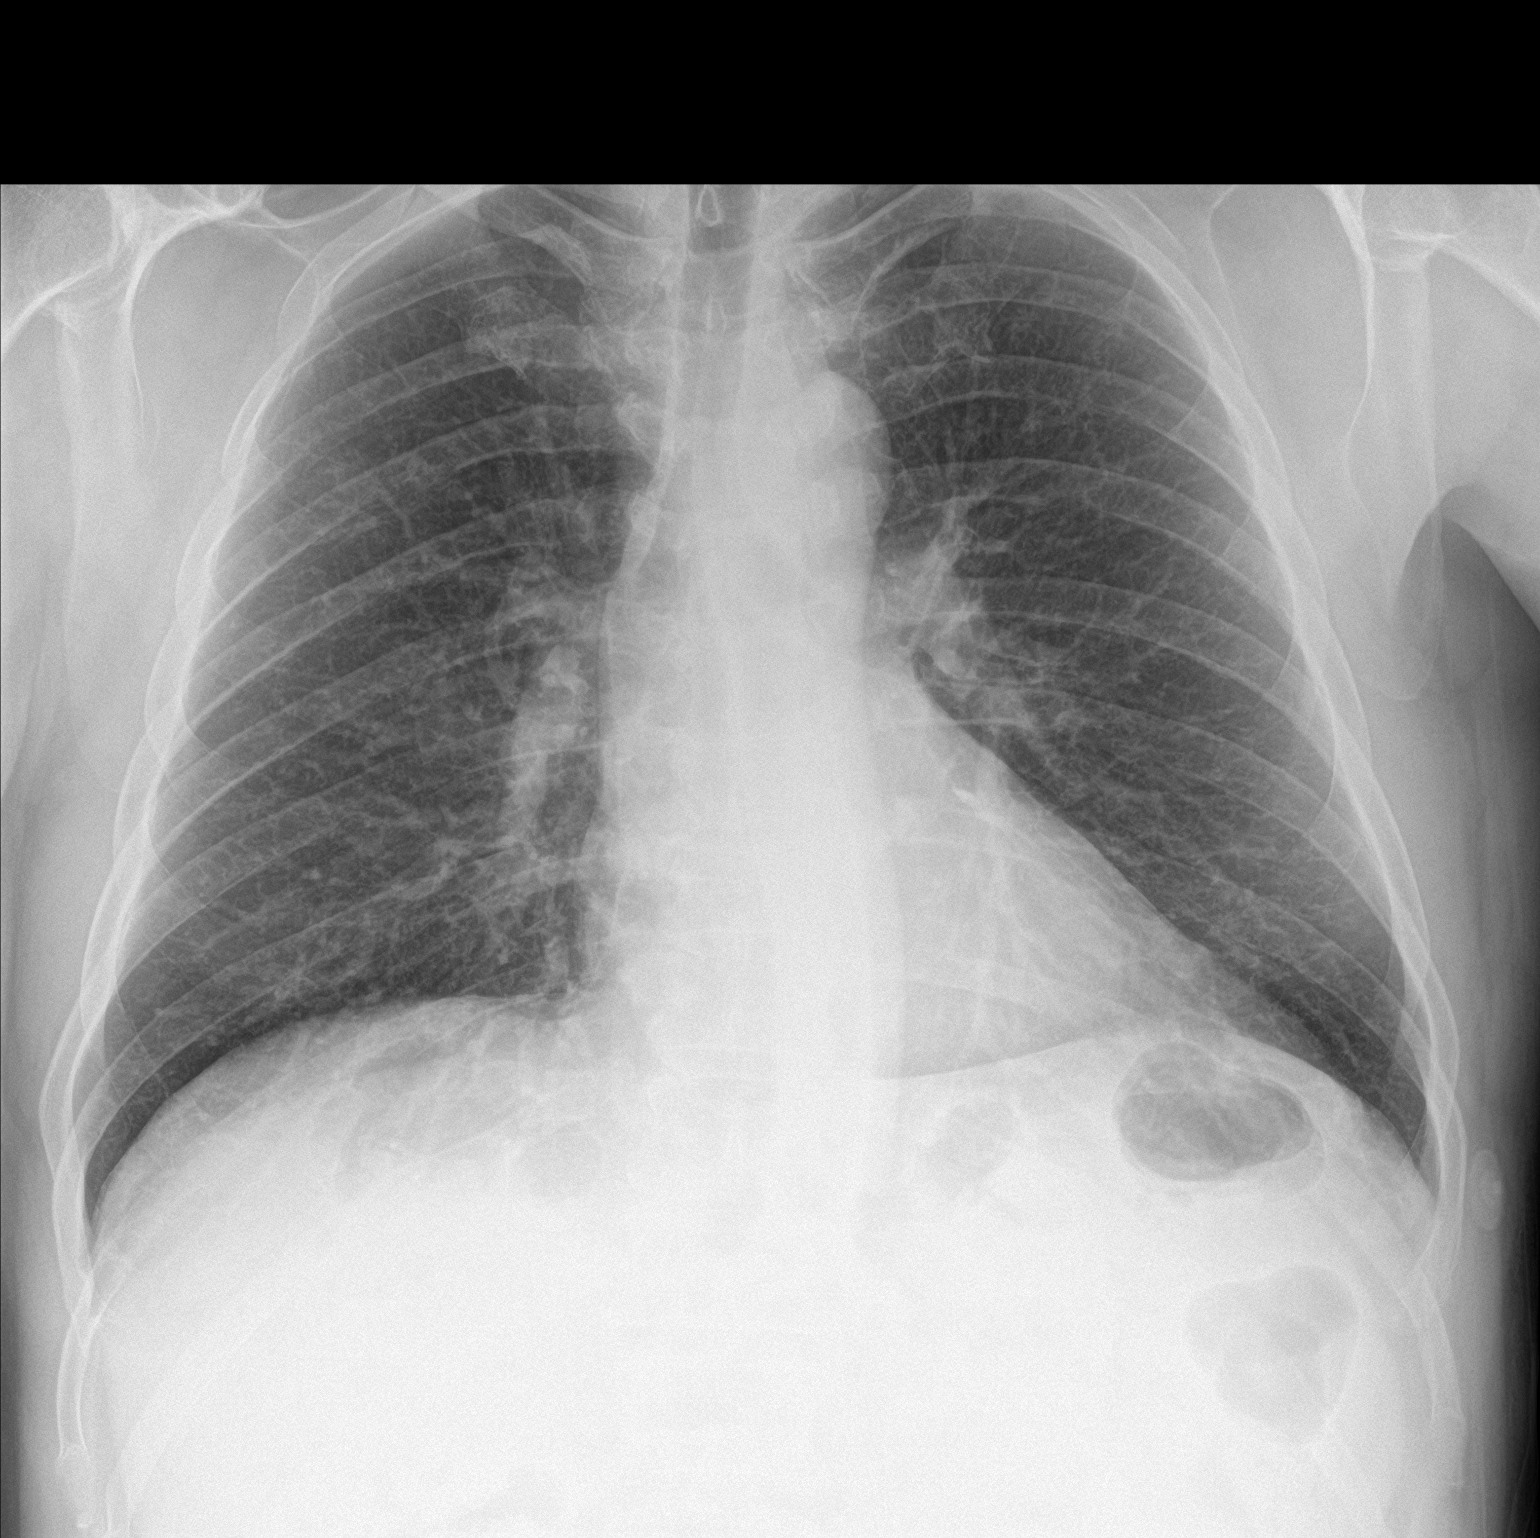

[chest lat]
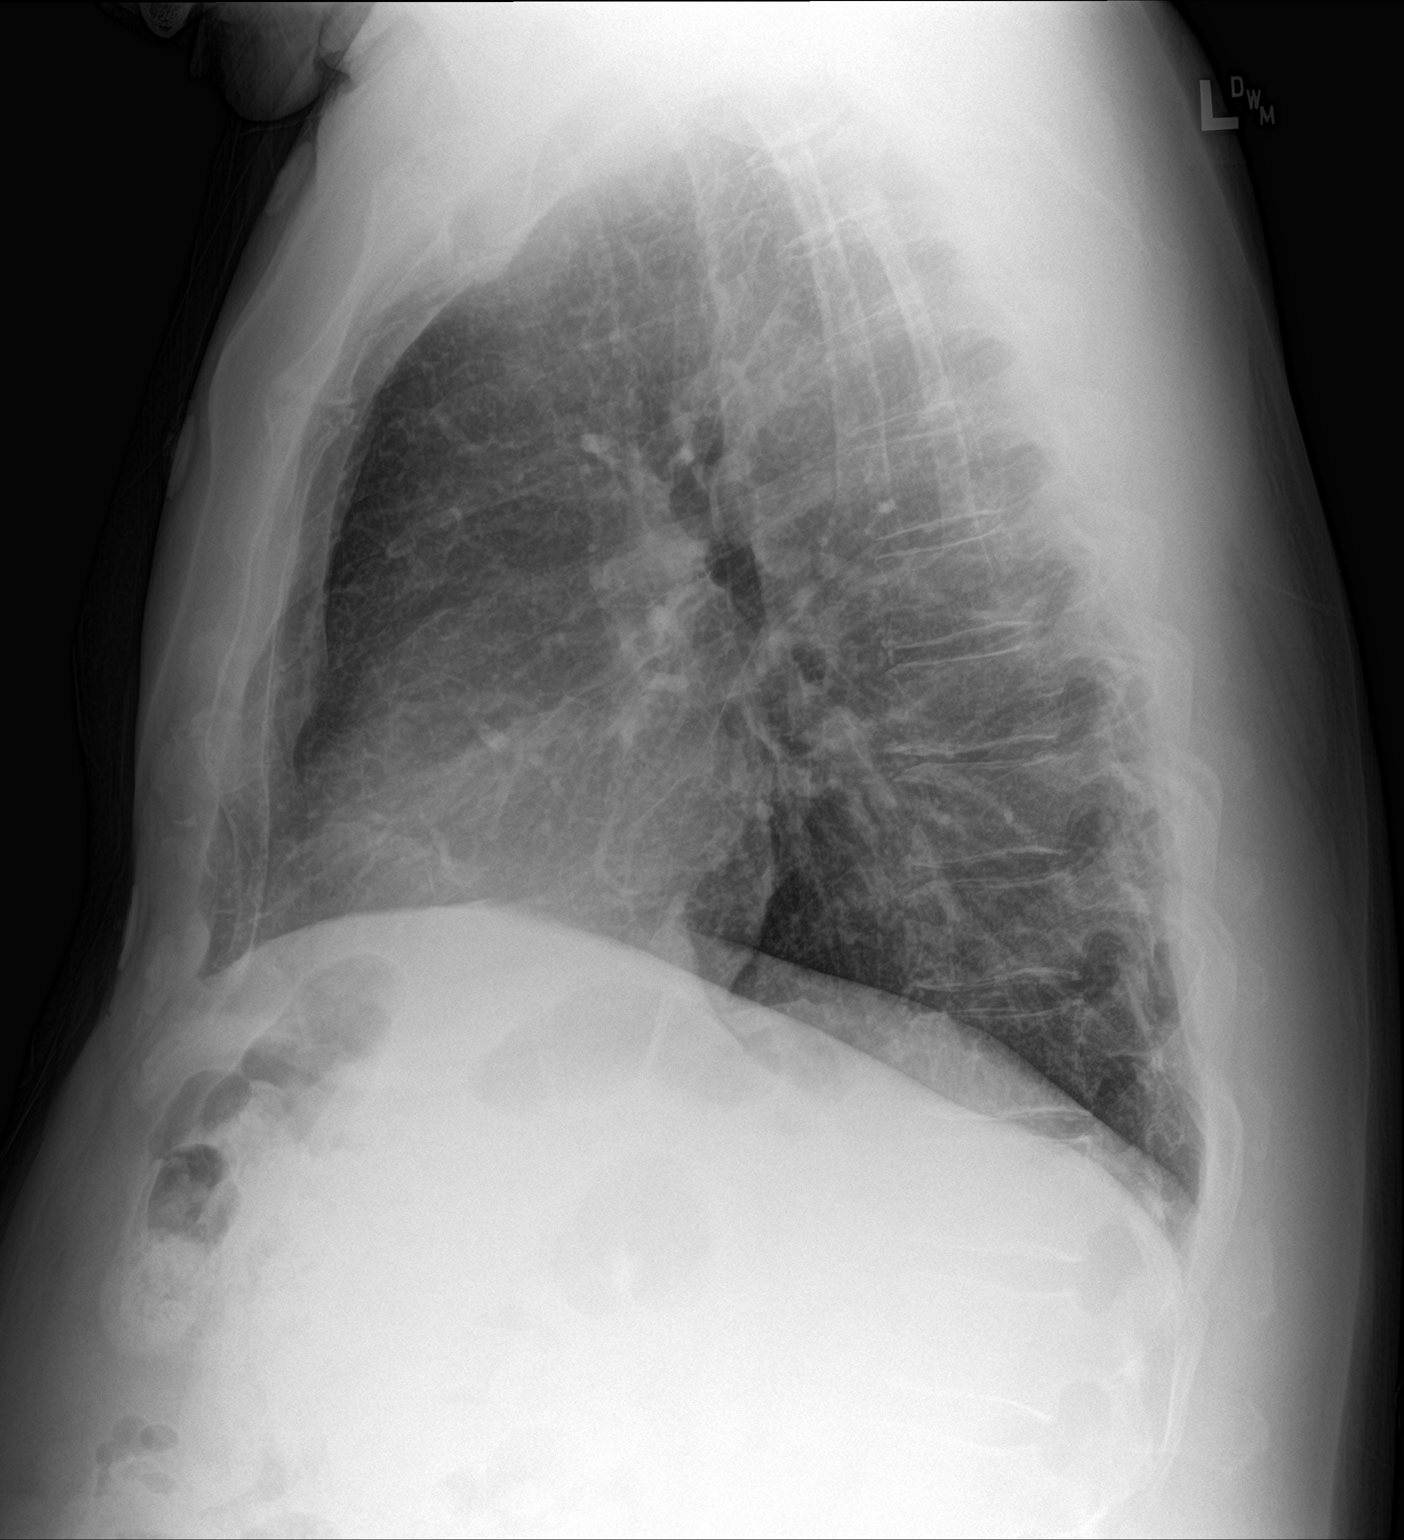

[2 of 2 positions shown; findings below may reference images not displayed]

FINDINGS: The heart size and mediastinal contours are within normal limits.
Both lungs are clear. The visualized skeletal structures are
unremarkable.
IMPRESSION: No acute abnormality of the lungs.  No focal airspace opacity.

## 2020-08-14 NOTE — Telephone Encounter (Signed)
Will forward to pharmD as patient has been more recently following with their team to manage. Thx!

## 2020-08-15 ENCOUNTER — Ambulatory Visit (INDEPENDENT_AMBULATORY_CARE_PROVIDER_SITE_OTHER): Payer: Managed Care, Other (non HMO) | Admitting: Pharmacist

## 2020-08-15 ENCOUNTER — Other Ambulatory Visit: Payer: Self-pay

## 2020-08-15 VITALS — BP 140/78 | HR 85

## 2020-08-15 DIAGNOSIS — I10 Essential (primary) hypertension: Secondary | ICD-10-CM

## 2020-08-15 MED ORDER — PROPRANOLOL HCL 10 MG PO TABS: ORAL_TABLET | ORAL | 1 refills | Status: DC

## 2020-08-15 NOTE — Progress Notes (Signed)
Patient ID: Bryce King                 DOB: 08/11/1960                      MRN: [id number]     HPI: Bryce King is a 60 y.o. male referred by [name] to HTN clinic. PMH is significant forhypertension, hyperlipidemia (statin intolerant, managed by primary care),sleep apnea, PVCs, prior tobacco abuse.  Patient was seen Bryce King via telemedicine 05/02/20. His BP was elevated. He was asked to check at home and send in readings. BP was still up so amlodipine was increased to 10mg  daily.   Patient was seen in HTN clinic on 06/05/20. His BP was 138/62. HR at home was in the 90's and patient reported feeling like his heart was racing sometimes. Carvedilol 3.125mg  BID was started. He was also prescribed Wellbutrin for smoking cessation and encouraged to choose a quite date.  At last visit on 06/20/20 patients blood pressure was 130/76 in clinic. Reports blood pressures in the 130's/70 at home. Carvedilol was increased to 6.25mg  twice a day and he was encouraged to increase his aerobic exercise. He also reported quitting smoking with wellbutrin XL and nicotine patch.  Patient presents today for recheck.  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  Receives calls and messages 24 hours a day.  Reports he has been feeling stress at work and home due to this and then feels heart palpitations.  Has an old Rx for propranolol for this indication but has not taken any.  While in waiting room stress level had gone up due to messages.  Reports no side effects or issues from switch to chlorthalidone.    Patient sent over home BP readings via myChart:  08/06/20...Marland KitchenMarland Kitchen1115am.....127/80/78 08/08/20...Marland KitchenMarland Kitchen1000am......124/79/76 08/10/20.....0930am.....116/71/76 08/13/20.....1245pm...Marland KitchenMarland Kitchen143/82/88 08/13/20...Marland KitchenMarland Kitchen145pm.....131/77/88 08/14/20...Marland KitchenMarland Kitchen1100am.....133/72/81   Current HTN meds: amlodipine 10mg  daily, carvedilol 6.25mg  BID, lisinopril 40mg  daily, propranolol 10mg  as needed for  anxiety Previously tried: lisinopril/hctz BP goal: <130/80  Wt Readings from Last 3 Encounters:  08/02/20 226 lb (102.5 kg)  06/25/20 220 lb (99.8 kg)  05/21/20 218 lb (98.9 kg)   BP Readings from Last 3 Encounters:  08/02/20 133/68  07/24/20 136/70  06/20/20 130/76   Pulse Readings from Last 3 Encounters:  08/02/20 70  07/24/20 66  06/20/20 78    Renal function: CrCl cannot be calculated (Patient's most recent lab result is older than the maximum 21 days allowed.).  Past Medical History:  Diagnosis Date  . Allergy   . Arthritis   . Hyperlipidemia   . Hypertension   . OSA (obstructive sleep apnea)    Uses CPAP  . PVC's (premature ventricular contractions)     Current Outpatient Medications on File Prior to Visit  Medication Sig Dispense Refill  . amLODipine (NORVASC) 10 MG tablet Take 1 tablet (10 mg total) by mouth daily. 90 tablet 3  . buPROPion (WELLBUTRIN XL) 150 MG 24 hr tablet Take one tablet by mouth daily for 3 days then take 2 tablets by mouth daily 180 tablet 0  . calcium-vitamin D (OSCAL WITH D) 250-125 MG-UNIT tablet Take 1 tablet by mouth daily.    . carvedilol (COREG) 6.25 MG tablet Take 1 tablet (6.25 mg total) by mouth 2 (two) times daily. 180 tablet 3  . celecoxib (CELEBREX) 200 MG capsule Take 200 mg by mouth 2 (two) times daily.    . chlorthalidone (HYGROTON) 25 MG tablet Take 0.5  tablets (12.5 mg total) by mouth daily. 45 tablet 3  . Evolocumab (REPATHA SURECLICK) 269 MG/ML SOAJ Inject 140 mg into the skin every 14 (fourteen) days. 6 mL 1  . ezetimibe (ZETIA) 10 MG tablet Take 1 tablet (10 mg total) by mouth daily. 90 tablet 3  . levocetirizine (XYZAL) 5 MG tablet Take 5 mg by mouth every evening.    [name] lisinopril (ZESTRIL) 40 MG tablet Take 1 tablet (40 mg total) by mouth daily. 90 tablet 3  . melatonin 5 MG TABS Take 5 mg by mouth at bedtime.    . mometasone (ELOCON) 0.1 % cream APPLY TO AFFECTED AREA EVERY DAY 45 g 0  . Multiple Vitamin  (MULTIVITAMIN WITH MINERALS) TABS tablet Take 1 tablet by mouth daily.    . nicotine (NICODERM CQ - DOSED IN MG/24 HOURS) 14 mg/24hr patch Place 1 patch (14 mg total) onto the skin daily. 14 patch 0  . nicotine (NICODERM CQ - DOSED IN MG/24 HOURS) 21 mg/24hr patch Place 1 patch (21 mg total) onto the skin daily. 28 patch 0  . nicotine (NICODERM CQ - DOSED IN MG/24 HR) 7 mg/24hr patch Place 1 patch (7 mg total) onto the skin daily. 14 patch 0  . nitroGLYCERIN (NITROSTAT) 0.4 MG SL tablet Place 0.4 mg under the tongue every 5 (five) minutes as needed for chest pain.    [name] propranolol (INDERAL) 10 MG tablet Take 1 tablet (10 mg total) by mouth daily as needed (for palpitations). 30 tablet 1  . vitamin C (ASCORBIC ACID) 500 MG tablet Take 500 mg by mouth daily.    [name] zolpidem (AMBIEN) 10 MG tablet Take 1 tablet (10 mg total) by mouth at bedtime as needed for sleep. 30 tablet 5   No current facility-administered medications on file prior to visit.    Allergies  Allergen Reactions  . Statins Other (See Comments)    Muscle aches     Assessment/Plan:  1. Hypertension - Patient BP in room 140/78 which is above goal of <130/80.  However home BP was 118/71 this morning.  Patient believes elevated BP is due to stress.  Many of his home BP readings are at or near goal as well.  Patient has not taken his propranolol recently for stress. Discussed he can try a tablet prn during periods of stress to see if it relieves symptoms. Cautioned that he is already on a maintenance beta blocker so to be cautious.  However his propranolol dosage is 10mg  so should not likely have too dramatic effect on heart rate or BP.    Patient last BMP January 2022.  Recommended he update while in clinic to check renal function and electrolytes since switching to chlorthalidone.  Patient agreeable.  Since home BP measurements near or at goal, will not make any med changes at this time.  However, advised patient to continue to  check BP at home and if it increses to <130/80 consistently, will likely increase chlorthalidone.  Patient will send readings over myChart.  Continue amlodipine 10mg  daily Continue chlorthalidone 12.5mg  daily Continue lisinopril 40mg  daily Continue carvedilol 6.25mg  BID Restart propranolol 10mg  prn anxiety Check BMP today Recheck as needed  Karren Cobble, PharmD, BCACP, [place], [place] [address] [place], [place] [address] Phone: [phone]; Fax: [phone] 08/15/2020 2:27 PM

## 2020-08-15 NOTE — Patient Instructions (Addendum)
It was nice seeing you today  We would like your blood pressure to be less than 130/80  Continue checking your blood pressure at home and if it is consistently above 130/80 let us know and we can increase the chlorthalidone  Continue your amlodipine 10mg  daily Continue chlorthalidone 12.5 mg daily Continue carvedilol 6.25mg  twice a day Continue lisinopril 40mg  daily  If you feel stress/anxiety with palpitations at work, you can take a 10mg  propranolol  We will check your lab work today  Please send [place] your blood pressure readings over myChart and we can decide if you need to be seen for a recheck  Please call with any questions  Karren Cobble, PharmD, BCACP, [place], [place] [address] [place], [place] [address] Phone: [phone]; Fax: [phone] 08/15/2020 1:49 PM

## 2020-08-16 LAB — BASIC METABOLIC PANEL
BUN/Creatinine Ratio: 14 (ref 9–20)
BUN: 13 mg/dL (ref 6–24)
CO2: 24 mmol/L (ref 20–29)
Calcium: 9.2 mg/dL (ref 8.7–10.2)
Chloride: 95 mmol/L — ABNORMAL LOW (ref 96–106)
Creatinine, Ser: 0.93 mg/dL (ref 0.76–1.27)
Glucose: 115 mg/dL — ABNORMAL HIGH (ref 65–99)
Potassium: 3.9 mmol/L (ref 3.5–5.2)
Sodium: 135 mmol/L (ref 134–144)
eGFR: 95 mL/min/{1.73_m2} (ref >59)

## 2020-09-05 ENCOUNTER — Encounter (HOSPITAL_BASED_OUTPATIENT_CLINIC_OR_DEPARTMENT_OTHER): Payer: Managed Care, Other (non HMO) | Admitting: Cardiology

## 2020-09-25 ENCOUNTER — Other Ambulatory Visit: Payer: Self-pay | Admitting: *Deleted

## 2020-09-25 ENCOUNTER — Other Ambulatory Visit: Payer: Self-pay

## 2020-09-25 MED ORDER — LISINOPRIL 40 MG PO TABS: 40.0000 mg | ORAL_TABLET | Freq: Every day | ORAL | 2 refills | Status: DC

## 2020-09-25 MED ORDER — CARVEDILOL 6.25 MG PO TABS: 6.2500 mg | ORAL_TABLET | Freq: Two times a day (BID) | ORAL | 2 refills | Status: DC

## 2020-09-25 MED ORDER — AMLODIPINE BESYLATE 10 MG PO TABS: 10.0000 mg | ORAL_TABLET | Freq: Every day | ORAL | 2 refills | Status: DC

## 2020-09-25 MED ORDER — CHLORTHALIDONE 25 MG PO TABS: 12.5000 mg | ORAL_TABLET | Freq: Every day | ORAL | 2 refills | Status: DC

## 2020-09-28 ENCOUNTER — Other Ambulatory Visit: Payer: Self-pay | Admitting: Family Medicine

## 2020-09-28 MED ORDER — ZOLPIDEM TARTRATE 10 MG PO TABS: 10.0000 mg | ORAL_TABLET | Freq: Every evening | ORAL | 1 refills | Status: DC | PRN

## 2020-09-28 MED ORDER — EZETIMIBE 10 MG PO TABS: 10.0000 mg | ORAL_TABLET | Freq: Every day | ORAL | 3 refills | Status: DC

## 2020-11-21 NOTE — Telephone Encounter (Signed)
Prior Authorization for titration sent to [facility] via web portal. Reference Number [id number], VALID DATES 11/16/20- 05/15/21.

## 2020-12-04 ENCOUNTER — Encounter: Payer: Self-pay | Admitting: Cardiology

## 2020-12-04 ENCOUNTER — Other Ambulatory Visit: Payer: Self-pay

## 2020-12-04 ENCOUNTER — Telehealth (INDEPENDENT_AMBULATORY_CARE_PROVIDER_SITE_OTHER): Payer: Managed Care, Other (non HMO) | Admitting: Cardiology

## 2020-12-04 VITALS — BP 129/82 | HR 79 | Ht 72.0 in | Wt 220.0 lb

## 2020-12-04 DIAGNOSIS — I493 Ventricular premature depolarization: Secondary | ICD-10-CM | POA: Diagnosis not present

## 2020-12-04 DIAGNOSIS — G4733 Obstructive sleep apnea (adult) (pediatric): Secondary | ICD-10-CM

## 2020-12-04 DIAGNOSIS — I10 Essential (primary) hypertension: Secondary | ICD-10-CM | POA: Diagnosis not present

## 2020-12-04 NOTE — Patient Instructions (Signed)
Medication Instructions:  Your physician recommends that you continue on your current medications as directed. Please refer to the Current Medication list given to you today.  *If you need a refill on your cardiac medications before your next appointment, please call your pharmacy*   Follow-Up: At [facility], you and your health needs are our priority.  As part of our continuing mission to provide you with exceptional heart care, we have created designated Provider Care Teams.  These Care Teams include your primary Cardiologist (physician) and Advanced Practice Providers (APPs -  Physician Assistants and Nurse Practitioners) who all work together to provide you with the care you need, when you need it.   Your next appointment:   1 year(s)  The format for your next appointment:   In Person  Provider:   Fransico Him, MD   Other Instructions Dr. Radford Pax has ordered you a new chin strap.

## 2020-12-04 NOTE — Progress Notes (Signed)
Virtual Visit via Video Note   This visit type was conducted due to national recommendations for restrictions regarding the COVID-19 Pandemic (e.g. social distancing) in an effort to limit this patient's exposure and mitigate transmission in our community.  Due to his co-morbid illnesses, this patient is at least at moderate risk for complications without adequate follow up.  This format is felt to be most appropriate for this patient at this time.  All issues noted in this document were discussed and addressed.  A limited physical exam was performed with this format.  Please refer to the patient's chart for his consent to telehealth for [facility].  The patient was identified using 2 identifiers.  Date:  12/04/2020   ID:  Bryce King, DOB Feb 22, 1961, MRN [id number]  Patient Location: Home Provider Location: Office  PCP:  Luetta Nutting, DO  Cardiologist:  Lauree Chandler, MD  Electrophysiologist:  None   Evaluation Performed: OSA   History of Present Illness:    Bryce King is a 60 y.o. male with hypertension, hyperlipidemia (statin intolerant, managed by primary care), sleep apnea, PVCs, prior tobacco abuse.  He has a hx of OSA and has been using CPAP for over a decade.  When I last saw him his device was old and had not had any sleep study done in years.  He underwent home sleep study showing severe OSA with an AHI of 34.6/hr and he was placed on auto CPAP from 4-15cm H2O.    He is doing well with his CPAP device and thinks that he has gotten used to it.  He tolerates the mask and feels the pressure is adequate.  Since going on CPAP he feels rested in the am and has no significant daytime sleepiness.  He denies any nasal dryness or nasal congestion.  He has some mouth dryness but has not had a chin strap that he used to have.  He does not think that he snores.     Past Medical History:  Diagnosis Date   Allergy    Arthritis    Hyperlipidemia    Hypertension     OSA (obstructive sleep apnea)    Uses CPAP   PVC's (premature ventricular contractions)    Past Surgical History:  Procedure Laterality Date   BICEPT TENODESIS Left 04/19/2020   Procedure: BICEPS TENODESIS;  Surgeon: Hiram Gash, MD;  Location: [place];  Service: Orthopedics;  Laterality: Left;   ELBOW SURGERY Right 2005   REVERSE SHOULDER ARTHROPLASTY Right 08/10/2019   Procedure: REVERSE SHOULDER ARTHROPLASTY;  Surgeon: Hiram Gash, MD;  Location: [facility];  Service: Orthopedics;  Laterality: Right;   ROTATOR CUFF REPAIR Right 2007   SHOULDER ARTHROSCOPY WITH ROTATOR CUFF REPAIR AND SUBACROMIAL DECOMPRESSION Left 04/19/2020   Procedure: SHOULDER ARTHROSCOPY DEBRIDEMENT EXTENSIVE, DISTAL CLAVICULECTOMY WITH ROTATOR CUFF REPAIR WITH BICEPS TENODESIS;  Surgeon: Hiram Gash, MD;  Location: [place];  Service: Orthopedics;  Laterality: Left;     Current Meds  Medication Sig   amLODipine (NORVASC) 10 MG tablet Take 1 tablet (10 mg total) by mouth daily.   buPROPion (WELLBUTRIN XL) 150 MG 24 hr tablet Take one tablet by mouth daily for 3 days then take 2 tablets by mouth daily   calcium-vitamin D (OSCAL WITH D) 250-125 MG-UNIT tablet Take 1 tablet by mouth daily.   carvedilol (COREG) 6.25 MG tablet Take 1 tablet (6.25 mg total) by mouth 2 (two) times daily.   celecoxib (CELEBREX) 200  MG capsule Take 200 mg by mouth 2 (two) times daily.   ezetimibe (ZETIA) 10 MG tablet Take 1 tablet (10 mg total) by mouth daily.   levocetirizine (XYZAL) 5 MG tablet Take 5 mg by mouth every evening.   lisinopril (ZESTRIL) 40 MG tablet Take 1 tablet (40 mg total) by mouth daily.   melatonin 5 MG TABS Take 5 mg by mouth at bedtime.   mometasone (ELOCON) 0.1 % cream APPLY TO AFFECTED AREA EVERY DAY   Multiple Vitamin (MULTIVITAMIN WITH MINERALS) TABS tablet Take 1 tablet by mouth daily.   nicotine (NICODERM CQ - DOSED IN MG/24 HOURS) 14 mg/24hr patch Place 1 patch (14 mg  total) onto the skin daily.   nicotine (NICODERM CQ - DOSED IN MG/24 HOURS) 21 mg/24hr patch Place 1 patch (21 mg total) onto the skin daily.   nicotine (NICODERM CQ - DOSED IN MG/24 HR) 7 mg/24hr patch Place 1 patch (7 mg total) onto the skin daily.   nitroGLYCERIN (NITROSTAT) 0.4 MG SL tablet Place 0.4 mg under the tongue every 5 (five) minutes as needed for chest pain.   propranolol (INDERAL) 10 MG tablet Take 1 tablet (10 mg total) by mouth as needed for palpitations   REPATHA SURECLICK XX123456 MG/ML SOAJ Inject 1 Syringe into the skin every 14 (fourteen) days.   vitamin C (ASCORBIC ACID) 500 MG tablet Take 500 mg by mouth daily.   [DISCONTINUED] chlorthalidone (HYGROTON) 25 MG tablet Take 0.5 tablets (12.5 mg total) by mouth daily.     Allergies:   Statins   Social History   Tobacco Use   Smoking status: Every Day    Packs/day: 1.00    Types: Cigarettes   Smokeless tobacco: Never  Vaping Use   Vaping Use: Never used  Substance Use Topics   Alcohol use: Yes    Comment: weekly, "07/21/18 2 drinks a week"    Drug use: Never     Family Hx: The patient's family history includes COPD in his maternal grandmother; Cancer in his father; Depression in his father; Diabetes in his father; Heart attack in his maternal grandfather and paternal grandfather; Prostate cancer in his father. There is no history of Colon cancer, Colon polyps, Esophageal cancer, Rectal cancer, or Stomach cancer.  ROS:   Please see the history of present illness.    All other systems reviewed and are negative.   Prior CV studies:   The following studies were reviewed today:  Home sleep study and PAP compliance download   Labs/Other Tests and Data Reviewed:    EKG:  NO EKG was reviewed today  Recent Labs: 02/03/2020: ALT 32 06/05/2020: TSH 1.010 08/15/2020: BUN 13; Creatinine, Ser 0.93; Potassium 3.9; Sodium 135   Recent Lipid Panel Lab Results  Component Value Date/Time   CHOL 88 06/22/2020 07:08 AM    TRIG 132 06/22/2020 07:08 AM   HDL 40 06/22/2020 07:08 AM   CHOLHDL 2.2 06/22/2020 07:08 AM   LDLCALC 26 06/22/2020 07:08 AM    Wt Readings from Last 3 Encounters:  12/04/20 220 lb (99.8 kg)  08/02/20 226 lb (102.5 kg)  06/25/20 220 lb (99.8 kg)     Objective:    Vital Signs:  BP 129/82   Pulse 79   Ht 6' (1.829 m)   Wt 220 lb (99.8 kg)   BMI 29.84 kg/m    Well nourished, well developed male in no acute distress. Well appearing, alert and conversant, regular work of breathing,  good skin color  Eyes- anicteric mouth- oral mucosa is pink  neuro- grossly intact skin- no apparent rash or lesions or cyanosis   ASSESSMENT & PLAN:    1. PVCs  -the denies any palpitations  2. Essential HTN -BP is well controlled on exam today -Continue prescription drug management with Amlodipine '10mg'$  daily and Lisinopril '40mg'$  daily with PRN refills  3. OSA  -The patient is tolerating PAP therapy well without any problems.   -The patient has been using and benefiting from PAP use and will continue to benefit from therapy.  -I will get a copy of the download from his DME. -he is having some mouth dryness so I will order a chin strap   Time:   Today, I have spent 15 minutes with the patient with telehealth technology discussing the above problems as well as reviewing home sleep study and PAP compliance download   Medication Adjustments/Labs and Tests Ordered: Current medicines are reviewed at length with the patient today.  Testing and concerns regarding medicines are outlined above.    Follow Up: Followup with me in 1 year  Signed, Fransico Him, MD  12/04/2020 10:02 AM    [employer]

## 2020-12-21 ENCOUNTER — Other Ambulatory Visit: Payer: Self-pay | Admitting: Family Medicine

## 2020-12-28 ENCOUNTER — Telehealth: Payer: Self-pay | Admitting: *Deleted

## 2020-12-28 NOTE — Telephone Encounter (Signed)
Order for chin strap submitted to [employer] via parachute portal.

## 2020-12-28 NOTE — Telephone Encounter (Signed)
-----   Message from Antonieta Iba, RN sent at 12/04/2020 10:06 AM EDT ----- Please order patient a new chin strap.  Thanks!

## 2021-02-08 ENCOUNTER — Telehealth: Payer: Self-pay

## 2021-02-08 NOTE — Telephone Encounter (Signed)
Medication: REPATHA SURECLICK 872 MG/ML SOAJ Prior authorization submitted via CoverMyMeds on 02/08/2021 PA submission pending

## 2021-03-15 ENCOUNTER — Other Ambulatory Visit: Payer: Self-pay

## 2021-03-15 MED ORDER — REPATHA SURECLICK 140 MG/ML ~~LOC~~ SOAJ: SUBCUTANEOUS | 1 refills | Status: DC

## 2021-05-24 ENCOUNTER — Encounter: Payer: Self-pay | Admitting: Family Medicine

## 2021-05-26 ENCOUNTER — Other Ambulatory Visit: Payer: Self-pay | Admitting: Family Medicine

## 2021-05-26 MED ORDER — REPATHA SURECLICK 140 MG/ML ~~LOC~~ SOAJ: SUBCUTANEOUS | 3 refills | Status: DC

## 2021-06-07 ENCOUNTER — Encounter: Payer: Self-pay | Admitting: Pharmacist

## 2021-06-10 MED ORDER — CHLORTHALIDONE 25 MG PO TABS: 12.5000 mg | ORAL_TABLET | Freq: Every day | ORAL | 3 refills | Status: DC

## 2021-06-10 NOTE — Telephone Encounter (Signed)
Discussed with patient. He has has a lot of stress at work and is not exercising. Back pain does limit his physical activity. He will try to add in some physical activity to help with BP and stress. I will see him in the office in 1 month for follow up. ?

## 2021-06-11 ENCOUNTER — Other Ambulatory Visit: Payer: Self-pay

## 2021-06-11 MED ORDER — REPATHA SURECLICK 140 MG/ML ~~LOC~~ SOAJ: SUBCUTANEOUS | 3 refills | Status: DC

## 2021-06-12 ENCOUNTER — Telehealth: Payer: Self-pay | Admitting: Family Medicine

## 2021-06-12 DIAGNOSIS — R7309 Other abnormal glucose: Secondary | ICD-10-CM

## 2021-06-12 DIAGNOSIS — Z0000 Encounter for general adult medical examination without abnormal findings: Secondary | ICD-10-CM

## 2021-06-12 DIAGNOSIS — E7800 Pure hypercholesterolemia, unspecified: Secondary | ICD-10-CM

## 2021-06-12 DIAGNOSIS — I10 Essential (primary) hypertension: Secondary | ICD-10-CM

## 2021-06-12 DIAGNOSIS — E785 Hyperlipidemia, unspecified: Secondary | ICD-10-CM

## 2021-06-12 NOTE — Telephone Encounter (Signed)
Patient called to get schedule for a general follow/check up appointment 4/17 @ 4:10. Patient would like to come in for blood work before to discuss results during appt. Please advise. ?

## 2021-06-12 NOTE — Telephone Encounter (Signed)
LVM advising pt that labs have been ordered and that he may have these drawn at his convenience.  Charyl Bigger, CMA ?

## 2021-06-23 ENCOUNTER — Other Ambulatory Visit: Payer: Self-pay | Admitting: Cardiology

## 2021-07-08 NOTE — Progress Notes (Signed)
Patient ID: Bryce King                 DOB: 08-Apr-1960                      MRN: [id number] ? ? ? ? ?HPI: ?Bryce King is a 61 y.o. male patient of Dr.Turner's/McAlhany's referred by [name], PA to HTN clinic. PMH is significant for hypertension, hyperlipidemia (statin intolerant, managed by primary care), sleep apnea (home CPAP), PVCs, prior tobacco abuse. ? ?Patient was seen by [facility] via telemedicine 05/02/20. His BP was elevated. He was asked to check at home and send in readings. BP was still up so amlodipine was increased to '10mg'$  daily.  ?  ?Patient was seen in HTN clinic on 06/05/20. His BP was 138/62. HR at home was in the 90's and patient reported feeling like his heart was racing sometimes. Carvedilol 3.'125mg'$  BID was started. He was also prescribed Wellbutrin for smoking cessation and encouraged to choose a quite date. At 06/20/20 visit, pt's BP was 130/76 in clinic. Reported blood pressures in the 130's/70 at home. Carvedilol was increased to 6.'25mg'$  twice a day and he was encouraged to increase his aerobic exercise. He also reported quitting smoking with wellbutrin XL and nicotine patch. ?  ?At previous visit patient brought his home meter with him. Found to be accurate. Clinic cuff: 138/62 Home cuff: 135/67.  ? ?On 07/24/20 BP in clinic 136/62. HCTZ was stopped, chlorthalidone 12.5 mg daily started. Last seen in clinic on 08/15/20, no changes were made at that time. In March 2023 patient reached out to PharmD with above goal blood pressures. He was scheduled to follow up in clinic. ? ?Patient presents to PharmD clinic in good spirits. Denies dizziness, balance, lightheadedness, headache, blurred vision, SOB, edema. Congratulated patient on one year of quitting cigarettes. Occasionally smokes CBD and vapes with nicotine cartidges - interested in restarting Wellbutrin as this helped him quit one year ago. BP in clinic at goal 120/62 despite reporting elevated BP readings in March. He says that he  doesn't rest for 5 minutes before taking home measurements. Exercise is limited by lower back pain - pt has a treadmill at home that he would like to start using more. Patient reports he has an appointment with his PCP next week to get a referral for PT. Endorses eating a lot of "junk" and adds salt and hot sauce to almost every meal. Anticipates he will eat better this spring/summer with the fresh vegetables he gets from his garden. ? ?Current HTN meds:  ?Amlodipine 10 mg daily ?Carvedilol 6.25 mg BID ?Chlorthalidone 12.5 mg daily ?Lisinopril 40 mg daily ? ?Previously tried: Lisinopril-HCTZ 40-25 mg daily ? ?BP goal: <130/80 mmHg ? ?Family History: COPD - maternal grandmother; Diabetes - father; Heart attack - maternal grandfather & paternal grandfather; Cancer - father; Depression - father; Prostate cancer - father ? ?Social History:  former smoker (previously 1ppd, quit March 2022), vape (nicotine), alcohol use (20 drinks/week), smokes CBD occasionally (twice/month) ? ?Diet: Breakfast BLT, country ham with biscuits, yogurt ?Lunch: sit down restaurant; sandwiches - varies; puts hot sauce on everything ([place] Radioactive Hot Sauce - 100 mg sodium/1 tsp) ?Adds salt to most meals - "until it tastes good" ? ?Exercise: limited by lower back pain ? ?Home BP readings: Systolic 621-30Q ?06/07/21: 141/80, 137/78, 139/80, 133/81, 135/83, 143/86 ? ?Wt Readings from Last 3 Encounters:  ?12/04/20 220 lb (99.8 kg)  ?08/02/20 226 lb (102.5 kg)  ?  06/25/20 220 lb (99.8 kg)  ? ?BP Readings from Last 3 Encounters:  ?07/09/21 120/62  ?12/04/20 129/82  ?08/15/20 140/78  ? ?Pulse Readings from Last 3 Encounters:  ?07/09/21 82  ?12/04/20 79  ?08/15/20 85  ? ? ?Renal function: ?CrCl cannot be calculated (Unknown ideal weight.). ? ?Past Medical History:  ?Diagnosis Date  ? Allergy   ? Arthritis   ? Hyperlipidemia   ? Hypertension   ? OSA (obstructive sleep apnea)   ? Uses CPAP  ? PVC's (premature ventricular contractions)   ? ? ?Current  Outpatient Medications on File Prior to Visit  ?Medication Sig Dispense Refill  ? amLODipine (NORVASC) 10 MG tablet Take 1 tablet (10 mg total) by mouth daily. 90 tablet 2  ? calcium-vitamin D (OSCAL WITH D) 250-125 MG-UNIT tablet Take 1 tablet by mouth daily.    ? carvedilol (COREG) 6.25 MG tablet Take 1 tablet (6.25 mg total) by mouth 2 (two) times daily. 180 tablet 2  ? celecoxib (CELEBREX) 200 MG capsule Take 200 mg by mouth 2 (two) times daily.    ? chlorthalidone (HYGROTON) 25 MG tablet TAKE ONE-HALF (1/2) TABLET DAILY 45 tablet 1  ? Evolocumab (REPATHA SURECLICK) 947 MG/ML SOAJ INJECT 140 MG INTO THE SKIN EVERY 14 DAYS 4 mL 3  ? ezetimibe (ZETIA) 10 MG tablet Take 1 tablet (10 mg total) by mouth daily. 90 tablet 3  ? levocetirizine (XYZAL) 5 MG tablet Take 5 mg by mouth every evening.    ? melatonin 5 MG TABS Take 5 mg by mouth at bedtime.    ? mometasone (ELOCON) 0.1 % cream APPLY TO AFFECTED AREA EVERY DAY 45 g 0  ? Multiple Vitamin (MULTIVITAMIN WITH MINERALS) TABS tablet Take 1 tablet by mouth daily.    ? nicotine (NICODERM CQ - DOSED IN MG/24 HOURS) 14 mg/24hr patch Place 1 patch (14 mg total) onto the skin daily. 14 patch 0  ? nicotine (NICODERM CQ - DOSED IN MG/24 HOURS) 21 mg/24hr patch Place 1 patch (21 mg total) onto the skin daily. 28 patch 0  ? nicotine (NICODERM CQ - DOSED IN MG/24 HR) 7 mg/24hr patch Place 1 patch (7 mg total) onto the skin daily. 14 patch 0  ? nitroGLYCERIN (NITROSTAT) 0.4 MG SL tablet Place 0.4 mg under the tongue every 5 (five) minutes as needed for chest pain.    ? propranolol (INDERAL) 10 MG tablet Take 1 tablet (10 mg total) by mouth as needed for palpitations 90 tablet 1  ? vitamin C (ASCORBIC ACID) 500 MG tablet Take 500 mg by mouth daily.    ? zolpidem (AMBIEN) 10 MG tablet Take 1 tablet (10 mg total) by mouth at bedtime as needed for sleep. 90 tablet 1  ? ?No current facility-administered medications on file prior to visit.  ? ? ?Allergies  ?Allergen Reactions  ?  Statins Other (See Comments)  ?  Muscle aches  ? ? ?Blood pressure 120/62, pulse 82, SpO2 97 %. ? ? ?Assessment/Plan: ? ?1. Hypertension - Blood pressure is below goal of <130/80 in clinic today. Home readings from March demonstrate BP above goal, but likely not measured appropriately as patient doesn't rest before readings.  Emphasized importance of exercise; patient agreed and said he hopes to walk more on his treadmill at home as his back pain improves with PT. Discussed how moving can often help with pain as well. Spoke about eating less processed meats and prepping meals (like grilled chicken) ahead of time for the  week. No medication changes today. Patient will call in a few weeks to report home BP readings and reinforced resting 5 minutes before taking readings. If above goal, can either titrate carvedilol to 12.5 mg BID or chlorthalidone 25 mg daily. Will follow-up in two weeks. ? ?2. Tobacco Cessation - Encouraged patient to stop vaping and patient expressed interest in quitting and requested a prescription for Wellbutrin. Counseled to start taking one week before wanting to quit. Sent prescription for Zyban and instructed to take one tablet once a day for 2 days then increase to twice daily.  ? ?Thank you, ? ?Laurey Arrow, PharmD ?PGY1 Pharmacy Resident ?[facility] [place], [place] [address]  ?Phone: [phone]; Fax: [phone]  ?

## 2021-07-09 ENCOUNTER — Ambulatory Visit (INDEPENDENT_AMBULATORY_CARE_PROVIDER_SITE_OTHER): Payer: Managed Care, Other (non HMO)

## 2021-07-09 VITALS — BP 120/62 | HR 82

## 2021-07-09 DIAGNOSIS — I10 Essential (primary) hypertension: Secondary | ICD-10-CM

## 2021-07-09 LAB — CBC WITH DIFFERENTIAL/PLATELET
Absolute Monocytes: 507 cells/uL (ref 200–950)
Basophils Absolute: 30 cells/uL (ref 0–200)
Basophils Relative: 0.7 %
Eosinophils Absolute: 39 cells/uL (ref 15–500)
Eosinophils Relative: 0.9 %
HCT: 41.7 % (ref 38.5–50.0)
Hemoglobin: 14.1 g/dL (ref 13.2–17.1)
Lymphs Abs: 839 cells/uL — ABNORMAL LOW (ref 850–3900)
MCH: 32.3 pg (ref 27.0–33.0)
MCHC: 33.8 g/dL (ref 32.0–36.0)
MCV: 95.6 fL (ref 80.0–100.0)
MPV: 9.6 fL (ref 7.5–12.5)
Monocytes Relative: 11.8 %
Neutro Abs: 2885 cells/uL (ref 1500–7800)
Neutrophils Relative %: 67.1 %
Platelets: 208 10*3/uL (ref 140–400)
RBC: 4.36 10*6/uL (ref 4.20–5.80)
RDW: 12.2 % (ref 11.0–15.0)
Total Lymphocyte: 19.5 %
WBC: 4.3 10*3/uL (ref 3.8–10.8)

## 2021-07-09 LAB — COMPLETE METABOLIC PANEL WITH GFR
AG Ratio: 1.8 (calc) (ref 1.0–2.5)
ALT: 31 U/L (ref 9–46)
AST: 21 U/L (ref 10–35)
Albumin: 4.4 g/dL (ref 3.6–5.1)
Alkaline phosphatase (APISO): 60 U/L (ref 35–144)
BUN: 12 mg/dL (ref 7–25)
CO2: 27 mmol/L (ref 20–32)
Calcium: 9.4 mg/dL (ref 8.6–10.3)
Chloride: 99 mmol/L (ref 98–110)
Creat: 0.73 mg/dL (ref 0.70–1.35)
Globulin: 2.5 g/dL (calc) (ref 1.9–3.7)
Glucose, Bld: 114 mg/dL — ABNORMAL HIGH (ref 65–99)
Potassium: 4.1 mmol/L (ref 3.5–5.3)
Sodium: 136 mmol/L (ref 135–146)
Total Bilirubin: 0.7 mg/dL (ref 0.2–1.2)
Total Protein: 6.9 g/dL (ref 6.1–8.1)
eGFR: 104 mL/min/{1.73_m2} (ref >=60)

## 2021-07-09 LAB — TSH: TSH: 0.9 mIU/L (ref 0.40–4.50)

## 2021-07-09 LAB — LIPID PANEL W/REFLEX DIRECT LDL
Cholesterol: 100 mg/dL (ref <200)
HDL: 47 mg/dL (ref >=40)
LDL Cholesterol (Calc): 37 mg/dL (calc)
Non-HDL Cholesterol (Calc): 53 mg/dL (calc) (ref <130)
Total CHOL/HDL Ratio: 2.1 (calc) (ref <5.0)
Triglycerides: 80 mg/dL (ref <150)

## 2021-07-09 LAB — HEMOGLOBIN A1C
Hgb A1c MFr Bld: 5.6 % of total Hgb (ref <5.7)
Mean Plasma Glucose: 114 mg/dL
eAG (mmol/L): 6.3 mmol/L

## 2021-07-09 MED ORDER — BUPROPION HCL ER (SMOKING DET) 150 MG PO TB12: ORAL_TABLET | ORAL | 1 refills | Status: DC

## 2021-07-09 MED ORDER — LISINOPRIL 40 MG PO TABS: 40.0000 mg | ORAL_TABLET | Freq: Every day | ORAL | 2 refills | Status: DC

## 2021-07-15 ENCOUNTER — Ambulatory Visit (INDEPENDENT_AMBULATORY_CARE_PROVIDER_SITE_OTHER): Payer: Managed Care, Other (non HMO)

## 2021-07-15 ENCOUNTER — Ambulatory Visit (INDEPENDENT_AMBULATORY_CARE_PROVIDER_SITE_OTHER): Payer: Managed Care, Other (non HMO) | Admitting: Family Medicine

## 2021-07-15 ENCOUNTER — Encounter: Payer: Self-pay | Admitting: Family Medicine

## 2021-07-15 VITALS — BP 129/63 | HR 64 | Ht 72.0 in | Wt 228.0 lb

## 2021-07-15 DIAGNOSIS — E7800 Pure hypercholesterolemia, unspecified: Secondary | ICD-10-CM

## 2021-07-15 DIAGNOSIS — I10 Essential (primary) hypertension: Secondary | ICD-10-CM

## 2021-07-15 DIAGNOSIS — F17210 Nicotine dependence, cigarettes, uncomplicated: Secondary | ICD-10-CM | POA: Diagnosis not present

## 2021-07-15 DIAGNOSIS — G8929 Other chronic pain: Secondary | ICD-10-CM

## 2021-07-15 DIAGNOSIS — M545 Low back pain, unspecified: Secondary | ICD-10-CM | POA: Diagnosis not present

## 2021-07-15 MED ORDER — ZOLPIDEM TARTRATE 10 MG PO TABS
5.0000 mg | ORAL_TABLET | Freq: Every evening | ORAL | 0 refills | Status: DC | PRN
Start: 2021-07-15 — End: 2022-02-05

## 2021-07-15 MED ORDER — MOMETASONE FUROATE 0.1 % EX CREA: TOPICAL_CREAM | CUTANEOUS | 0 refills | Status: DC

## 2021-07-15 NOTE — Progress Notes (Signed)
?Bryce King - 61 y.o. male MRN [id number]  Date of birth: 26-Aug-1960 ? ?Subjective ?Chief Complaint  ?Patient presents with  ? Results  ? ? ?HPI ?Bryce King is a 61 year old male here today for follow-up visit.  Reports overall he is doing well.  He is having some low back pain.  This is chronic and began after moving some furniture a few months ago.  Denies radiation of pain.  No numbness, tingling or weakness in the lower extremities. ? ?He does continue to use zolpidem as needed for insomnia.  This works pretty well for him.  He has not noted any significant side effects.  Typically only uses half a tab. ? ?Blood pressure remains well controlled with combination of carvedilol, chlorthalidone, lisinopril and amlodipine.  He does continue to use nicotine products.  He has not noted side effects from medications.  He denies chest pain, shortness of breath, palpitations, headaches or vision changes. ? ?He is doing well with Repatha and Zetia for management of hyperlipidemia.  He has been intolerant to statins in the past. ? ?ROS:  A comprehensive ROS was completed and negative except as noted per HPI ? ? ?Allergies  ?Allergen Reactions  ? Statins Other (See Comments)  ?  Muscle aches  ? ? ?Past Medical History:  ?Diagnosis Date  ? Allergy   ? Arthritis   ? Hyperlipidemia   ? Hypertension   ? OSA (obstructive sleep apnea)   ? Uses CPAP  ? PVC's (premature ventricular contractions)   ? ? ?Past Surgical History:  ?Procedure Laterality Date  ? BICEPT TENODESIS Left 04/19/2020  ? Procedure: BICEPS TENODESIS;  Surgeon: Hiram Gash, MD;  Location: [place];  Service: Orthopedics;  Laterality: Left;  ? ELBOW SURGERY Right 2005  ? REVERSE SHOULDER ARTHROPLASTY Right 08/10/2019  ? Procedure: REVERSE SHOULDER ARTHROPLASTY;  Surgeon: Hiram Gash, MD;  Location: [facility];  Service: Orthopedics;  Laterality: Right;  ? ROTATOR CUFF REPAIR Right 2007  ? SHOULDER ARTHROSCOPY WITH ROTATOR CUFF REPAIR AND SUBACROMIAL  DECOMPRESSION Left 04/19/2020  ? Procedure: SHOULDER ARTHROSCOPY DEBRIDEMENT EXTENSIVE, DISTAL CLAVICULECTOMY WITH ROTATOR CUFF REPAIR WITH BICEPS TENODESIS;  Surgeon: Hiram Gash, MD;  Location: [place];  Service: Orthopedics;  Laterality: Left;  ? ? ?Social History  ? ?Socioeconomic History  ? Marital status: Married  ?  Spouse name: Not on file  ? Number of children: 1  ? Years of education: Not on file  ? Highest education level: Some college, no degree  ?Occupational History  ? Not on file  ?Tobacco Use  ? Smoking status: Every Day  ?  Packs/day: 1.00  ?  Types: Cigarettes  ? Smokeless tobacco: Never  ?Vaping Use  ? Vaping Use: Never used  ?Substance and Sexual Activity  ? Alcohol use: Yes  ?  Comment: weekly, "07/21/18 2 drinks a week"   ? Drug use: Never  ? Sexual activity: Not on file  ?Other Topics Concern  ? Not on file  ?Social History Narrative  ? Lives with wife  ? Caffeine- coffee 2 cups, tea all day  ? ?Social Determinants of Health  ? ?Financial Resource Strain: Not on file  ?Food Insecurity: Not on file  ?Transportation Needs: Not on file  ?Physical Activity: Not on file  ?Stress: Not on file  ?Social Connections: Not on file  ? ? ?Family History  ?Problem Relation Age of Onset  ? Cancer Father   ? Depression Father   ?  Diabetes Father   ? Prostate cancer Father   ? COPD Maternal Grandmother   ? Heart attack Maternal Grandfather   ? Heart attack Paternal Grandfather   ? Colon cancer Neg Hx   ? Colon polyps Neg Hx   ? Esophageal cancer Neg Hx   ? Rectal cancer Neg Hx   ? Stomach cancer Neg Hx   ? ? ?Health Maintenance  ?Topic Date Due  ? Hepatitis C Screening  Never done  ? Zoster Vaccines- Shingrix (2 of 2) 11/23/2019  ? COVID-19 Vaccine (4 - Booster for [place] series) 03/14/2020  ? INFLUENZA VACCINE  10/29/2021  ? COLONOSCOPY (Pts 45-63yr Insurance coverage will need to be confirmed)  01/25/2023  ? TETANUS/TDAP  08/03/2030  ? HIV Screening  Completed  ? HPV VACCINES  Aged Out   ? ? ? ?----------------------------------------------------------------------------------------------------------------------------------------------------------------------------------------------------------------- ?Physical Exam ?BP 129/63 (BP Location: Left Arm, Patient Position: Sitting, Cuff Size: Normal)   Pulse 64   Ht 6' (1.829 m)   Wt 228 lb (103.4 kg)   SpO2 97%   BMI 30.92 kg/m?  ? ?Physical Exam ?Constitutional:   ?   Appearance: Normal appearance.  ?Eyes:  ?   General: No scleral icterus. ?Cardiovascular:  ?   Rate and Rhythm: Normal rate and regular rhythm.  ?Pulmonary:  ?   Effort: Pulmonary effort is normal.  ?   Breath sounds: Normal breath sounds.  ?Neurological:  ?   General: No focal deficit present.  ?   Mental Status: He is alert.  ?Psychiatric:     ?   Mood and Affect: Mood normal.     ?   Behavior: Behavior normal.  ? ? ?------------------------------------------------------------------------------------------------------------------------------------------------------------------------------------------------------------------- ?Assessment and Plan ? ?Essential hypertension ?Blood pressure is well controlled with current medications.  Recommend continuation of current strength.  Recent labs reviewed with him. ? ?Hyperlipidemia ?Lab Results  ?Component Value Date  ? LDLCALC 37 07/08/2021  ?Cholesterol is well controlled at this time.  Recommend continuation of Repatha and Zetia. ? ?Nicotine dependence ?Counseled on smoking cessation. ? ?Chronic midline low back pain without sciatica ?X-rays lumbar spine ordered today. ? ? ?Meds ordered this encounter  ?Medications  ? mometasone (ELOCON) 0.1 % cream  ?  Sig: APPLY TO AFFECTED AREA EVERY DAY  ?  Dispense:  45 g  ?  Refill:  0  ? zolpidem (AMBIEN) 10 MG tablet  ?  Sig: Take 0.5-1 tablets (5-10 mg total) by mouth at bedtime as needed for sleep.  ?  Dispense:  90 tablet  ?  Refill:  0  ? ? ?Return in about 6 months (around 01/14/2022) for  HTN. ? ? ? ?This visit occurred during the SARS-CoV-2 public health emergency.  Safety protocols were in place, including screening questions prior to the visit, additional usage of staff PPE, and extensive cleaning of exam room while observing appropriate contact time as indicated for disinfecting solutions.  ? ?

## 2021-07-15 NOTE — Assessment & Plan Note (Signed)
Blood pressure is well controlled with current medications.  Recommend continuation of current strength.  Recent labs reviewed with him. ?

## 2021-07-15 NOTE — Assessment & Plan Note (Signed)
Lab Results  ?Component Value Date  ? LDLCALC 37 07/08/2021  ?Cholesterol is well controlled at this time.  Recommend continuation of Repatha and Zetia. ?

## 2021-07-15 NOTE — Assessment & Plan Note (Signed)
X-rays lumbar spine ordered today. ?

## 2021-07-15 NOTE — Assessment & Plan Note (Signed)
Counseled on smoking cessation  

## 2021-07-19 ENCOUNTER — Other Ambulatory Visit: Payer: Self-pay | Admitting: Family Medicine

## 2021-07-19 MED ORDER — MELOXICAM 15 MG PO TABS: 15.0000 mg | ORAL_TABLET | Freq: Every day | ORAL | 0 refills | Status: DC

## 2021-07-23 ENCOUNTER — Telehealth: Payer: Self-pay

## 2021-07-23 NOTE — Telephone Encounter (Signed)
Reached out to check if patient has home readings as BP at 4/11 office visit was at goal of < 130/80 (120/62) but home readings in March were SBP 130-40s. Left voicemail. ?

## 2021-07-25 ENCOUNTER — Encounter: Payer: Self-pay | Admitting: Cardiovascular Disease

## 2021-07-25 DIAGNOSIS — I10 Essential (primary) hypertension: Secondary | ICD-10-CM

## 2021-07-25 MED ORDER — CHLORTHALIDONE 25 MG PO TABS: 25.0000 mg | ORAL_TABLET | Freq: Every day | ORAL | 1 refills | Status: DC

## 2021-07-26 NOTE — Telephone Encounter (Signed)
See my chart message

## 2021-08-10 ENCOUNTER — Encounter: Payer: Self-pay | Admitting: Family Medicine

## 2021-08-10 DIAGNOSIS — M545 Low back pain, unspecified: Secondary | ICD-10-CM

## 2021-08-12 ENCOUNTER — Other Ambulatory Visit: Payer: Self-pay

## 2021-08-12 ENCOUNTER — Other Ambulatory Visit: Payer: Self-pay | Admitting: Family Medicine

## 2021-08-12 MED ORDER — MELOXICAM 15 MG PO TABS: 15.0000 mg | ORAL_TABLET | Freq: Every day | ORAL | 0 refills | Status: DC | PRN

## 2021-08-22 ENCOUNTER — Other Ambulatory Visit: Payer: Managed Care, Other (non HMO)

## 2021-08-22 DIAGNOSIS — I10 Essential (primary) hypertension: Secondary | ICD-10-CM

## 2021-08-23 LAB — BASIC METABOLIC PANEL
BUN/Creatinine Ratio: 16 (ref 10–24)
BUN: 14 mg/dL (ref 8–27)
CO2: 25 mmol/L (ref 20–29)
Calcium: 9.5 mg/dL (ref 8.6–10.2)
Chloride: 101 mmol/L (ref 96–106)
Creatinine, Ser: 0.88 mg/dL (ref 0.76–1.27)
Glucose: 112 mg/dL — ABNORMAL HIGH (ref 70–99)
Potassium: 4 mmol/L (ref 3.5–5.2)
Sodium: 141 mmol/L (ref 134–144)
eGFR: 98 mL/min/{1.73_m2} (ref >59)

## 2021-09-02 ENCOUNTER — Ambulatory Visit: Payer: Managed Care, Other (non HMO) | Attending: Family Medicine | Admitting: Physical Therapy

## 2021-09-02 DIAGNOSIS — M545 Low back pain, unspecified: Secondary | ICD-10-CM | POA: Diagnosis not present

## 2021-09-02 DIAGNOSIS — M5459 Other low back pain: Secondary | ICD-10-CM | POA: Diagnosis present

## 2021-09-02 DIAGNOSIS — G8929 Other chronic pain: Secondary | ICD-10-CM | POA: Diagnosis not present

## 2021-09-02 DIAGNOSIS — R2689 Other abnormalities of gait and mobility: Secondary | ICD-10-CM | POA: Diagnosis present

## 2021-09-02 DIAGNOSIS — M6281 Muscle weakness (generalized): Secondary | ICD-10-CM | POA: Diagnosis present

## 2021-09-02 DIAGNOSIS — R293 Abnormal posture: Secondary | ICD-10-CM | POA: Diagnosis present

## 2021-09-02 NOTE — Patient Instructions (Signed)
Access Code: [email or web address] URL: [email or web address] Date: 09/02/2021 Prepared by: Estill Bamberg April Thurnell Garbe  Exercises - Bilateral Bent Leg Lift  - 1-2 x daily - 7 x weekly - 2 sets - 10 reps - Clamshell with Resistance  - 1-2 x daily - 7 x weekly - 2 sets - 10 reps - Seated Hip Flexor Stretch  - 1 x daily - 7 x weekly - 2 sets - 30 sec hold - Seated Hamstring Stretch  - 1 x daily - 7 x weekly - 2 sets - 30 sec hold

## 2021-09-02 NOTE — Therapy (Signed)
[place], [place], [address] Phone: [phone]   Fax:  [phone]  Physical Therapy Evaluation  Patient Details  Name: Bryce King MRN: [id number] Date of Birth: 01-25-1961 Referring Provider (PT): Luetta Nutting, DO  Rationale for Evaluation and Treatment Rehabilitation  Encounter Date: 09/02/2021   PT End of Session - 09/02/21 1540     Visit Number 1    Number of Visits 6    Date for PT Re-Evaluation 10/14/21    Authorization Type [employer]    PT Start Time 1450    PT Stop Time 1530    PT Time Calculation (min) 40 min             Past Medical History:  Diagnosis Date   Allergy    Arthritis    Hyperlipidemia    Hypertension    OSA (obstructive sleep apnea)    Uses CPAP   PVC's (premature ventricular contractions)     Past Surgical History:  Procedure Laterality Date   BICEPT TENODESIS Left 04/19/2020   Procedure: BICEPS TENODESIS;  Surgeon: Hiram Gash, MD;  Location: [place];  Service: Orthopedics;  Laterality: Left;   ELBOW SURGERY Right 2005   REVERSE SHOULDER ARTHROPLASTY Right 08/10/2019   Procedure: REVERSE SHOULDER ARTHROPLASTY;  Surgeon: Hiram Gash, MD;  [facility];  Service: Orthopedics;  Laterality: Right;   ROTATOR CUFF REPAIR Right 2007   SHOULDER ARTHROSCOPY WITH ROTATOR CUFF REPAIR AND SUBACROMIAL DECOMPRESSION Left 04/19/2020   Procedure: SHOULDER ARTHROSCOPY DEBRIDEMENT EXTENSIVE, DISTAL CLAVICULECTOMY WITH ROTATOR CUFF REPAIR WITH BICEPS TENODESIS;  Surgeon: Hiram Gash, MD;  Location: [place];  Service: Orthopedics;  Laterality: Left;    There were no vitals filed for this visit.    Subjective Assessment - 09/02/21 1452     Subjective Pt reports chronic LBP. Pt states he has a lot of property -- aggravated when he does activities at home/in the yard. Pt states stacking firewood. Pt works mostly at [employer].  Getting out of bed feels the worst -- the meloxicam has been helping a lot. As it wears off later in the day pain worsens.    Limitations Walking;Lifting;House hold activities    How long can you sit comfortably? n/a    How long can you stand comfortably? ~2 hours    How long can you walk comfortably? Walking in mall    Patient Stated Goals Perform home/yard activities with manageable pain    Currently in Pain? Yes    Pain Score 2    At worst 6/10   Pain Location Back    Pain Orientation Left    Pain Onset More than a month ago    Aggravating Factors  Bending, lifting    Pain Relieving Factors rest    Effect of Pain on Daily Activities Difficulty with home activities                [facility] PT Assessment - 09/02/21 0001       Assessment   Medical Diagnosis M54.50,G89.29 (ICD-10-CM) - Chronic midline low back pain without sciatica    Referring Provider (PT) Luetta Nutting, DO    Onset Date/Surgical Date --   6-8 months ago   Hand Dominance Right    Prior Therapy R Shoulder TSA      Precautions   Precautions None      Restrictions   Weight Bearing Restrictions No  Balance Screen   Has the patient fallen in the past 6 months No      [place] residence    Living Arrangements Spouse/significant other      Prior Function   Vocation Full time employment    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    Leisure Hunt/fish      Observation/Other Assessments   Focus on Therapeutic Outcomes ([place])  25; predicted 62      ROM / Strength   AROM / PROM / Strength Strength;AROM      AROM   AROM Assessment Site Lumbar    Lumbar Flexion WFL    Lumbar Extension WFL   increased pain   Lumbar - Right Side Bend WFL    Lumbar - Left Side Bend WFL    Lumbar - Right Rotation WFL    Lumbar - Left Rotation [facility]      Strength   Strength Assessment Site Hip    Right/Left Hip Right;Left    Right Hip Flexion 4+/5    Right Hip Extension  4+/5    Right Hip External Rotation  4+/5    Right Hip Internal Rotation 4+/5    Right Hip ABduction 4-/5    Right Hip ADduction 5/5    Left Hip Flexion 4+/5    Left Hip Extension 4+/5    Left Hip External Rotation 4+/5    Left Hip Internal Rotation 4+/5    Left Hip ABduction 3+/5    Left Hip ADduction 5/5      Flexibility   Soft Tissue Assessment /Muscle Length yes   Hip flexors tight bilat   Hamstrings L>R tightness      Palpation   Palpation comment Lumbar spring testing WFL; no overt tenderness to palpation along lumbar paraspinals      Special Tests    Special Tests Lumbar    Lumbar Tests Straight Leg Raise;Prone Knee Bend Test      Prone Knee Bend Test   Findings Negative      Straight Leg Raise   Findings Negative                        Objective measurements completed on examination: See above findings.                PT Education - 09/02/21 1540     Education Details Exam findings, POC, initial HEP    Person(s) Educated Patient    Methods Explanation;Demonstration;Tactile cues;Handout;Verbal cues    Comprehension Verbalized understanding;Returned demonstration;Verbal cues required;Tactile cues required                 PT Long Term Goals - 09/02/21 1546       PT LONG TERM GOAL #1   Title Pt will be independent with HEP    Time 6    Period Weeks    Status New    Target Date 10/14/21      PT LONG TERM GOAL #2   Title Pt will be able to perform side plank x 45 sec each side to demo improved hip abductor and core stability    Time 6    Period Weeks    Status New    Target Date 10/14/21      PT LONG TERM GOAL #3   Title Pt will be able to lift and carry 20# x10 with good form for decreased back pain  Time 6    Period Weeks    Status New    Target Date 10/14/21      PT LONG TERM GOAL #4   Title Pt will report >/=25% improvement with back pain during activities    Time 6    Period Weeks    Status New    Target  Date 10/14/21      PT LONG TERM GOAL #5   Title Pt will have improved FOTO score to >/=62    Time 6    Period Weeks    Status New    Target Date 10/14/21                    Plan - 09/02/21 1541     Clinical Impression Statement [name] is a 61 y/o M presenting to OPPT due to slow increase in LBP for the last 6-8 months. Pt has chronic LBP at baseline especially exacerbated with household and yard activities (bending, lifting, prolonged walking and standing, etc.). On assessment, pt demos core and hip abductor weakness with tight hip flexors and hamstrings likely leading to increased lumbar use with activities. Pt would benefit from PT to address these issues and improve body mechanics with lifting activities and posture.    Personal Factors and Comorbidities Age;Time since onset of injury/illness/exacerbation    Examination-Activity Limitations Lift;Locomotion Level;Carry    Examination-Participation Restrictions Community Activity;Yard Work    Stability/Clinical Decision Making Stable/Uncomplicated    Clinical Decision Making Low    Rehab Potential Good    PT Frequency 1x / week    PT Duration 6 weeks    PT Treatment/Interventions ADLs/Self Care Home Management;Electrical Stimulation;Cryotherapy;Iontophoresis '4mg'$ /ml Dexamethasone;Moist Heat;DME Instruction;Gait training;Stair training;Functional mobility training;Therapeutic activities;Therapeutic exercise;Balance training;Neuromuscular re-education;Manual techniques;Patient/family education;Taping;Dry needling;Passive range of motion    PT Next Visit Plan Assess response to HEP. Continue hip flexor and hamstring stretching. Strengthen core and hip abductors. Work on hip hinging and Midwife.    PT Home Exercise Plan Access Code: [email or web address]    Consulted and Agree with Plan of Care Patient             Patient will benefit from skilled therapeutic intervention in order to improve the following deficits and  impairments:  Decreased activity tolerance, Pain, Improper body mechanics, Decreased mobility, Decreased strength, Postural dysfunction, Decreased endurance  Visit Diagnosis: Muscle weakness (generalized)  Other low back pain  Abnormal posture  Other abnormalities of gait and mobility     Problem List Patient Active Problem List   Diagnosis Date Noted   Chronic midline low back pain without sciatica 07/15/2021   Insomnia 08/02/2020   Arthritis of right glenohumeral joint 08/10/2019   Well adult exam 06/06/2019   Right shoulder pain 06/06/2019   Onychomycosis 11/25/2018   Dizziness 06/02/2018   PVC's (premature ventricular contractions) 05/19/2018   Near syncope    Chest pressure 05/17/2018   Chest pain 05/17/2018   Essential hypertension 05/05/2018   Hyperlipidemia 05/05/2018   Elevated glucose 05/05/2018   Nicotine dependence 05/05/2018    Elisea Khader April Gordy Levan, PT, DPT 09/02/2021, 3:51 PM  [facility] [place] [facility] [place], [place], [address] Phone: [phone]   Fax:  [phone]  Name: Bryce King MRN: [id number] Date of Birth: 1960-11-11

## 2021-09-07 ENCOUNTER — Other Ambulatory Visit: Payer: Self-pay | Admitting: Cardiovascular Disease

## 2021-09-09 MED ORDER — EZETIMIBE 10 MG PO TABS: 10.0000 mg | ORAL_TABLET | Freq: Every day | ORAL | 3 refills | Status: DC

## 2021-09-09 NOTE — Addendum Note (Signed)
Addended by: Narda Rutherford on: 09/09/2021 08:12 AM   Modules accepted: Orders

## 2021-09-11 ENCOUNTER — Ambulatory Visit: Payer: Managed Care, Other (non HMO) | Admitting: Physical Therapy

## 2021-09-13 ENCOUNTER — Ambulatory Visit: Payer: Managed Care, Other (non HMO) | Admitting: Physical Therapy

## 2021-09-13 DIAGNOSIS — M5459 Other low back pain: Secondary | ICD-10-CM

## 2021-09-13 DIAGNOSIS — R293 Abnormal posture: Secondary | ICD-10-CM

## 2021-09-13 DIAGNOSIS — M6281 Muscle weakness (generalized): Secondary | ICD-10-CM

## 2021-09-13 DIAGNOSIS — R2689 Other abnormalities of gait and mobility: Secondary | ICD-10-CM

## 2021-09-13 NOTE — Therapy (Signed)
[place]ry[place] King, [place], [address] Phone: [phone]   Fax:  [phone]  Physical Therapy Treatment  Patient Details  Name: Bryce King MRN: [id number] Date of Birth: [birth year] Referring Provider (PT): Luetta Nutting, DO   Encounter Date: 09/13/2021   PT End of Session - 09/13/21 0755     Visit Number 2    Number of Visits 6    Date for PT Re-Evaluation 10/14/21    Authorization Type [employer]    PT Start Time 0800    PT Stop Time 0845    PT Time Calculation (min) 45 min    Activity Tolerance Patient tolerated treatment well    Behavior During Therapy [facility] for tasks assessed/performed             Past Medical History:  Diagnosis Date   Allergy    Arthritis    Hyperlipidemia    Hypertension    OSA (obstructive sleep apnea)    Uses CPAP   PVC's (premature ventricular contractions)     Past Surgical History:  Procedure Laterality Date   BICEPT TENODESIS Left 04/19/2020   Procedure: BICEPS TENODESIS;  Surgeon: Hiram Gash, MD;  Location: [place];  Service: Orthopedics;  Laterality: Left;   ELBOW SURGERY Right 2005   REVERSE SHOULDER ARTHROPLASTY Right 08/10/2019   Procedure: REVERSE SHOULDER ARTHROPLASTY;  Surgeon: Hiram Gash, MD;  [facility];  Service: Orthopedics;  Laterality: Right;   ROTATOR CUFF REPAIR Right 2007   SHOULDER ARTHROSCOPY WITH ROTATOR CUFF REPAIR AND SUBACROMIAL DECOMPRESSION Left 04/19/2020   Procedure: SHOULDER ARTHROSCOPY DEBRIDEMENT EXTENSIVE, DISTAL CLAVICULECTOMY WITH ROTATOR CUFF REPAIR WITH BICEPS TENODESIS;  Surgeon: Hiram Gash, MD;  Location: [place];  Service: Orthopedics;  Laterality: Left;    There were no vitals filed for this visit.   Subjective Assessment - 09/13/21 0801     Subjective Pt states he has been remiss at doing the exercises. Pt notes that last week he was out of town. Pt reports his back  has been okay. Took a meloxicam this morning.    Limitations Walking;Lifting;House hold activities    How long can you sit comfortably? n/a    How long can you stand comfortably? ~2 hours    How long can you walk comfortably? Walking in mall    Patient Stated Goals Perform home/yard activities with manageable pain    Currently in Pain? Yes    Pain Score 1     Pain Location Back    Pain Onset More than a month ago                [facility] PT Assessment - 09/13/21 0001       Assessment   Medical Diagnosis M54.50,G89.29 (ICD-10-CM) - Chronic midline low back pain without sciatica    Referring Provider (PT) Luetta Nutting, DO    Hand Dominance Right    Prior Therapy R Shoulder TSA      Precautions   Precautions None                           [facility] Adult PT Treatment/Exercise - 09/13/21 0001       Exercises   Exercises Lumbar      Lumbar Exercises: Stretches   Active Hamstring Stretch Left;Right;2 reps;30 seconds    Double Knee to Chest Stretch 30 seconds    Lower Trunk Rotation 10 seconds;4  reps    Hip Flexor Stretch Right;Left;2 reps;30 seconds      Lumbar Exercises: Aerobic   Tread Mill 2.5 mph x 5 min for warm up      Lumbar Exercises: Standing   Other Standing Lumbar Exercises Deadlift 10# KB 2x10      Lumbar Exercises: Seated   Sit to Stand 20 reps    Sit to Stand Limitations 15# KB      Lumbar Exercises: Supine   Other Supine Lumbar Exercises pball roll out with abset 2x10; marching feet on pball 2x10      Lumbar Exercises: Sidelying   Clam Right;Left;10 reps    Clam Limitations x3 sets green tband      Lumbar Exercises: Prone   Opposite Arm/Leg Raise Right arm/Left leg;Left arm/Right leg;10 reps                          PT Long Term Goals - 09/02/21 1546       PT LONG TERM GOAL #1   Title Pt will be independent with HEP    Time 6    Period Weeks    Status New    Target Date 10/14/21      PT LONG TERM GOAL #2    Title Pt will be able to perform side plank x 45 sec each side to demo improved hip abductor and core stability    Time 6    Period Weeks    Status New    Target Date 10/14/21      PT LONG TERM GOAL #3   Title Pt will be able to lift and carry 20# x10 with good form for decreased back pain    Time 6    Period Weeks    Status New    Target Date 10/14/21      PT LONG TERM GOAL #4   Title Pt will report >/=25% improvement with back pain during activities    Time 6    Period Weeks    Status New    Target Date 10/14/21      PT LONG TERM GOAL #5   Title Pt will have improved FOTO score to >/=62    Time 6    Period Weeks    Status New    Target Date 10/14/21                   Plan - 09/13/21 0240     Clinical Impression Statement Treatment focused on reviewing HEP. Continued to work on core and hip weakness and stretching hips. Trialed working on functional lifting -- pt has difficulty activating glutes to assist with deadlifts and squats. Mostly felt in quads during squat and only using low back with deadlifts.    Personal Factors and Comorbidities Age;Time since onset of injury/illness/exacerbation    Examination-Activity Limitations Lift;Locomotion Level;Carry    Examination-Participation Restrictions Community Activity;Yard Work    Stability/Clinical Decision Making Stable/Uncomplicated    Rehab Potential Good    PT Frequency 1x / week    PT Duration 6 weeks    PT Treatment/Interventions ADLs/Self Care Home Management;Electrical Stimulation;Cryotherapy;Iontophoresis '4mg'$ /ml Dexamethasone;Moist Heat;DME Instruction;Gait training;Stair training;Functional mobility training;Therapeutic activities;Therapeutic exercise;Balance training;Neuromuscular re-education;Manual techniques;Patient/family education;Taping;Dry needling;Passive range of motion    PT Next Visit Plan Assess response to HEP. Continue hip flexor and hamstring stretching. Strengthen core and hip abductors. Work  on hip hinging and Midwife -- GET [place].    PT  Home Exercise Plan Access Code: [email or web address]    Consulted and Agree with Plan of Care Patient             Patient will benefit from skilled therapeutic intervention in order to improve the following deficits and impairments:  Decreased activity tolerance, Pain, Improper body mechanics, Decreased mobility, Decreased strength, Postural dysfunction, Decreased endurance  Visit Diagnosis: Muscle weakness (generalized)  Other low back pain  Abnormal posture  Other abnormalities of gait and mobility     Problem List Patient Active Problem List   Diagnosis Date Noted   Chronic midline low back pain without sciatica 07/15/2021   Insomnia 08/02/2020   Arthritis of right glenohumeral joint 08/10/2019   Well adult exam 06/06/2019   Right shoulder pain 06/06/2019   Onychomycosis 11/25/2018   Dizziness 06/02/2018   PVC's (premature ventricular contractions) 05/19/2018   Near syncope    Chest pressure 05/17/2018   Chest pain 05/17/2018   Essential hypertension 05/05/2018   Hyperlipidemia 05/05/2018   Elevated glucose 05/05/2018   Nicotine dependence 05/05/2018    Ludie Pavlik April Gordy Levan, PT, DPT 09/13/2021, 8:53 AM  [facility] [place], [place], [address] Phone: [phone]   Fax:  [phone]  Name: Shammond Arave MRN: [id number] Date of Birth: 1960-06-07

## 2021-09-18 ENCOUNTER — Ambulatory Visit: Payer: Managed Care, Other (non HMO) | Admitting: Physical Therapy

## 2021-09-18 DIAGNOSIS — M5459 Other low back pain: Secondary | ICD-10-CM

## 2021-09-18 DIAGNOSIS — R2689 Other abnormalities of gait and mobility: Secondary | ICD-10-CM

## 2021-09-18 DIAGNOSIS — R293 Abnormal posture: Secondary | ICD-10-CM

## 2021-09-18 DIAGNOSIS — M6281 Muscle weakness (generalized): Secondary | ICD-10-CM

## 2021-09-18 NOTE — Therapy (Signed)
[place], [place], [address] Phone: [phone]   Fax:  [phone]  Physical Therapy Treatment  Patient Details  Name: Bryce King MRN: [id number] Date of Birth: Oct 06, 1960 Referring Provider (PT): Luetta Nutting, DO   Encounter Date: 09/18/2021 Rationale for Evaluation and Treatment Rehabilitation   PT End of Session - 09/18/21 1613     Visit Number 3    Number of Visits 6    Date for PT Re-Evaluation 10/14/21    PT Start Time 8588    PT Stop Time 1611    PT Time Calculation (min) 42 min    Activity Tolerance Patient tolerated treatment well    Behavior During Therapy [facility] for tasks assessed/performed             Past Medical History:  Diagnosis Date   Allergy    Arthritis    Hyperlipidemia    Hypertension    OSA (obstructive sleep apnea)    Uses CPAP   PVC's (premature ventricular contractions)     Past Surgical History:  Procedure Laterality Date   BICEPT TENODESIS Left 04/19/2020   Procedure: BICEPS TENODESIS;  Surgeon: Hiram Gash, MD;  Location: [place];  Service: Orthopedics;  Laterality: Left;   ELBOW SURGERY Right 2005   REVERSE SHOULDER ARTHROPLASTY Right 08/10/2019   Procedure: REVERSE SHOULDER ARTHROPLASTY;  Surgeon: Hiram Gash, MD;  [facility];  Service: Orthopedics;  Laterality: Right;   ROTATOR CUFF REPAIR Right 2007   SHOULDER ARTHROSCOPY WITH ROTATOR CUFF REPAIR AND SUBACROMIAL DECOMPRESSION Left 04/19/2020   Procedure: SHOULDER ARTHROSCOPY DEBRIDEMENT EXTENSIVE, DISTAL CLAVICULECTOMY WITH ROTATOR CUFF REPAIR WITH BICEPS TENODESIS;  Surgeon: Hiram Gash, MD;  Location: [place];  Service: Orthopedics;  Laterality: Left;    There were no vitals filed for this visit.   Subjective Assessment - 09/18/21 1531     Subjective Pt states his back is still sore. He says meloxicam helps for about 4-5 hours    Patient  Stated Goals Perform home/yard activities with manageable pain    Currently in Pain? Yes    Pain Score 2     Pain Location Back    Pain Orientation Left    Pain Descriptors / Indicators Sore                [facility] PT Assessment - 09/18/21 0001       Assessment   Medical Diagnosis M54.50,G89.29 (ICD-10-CM) - Chronic midline low back pain without sciatica    Referring Provider (PT) Luetta Nutting, DO    Hand Dominance Right    Prior Therapy R Shoulder TSA      Flexibility   Hamstrings Lt>Rt tightness      Palpation   Palpation comment no tenderness with palpation to lumbar spine                           [facility] Adult PT Treatment/Exercise - 09/18/21 0001       Lumbar Exercises: Stretches   Active Hamstring Stretch Left;Right;2 reps;30 seconds    Active Hamstring Stretch Limitations supine with strap    Double Knee to Chest Stretch 30 seconds    Lower Trunk Rotation 10 seconds;4 reps      Lumbar Exercises: Aerobic   Tread Mill 2.5 mph x 5 min for warm up      Lumbar Exercises: Standing   Other Standing Lumbar Exercises  pallof press blue TB 2 x 10 bilat, chops green TB x 10 bilat, sled push/pull 35# x 3 laps, farmers carry 15# x 2 laps each hand      Lumbar Exercises: Seated   Sit to Stand 20 reps    Sit to Stand Limitations green TB around thighs      Lumbar Exercises: Supine   Bridge Limitations bridge with LEs on PBall with roll in/out x 10    Bridge with March 5 reps      Lumbar Exercises: Quadruped   Other Quadruped Lumbar Exercises donkey kick x 10 bilat                          PT Long Term Goals - 09/02/21 1546       PT LONG TERM GOAL #1   Title Pt will be independent with HEP    Time 6    Period Weeks    Status New    Target Date 10/14/21      PT LONG TERM GOAL #2   Title Pt will be able to perform side plank x 45 sec each side to demo improved hip abductor and core stability    Time 6    Period Weeks    Status  New    Target Date 10/14/21      PT LONG TERM GOAL #3   Title Pt will be able to lift and carry 20# x10 with good form for decreased back pain    Time 6    Period Weeks    Status New    Target Date 10/14/21      PT LONG TERM GOAL #4   Title Pt will report >/=25% improvement with back pain during activities    Time 6    Period Weeks    Status New    Target Date 10/14/21      PT LONG TERM GOAL #5   Title Pt will have improved FOTO score to >/=62    Time 6    Period Weeks    Status New    Target Date 10/14/21                   Plan - 09/18/21 1613     Clinical Impression Statement Treatment focused on glute and core activation. Pt challenged by bridge variations, requiring rests between reps and sets. Pain with attempt at deadlifts but able to perform farmers carry with no pain.  still has difficulty with glute activation with squats    PT Next Visit Plan Assess response to HEP. Continue hip flexor and hamstring stretching. Strengthen core and hip abductors. Work on hip hinging and Midwife -- GET [place].    PT Home Exercise Plan Access Code: [email or web address]    Consulted and Agree with Plan of Care Patient             Patient will benefit from skilled therapeutic intervention in order to improve the following deficits and impairments:     Visit Diagnosis: Muscle weakness (generalized)  Other low back pain  Abnormal posture  Other abnormalities of gait and mobility     Problem List Patient Active Problem List   Diagnosis Date Noted   Chronic midline low back pain without sciatica 07/15/2021   Insomnia 08/02/2020   Arthritis of right glenohumeral joint 08/10/2019   Well adult exam 06/06/2019   Right shoulder pain 06/06/2019   Onychomycosis 11/25/2018  Dizziness 06/02/2018   PVC's (premature ventricular contractions) 05/19/2018   Near syncope    Chest pressure 05/17/2018   Chest pain 05/17/2018   Essential hypertension 05/05/2018    Hyperlipidemia 05/05/2018   Elevated glucose 05/05/2018   Nicotine dependence 05/05/2018    Stepehn Eckard, PT 09/18/2021, 4:16 PM  [facility] [place], [place], [address] Phone: [phone]   Fax:  [phone]  Name: Bryce King MRN: [id number] Date of Birth: 09/13/1960

## 2021-09-25 ENCOUNTER — Ambulatory Visit: Payer: Managed Care, Other (non HMO) | Admitting: Physical Therapy

## 2021-09-25 DIAGNOSIS — R293 Abnormal posture: Secondary | ICD-10-CM

## 2021-09-25 DIAGNOSIS — M6281 Muscle weakness (generalized): Secondary | ICD-10-CM | POA: Diagnosis not present

## 2021-09-25 DIAGNOSIS — M5459 Other low back pain: Secondary | ICD-10-CM

## 2021-09-25 NOTE — Patient Instructions (Signed)
Access Code: [email or web address] URL: [email or web address] Date: 09/25/2021 Prepared by: Isabelle Course  Exercises - Bilateral Bent Leg Lift  - 1-2 x daily - 7 x weekly - 2 sets - 10 reps - Clamshell with Resistance  - 1-2 x daily - 7 x weekly - 2 sets - 10 reps - Seated Hip Flexor Stretch  - 1 x daily - 7 x weekly - 2 sets - 30 sec hold - Seated Hamstring Stretch  - 1 x daily - 7 x weekly - 2 sets - 30 sec hold - Prone Alternating Arm and Leg Lifts  - 1-2 x daily - 7 x weekly - 2 sets - 10 reps - Sidelying Open Book Thoracic Lumbar Rotation and Extension  - 1 x daily - 7 x weekly - 3 sets - 10 reps  Patient Education - Trigger Point Dry Needling

## 2021-09-25 NOTE — Therapy (Signed)
[facility] [place], [place], [address] Phone: [phone]   Fax:  [phone]  Physical Therapy Treatment  Patient Details  Name: Bryce King MRN: [id number] Date of Birth: 02-May-1960 Referring Provider (PT): Luetta Nutting, DO   Encounter Date: 09/25/2021 Rationale for Evaluation and Treatment Rehabilitation   PT End of Session - 09/25/21 1603     Visit Number 4    Number of Visits 6    Date for PT Re-Evaluation 10/14/21    PT Start Time 1528    PT Stop Time 1603    PT Time Calculation (min) 35 min    Activity Tolerance Patient tolerated treatment well    Behavior During Therapy [facility] for tasks assessed/performed             Past Medical History:  Diagnosis Date   Allergy    Arthritis    Hyperlipidemia    Hypertension    OSA (obstructive sleep apnea)    Uses CPAP   PVC's (premature ventricular contractions)     Past Surgical History:  Procedure Laterality Date   BICEPT TENODESIS Left 04/19/2020   Procedure: BICEPS TENODESIS;  Surgeon: Hiram Gash, MD;  Location: [place];  Service: Orthopedics;  Laterality: Left;   ELBOW SURGERY Right 2005   REVERSE SHOULDER ARTHROPLASTY Right 08/10/2019   Procedure: REVERSE SHOULDER ARTHROPLASTY;  Surgeon: Hiram Gash, MD;  Location[facility];  Service: Orthopedics;  Laterality: Right;   ROTATOR CUFF REPAIR Right 2007   SHOULDER ARTHROSCOPY WITH ROTATOR CUFF REPAIR AND SUBACROMIAL DECOMPRESSION Left 04/19/2020   Procedure: SHOULDER ARTHROSCOPY DEBRIDEMENT EXTENSIVE, DISTAL CLAVICULECTOMY WITH ROTATOR CUFF REPAIR WITH BICEPS TENODESIS;  Surgeon: Hiram Gash, MD;  Location: [place];  Service: Orthopedics;  Laterality: Left;    There were no vitals filed for this visit.   Subjective Assessment - 09/25/21 1529     Subjective Pt states he "might feel a little worse". He says meloxicam is the only thing that helps     Patient Stated Goals Perform home/yard activities with manageable pain    Currently in Pain? Yes    Pain Score 3     Pain Location Back    Pain Orientation Mid    Pain Descriptors / Indicators --   uncomfortable                              [facility] Adult PT Treatment/Exercise - 09/25/21 0001       Lumbar Exercises: Stretches   Other Lumbar Stretch Exercise physioball roll out all directions 5 sec hold x 5      Lumbar Exercises: Aerobic   Tread Mill 2.5 mph x 5 min for warm up      Lumbar Exercises: Sidelying   Other Sidelying Lumbar Exercises open book x 10 bilat      Manual Therapy   Manual Therapy Soft tissue mobilization;Joint mobilization    Manual therapy comments skilled palpation to assess response to dry needling    Joint Mobilization grade 2-3 mid thoracic CPAs, UPAs    Soft tissue mobilization thoracic multifidi  and paraspinals              Trigger Point Dry Needling - 09/25/21 0001     Consent Given? Yes    Education Handout Provided Yes    Muscles Treated Back/Hip Thoracic multifidi    Thoracic multifidi response Palpable increased  muscle length                   PT Education - 09/25/21 1601     Education Details dry needling, updated HEP    Person(s) Educated Patient    Methods Explanation;Demonstration;Handout    Comprehension Returned demonstration;Verbalized understanding                 PT Long Term Goals - 09/02/21 1546       PT LONG TERM GOAL #1   Title Pt will be independent with HEP    Time 6    Period Weeks    Status New    Target Date 10/14/21      PT LONG TERM GOAL #2   Title Pt will be able to perform side plank x 45 sec each side to demo improved hip abductor and core stability    Time 6    Period Weeks    Status New    Target Date 10/14/21      PT LONG TERM GOAL #3   Title Pt will be able to lift and carry 20# x10 with good form for decreased back pain    Time 6    Period Weeks     Status New    Target Date 10/14/21      PT LONG TERM GOAL #4   Title Pt will report >/=25% improvement with back pain during activities    Time 6    Period Weeks    Status New    Target Date 10/14/21      PT LONG TERM GOAL #5   Title Pt will have improved FOTO score to >/=62    Time 6    Period Weeks    Status New    Target Date 10/14/21                   Plan - 09/25/21 1603     Clinical Impression Statement Pt with increased mid back pain today. Modified program to address thoracic mobility with stretching and manual work. Trial of dry needling    PT Next Visit Plan how was dry needling?  thoracic mobility, stretching    PT Home Exercise Plan Access Code: [email or web address]    Consulted and Agree with Plan of Care Patient             Patient will benefit from skilled therapeutic intervention in order to improve the following deficits and impairments:     Visit Diagnosis: Muscle weakness (generalized)  Other low back pain  Abnormal posture     Problem List Patient Active Problem List   Diagnosis Date Noted   Chronic midline low back pain without sciatica 07/15/2021   Insomnia 08/02/2020   Arthritis of right glenohumeral joint 08/10/2019   Well adult exam 06/06/2019   Right shoulder pain 06/06/2019   Onychomycosis 11/25/2018   Dizziness 06/02/2018   PVC's (premature ventricular contractions) 05/19/2018   Near syncope    Chest pressure 05/17/2018   Chest pain 05/17/2018   Essential hypertension 05/05/2018   Hyperlipidemia 05/05/2018   Elevated glucose 05/05/2018   Nicotine dependence 05/05/2018    Toneshia Coello, PT 09/25/2021, 4:05 PM  [facility] [place] [address], [place], [address] Phone: [phone]   Fax:  [phone]  Name: Bryce King MRN: [id number] Date of Birth: 1960-12-25

## 2021-10-06 ENCOUNTER — Encounter: Payer: Self-pay | Admitting: Cardiovascular Disease

## 2021-10-07 MED ORDER — AMLODIPINE BESYLATE 10 MG PO TABS: 10.0000 mg | ORAL_TABLET | Freq: Every day | ORAL | 0 refills | Status: DC

## 2021-10-07 MED ORDER — LISINOPRIL 40 MG PO TABS: 40.0000 mg | ORAL_TABLET | Freq: Every day | ORAL | 0 refills | Status: DC

## 2021-10-08 ENCOUNTER — Ambulatory Visit: Payer: Managed Care, Other (non HMO) | Attending: Family Medicine | Admitting: Physical Therapy

## 2021-10-08 DIAGNOSIS — M6281 Muscle weakness (generalized): Secondary | ICD-10-CM | POA: Insufficient documentation

## 2021-10-08 DIAGNOSIS — R293 Abnormal posture: Secondary | ICD-10-CM | POA: Diagnosis present

## 2021-10-08 DIAGNOSIS — M5459 Other low back pain: Secondary | ICD-10-CM | POA: Insufficient documentation

## 2021-10-08 NOTE — Patient Instructions (Signed)
Access Code: [email or web address] URL: [email or web address] Date: 10/08/2021 Prepared by: Isabelle Course  Exercises - Seated Hamstring Stretch  - 1 x daily - 7 x weekly - 2 sets - 30 sec hold - Sidelying Open Book Thoracic Lumbar Rotation and Extension  - 1 x daily - 7 x weekly - 3 sets - 10 reps - Supine Posterior Pelvic Tilt  - 1 x daily - 7 x weekly - 2 sets - 10 reps - 5 sec hold - Seated Pelvic Tilt  - 1 x daily - 7 x weekly - 2 sets - 10 reps - 5 sec hold - Standing Posterior Pelvic Tilt  - 1 x daily - 7 x weekly - 2 sets - 10 reps - 5 sec hold

## 2021-10-08 NOTE — Therapy (Addendum)
[place], [place], [address] Phone: [phone]   Fax:  [phone]  Physical Therapy Treatment and Discharge  Patient Details  Name: Bryce King MRN: [id number] Date of Birth: 18-Jan-1961 Referring Provider (PT): Luetta Nutting, DO   Encounter Date: 10/08/2021 Rationale for Evaluation and Treatment Rehabilitation   PT End of Session - 10/08/21 1559     Visit Number 5    Number of Visits 6    Date for PT Re-Evaluation 10/14/21    PT Start Time 4332    PT Stop Time 1600    PT Time Calculation (min) 30 min    Activity Tolerance Patient tolerated treatment well    Behavior During Therapy [facility] for tasks assessed/performed             Past Medical History:  Diagnosis Date   Allergy    Arthritis    Hyperlipidemia    Hypertension    OSA (obstructive sleep apnea)    Uses CPAP   PVC's (premature ventricular contractions)     Past Surgical History:  Procedure Laterality Date   BICEPT TENODESIS Left 04/19/2020   Procedure: BICEPS TENODESIS;  Surgeon: Hiram Gash, MD;  Location: [place];  Service: Orthopedics;  Laterality: Left;   ELBOW SURGERY Right 2005   REVERSE SHOULDER ARTHROPLASTY Right 08/10/2019   Procedure: REVERSE SHOULDER ARTHROPLASTY;  Surgeon: Hiram Gash, MD;  [facility];  Service: Orthopedics;  Laterality: Right;   ROTATOR CUFF REPAIR Right 2007   SHOULDER ARTHROSCOPY WITH ROTATOR CUFF REPAIR AND SUBACROMIAL DECOMPRESSION Left 04/19/2020   Procedure: SHOULDER ARTHROSCOPY DEBRIDEMENT EXTENSIVE, DISTAL CLAVICULECTOMY WITH ROTATOR CUFF REPAIR WITH BICEPS TENODESIS;  Surgeon: Hiram Gash, MD;  Location: [place];  Service: Orthopedics;  Laterality: Left;    There were no vitals filed for this visit.   Subjective Assessment - 10/08/21 1533     Subjective Pt states he had no effects from dry needling last visit. He has the worst  pain in the mornings. only relief is still from meloxicam    Patient Stated Goals Perform home/yard activities with manageable pain    Currently in Pain? Yes    Pain Score 3     Pain Location Back    Pain Orientation Mid    Pain Descriptors / Indicators Sore                [facility] PT Assessment - 10/08/21 0001       Assessment   Medical Diagnosis M54.50,G89.29 (ICD-10-CM) - Chronic midline low back pain without sciatica    Referring Provider (PT) Luetta Nutting, DO    Hand Dominance Right    Prior Therapy R Shoulder TSA      Observation/Other Assessments   Focus on Therapeutic Outcomes (FOTO)  61      AROM   Lumbar Extension WFL - no pain      Strength   Right Hip ABduction 4+/5    Left Hip ABduction 4+/5                           [facility] Adult PT Treatment/Exercise - 10/08/21 0001       Lumbar Exercises: Aerobic   UBE (Upper Arm Bike) L3 x 4 min alt fwd/bkwd      Lumbar Exercises: Standing   Other Standing Lumbar Exercises pelvic tilt x 10      Lumbar Exercises: Seated  Other Seated Lumbar Exercises seated pelvic tilt on ball and on mat x 10      Lumbar Exercises: Supine   Pelvic Tilt 10 reps    Pelvic Tilt Limitations pelvic tilt with march x 10      Lumbar Exercises: Quadruped   Other Quadruped Lumbar Exercises cat/camel x 10                     PT Education - 10/08/21 1559     Education Details updated HEP, plan to return to MD    Person(s) Educated Patient    Methods Explanation;Demonstration    Comprehension Verbalized understanding;Returned demonstration                 PT Long Term Goals - 10/08/21 1536       PT LONG TERM GOAL #1   Title Pt will be independent with HEP    Status Achieved      PT LONG TERM GOAL #2   Title Pt will be able to perform side plank x 45 sec each side to demo improved hip abductor and core stability    Status Not Met      PT LONG TERM GOAL #3   Title Pt will be able to lift  and carry 20# x10 with good form for decreased back pain    Status Achieved      PT LONG TERM GOAL #4   Title Pt will report >/=25% improvement with back pain during activities    Status Not Met      PT LONG TERM GOAL #5   Title Pt will have improved FOTO score to >/=62    Baseline 61    Status Not Met                   Plan - 10/08/21 1600     Clinical Impression Statement Pt has improved LE strength but continues with signficant back pain. He feels relief with posterior pelvic tilt. HEP updated to posterior pelvic tilt in all positions. Pt to return to MD to see about next steps    PT Next Visit Plan Hold PT. Return to MD    PT Home Exercise Plan Access Code: [email or web address]    Consulted and Agree with Plan of Care Patient             Patient will benefit from skilled therapeutic intervention in order to improve the following deficits and impairments:     Visit Diagnosis: Muscle weakness (generalized)  Other low back pain  Abnormal posture     Problem List Patient Active Problem List   Diagnosis Date Noted   Chronic midline low back pain without sciatica 07/15/2021   Insomnia 08/02/2020   Arthritis of right glenohumeral joint 08/10/2019   Well adult exam 06/06/2019   Right shoulder pain 06/06/2019   Onychomycosis 11/25/2018   Dizziness 06/02/2018   PVC's (premature ventricular contractions) 05/19/2018   Near syncope    Chest pressure 05/17/2018   Chest pain 05/17/2018   Essential hypertension 05/05/2018   Hyperlipidemia 05/05/2018   Elevated glucose 05/05/2018   Nicotine dependence 05/05/2018  PHYSICAL THERAPY DISCHARGE SUMMARY  Visits from Start of Care: 5  Current functional level related to goals / functional outcomes: Improved strength and activity tolerance   Remaining deficits: pain   Education / Equipment: HEP   Patient agrees to discharge. Patient goals were partially met. Patient is being discharged due to lack of progress.  Santiago Glad  Juanice Warburton, PT,DPT08/16/239:52 AM  Isabelle Course, PT 10/08/2021, 4:01 PM  [facility] [place], [place], [address] Phone: [phone]   Fax:  [phone]  Name: Bryce King MRN: [id number] Date of Birth: 13-Sep-1960

## 2021-10-11 ENCOUNTER — Encounter: Payer: Self-pay | Admitting: Family Medicine

## 2021-10-15 ENCOUNTER — Encounter: Payer: Self-pay | Admitting: Cardiology

## 2021-10-16 ENCOUNTER — Telehealth: Payer: Self-pay | Admitting: *Deleted

## 2021-10-16 DIAGNOSIS — G4733 Obstructive sleep apnea (adult) (pediatric): Secondary | ICD-10-CM

## 2021-10-16 MED ORDER — PREDNISONE 10 MG (48) PO TBPK
ORAL_TABLET | ORAL | 0 refills | Status: DC
Start: 2021-10-16 — End: 2021-12-19

## 2021-10-16 NOTE — Telephone Encounter (Signed)
Order placed to [facility] via community message. 

## 2021-10-16 NOTE — Telephone Encounter (Signed)
order a FFM   Upon patient request DME selection is [place] notified of new order   Patient was grateful for the call and thanked me.

## 2021-10-16 NOTE — Telephone Encounter (Signed)
Prednisone taper sent in.

## 2021-10-16 NOTE — Telephone Encounter (Signed)
Per Dr Radford Pax order a FFM

## 2021-10-26 ENCOUNTER — Encounter: Payer: Self-pay | Admitting: Family Medicine

## 2021-10-27 ENCOUNTER — Other Ambulatory Visit: Payer: Self-pay | Admitting: Family Medicine

## 2021-10-28 MED ORDER — CARVEDILOL 6.25 MG PO TABS: 6.2500 mg | ORAL_TABLET | Freq: Two times a day (BID) | ORAL | 0 refills | Status: DC

## 2021-10-28 MED ORDER — EZETIMIBE 10 MG PO TABS: 10.0000 mg | ORAL_TABLET | Freq: Every day | ORAL | 2 refills | Status: DC

## 2021-10-28 NOTE — Addendum Note (Signed)
Addended by: Carter Kitten D on: 10/28/2021 08:27 AM   Modules accepted: Orders

## 2021-11-14 ENCOUNTER — Other Ambulatory Visit: Payer: Self-pay | Admitting: Family Medicine

## 2021-11-15 MED ORDER — MELOXICAM 15 MG PO TABS: 15.0000 mg | ORAL_TABLET | Freq: Every day | ORAL | 0 refills | Status: DC | PRN

## 2021-11-22 ENCOUNTER — Other Ambulatory Visit: Payer: Self-pay | Admitting: Family Medicine

## 2021-12-19 ENCOUNTER — Ambulatory Visit: Payer: Managed Care, Other (non HMO) | Attending: Cardiology | Admitting: Cardiology

## 2021-12-19 ENCOUNTER — Encounter: Payer: Self-pay | Admitting: Cardiology

## 2021-12-19 VITALS — BP 128/70 | HR 68 | Ht 72.0 in | Wt 233.6 lb

## 2021-12-19 DIAGNOSIS — G4733 Obstructive sleep apnea (adult) (pediatric): Secondary | ICD-10-CM | POA: Diagnosis not present

## 2021-12-19 DIAGNOSIS — I10 Essential (primary) hypertension: Secondary | ICD-10-CM | POA: Diagnosis not present

## 2021-12-19 DIAGNOSIS — I493 Ventricular premature depolarization: Secondary | ICD-10-CM | POA: Diagnosis not present

## 2021-12-19 MED ORDER — LISINOPRIL 40 MG PO TABS: 40.0000 mg | ORAL_TABLET | Freq: Every day | ORAL | 3 refills | Status: DC

## 2021-12-19 MED ORDER — CHLORTHALIDONE 25 MG PO TABS: 25.0000 mg | ORAL_TABLET | Freq: Every day | ORAL | 3 refills | Status: DC

## 2021-12-19 MED ORDER — CARVEDILOL 6.25 MG PO TABS: 6.2500 mg | ORAL_TABLET | Freq: Two times a day (BID) | ORAL | 3 refills | Status: DC

## 2021-12-19 MED ORDER — AMLODIPINE BESYLATE 10 MG PO TABS: 10.0000 mg | ORAL_TABLET | Freq: Every day | ORAL | 3 refills | Status: DC

## 2021-12-19 NOTE — Patient Instructions (Signed)
Medication Instructions:  Your physician recommends that you continue on your current medications as directed. Please refer to the Current Medication list given to you today.  *If you need a refill on your cardiac medications before your next appointment, please call your pharmacy*  Follow-Up: At [facility], you and your health needs are our priority.  As part of our continuing mission to provide you with exceptional heart care, we have created designated Provider Care Teams.  These Care Teams include your primary Cardiologist (physician) and Advanced Practice Providers (APPs -  Physician Assistants and Nurse Practitioners) who all work together to provide you with the care you need, when you need it.  Your next appointment:   1 year(s)  The format for your next appointment:   In Person  Provider:   Traci Turner, MD     Important Information About Sugar       

## 2021-12-19 NOTE — Addendum Note (Signed)
Addended by: Antonieta Iba on: 12/19/2021 08:44 AM   Modules accepted: Orders

## 2021-12-19 NOTE — Progress Notes (Signed)
Date:  12/19/2021   ID:  Bryce King, DOB Apr 07, 1960, MRN [id number]  PCP:  Luetta Nutting, DO  Cardiologist:  Lauree Chandler, MD  Electrophysiologist:  None   Evaluation Performed: OSA   History of Present Illness:    Bryce King is a 61 y.o. male with hypertension, hyperlipidemia (statin intolerant, managed by primary care), sleep apnea, PVCs, prior tobacco abuse.  He has a hx of OSA and has been using CPAP for over a decade.  When I last saw him his device was old and had not had any sleep study done in years.  He underwent home sleep study showing severe OSA with an AHI of 34.6/hr and he was placed on auto CPAP from 4-15cm H2O.    He is doing well with his PAP device and thinks that hd has gotten used to it.  He tolerates the mask and feels the pressure is adequate.  Since going on PAP he feels rested in the am and has no significant daytime sleepiness.  He denies any significant mouth or nasal dryness or nasal congestion.  He does not think that he snores.   Past Medical History:  Diagnosis Date   Allergy    Arthritis    Hyperlipidemia    Hypertension    OSA (obstructive sleep apnea)    Uses CPAP   PVC's (premature ventricular contractions)    Past Surgical History:  Procedure Laterality Date   BICEPT TENODESIS Left 04/19/2020   Procedure: BICEPS TENODESIS;  Surgeon: Hiram Gash, MD;  Location: [place];  Service: Orthopedics;  Laterality: Left;   ELBOW SURGERY Right 2005   REVERSE SHOULDER ARTHROPLASTY Right 08/10/2019   Procedure: REVERSE SHOULDER ARTHROPLASTY;  Surgeon: Hiram Gash, MD;  [facility];  Service: Orthopedics;  Laterality: Right;   ROTATOR CUFF REPAIR Right 2007   SHOULDER ARTHROSCOPY WITH ROTATOR CUFF REPAIR AND SUBACROMIAL DECOMPRESSION Left 04/19/2020   Procedure: SHOULDER ARTHROSCOPY DEBRIDEMENT EXTENSIVE, DISTAL CLAVICULECTOMY WITH ROTATOR CUFF REPAIR WITH BICEPS TENODESIS;  Surgeon: Hiram Gash, MD;   Location: [place];  Service: Orthopedics;  Laterality: Left;     Current Meds  Medication Sig   amLODipine (NORVASC) 10 MG tablet Take 1 tablet (10 mg total) by mouth daily.   calcium-vitamin D (OSCAL WITH D) 250-125 MG-UNIT tablet Take 1 tablet by mouth daily.   carvedilol (COREG) 6.25 MG tablet Take 1 tablet (6.25 mg total) by mouth 2 (two) times daily.   chlorthalidone (HYGROTON) 25 MG tablet Take 1 tablet (25 mg total) by mouth daily.   Evolocumab (REPATHA SURECLICK) 983 MG/ML SOAJ INJECT 140 MG INTO THE SKIN EVERY 14 DAYS   ezetimibe (ZETIA) 10 MG tablet Take 1 tablet (10 mg total) by mouth daily.   levocetirizine (XYZAL) 5 MG tablet Take 5 mg by mouth every evening.   lisinopril (ZESTRIL) 40 MG tablet Take 1 tablet (40 mg total) by mouth daily.   melatonin 5 MG TABS Take 5 mg by mouth at bedtime.   meloxicam (MOBIC) 15 MG tablet Take 1 tablet (15 mg total) by mouth daily as needed for pain.   mometasone (ELOCON) 0.1 % cream APPLY TO AFFECTED AREA EVERY DAY   Multiple Vitamin (MULTIVITAMIN WITH MINERALS) TABS tablet Take 1 tablet by mouth daily.   propranolol (INDERAL) 10 MG tablet Take 1 tablet (10 mg total) by mouth as needed for palpitations   vitamin C (ASCORBIC ACID) 500 MG tablet Take 500 mg by mouth daily.  Allergies:   Statins   Social History   Tobacco Use   Smoking status: Every Day    Packs/day: 1.00    Types: Cigarettes   Smokeless tobacco: Never  Vaping Use   Vaping Use: Never used  Substance Use Topics   Alcohol use: Yes    Comment: weekly, "07/21/18 2 drinks a week"    Drug use: Never     Family Hx: The patient's family history includes COPD in his maternal grandmother; Cancer in his father; Depression in his father; Diabetes in his father; Heart attack in his maternal grandfather and paternal grandfather; Prostate cancer in his father. There is no history of Colon cancer, Colon polyps, Esophageal cancer, Rectal cancer, or Stomach  cancer.  ROS:   Please see the history of present illness.    All other systems reviewed and are negative.   Prior CV studies:   The following studies were reviewed today:  Home sleep study and PAP compliance download   Labs/Other Tests and Data Reviewed:    EKG: EKG was done today and demonstrates NSR with PVCs  Recent Labs: 07/08/2021: ALT 31; Hemoglobin 14.1; Platelets 208; TSH 0.90 08/22/2021: BUN 14; Creatinine, Ser 0.88; Potassium 4.0; Sodium 141   Recent Lipid Panel Lab Results  Component Value Date/Time   CHOL 100 07/08/2021 08:00 AM   TRIG 80 07/08/2021 08:00 AM   HDL 47 07/08/2021 08:00 AM   CHOLHDL 2.1 07/08/2021 08:00 AM   LDLCALC 37 07/08/2021 08:00 AM    Wt Readings from Last 3 Encounters:  12/19/21 233 lb 9.6 oz (106 kg)  07/15/21 228 lb (103.4 kg)  12/04/20 220 lb (99.8 kg)     Objective:    Vital Signs:  BP 128/70   Pulse 68   Ht 6' (1.829 m)   Wt 233 lb 9.6 oz (106 kg)   SpO2 96%   BMI 31.68 kg/m    GEN: Well nourished, well developed in no acute distress HEENT: Normal NECK: No JVD; No carotid bruits LYMPHATICS: No lymphadenopathy CARDIAC:RRR, no murmurs, rubs, gallops RESPIRATORY:  Clear to auscultation without rales, wheezing or rhonchi  ABDOMEN: Soft, non-tender, non-distended MUSCULOSKELETAL:  No edema; No deformity  SKIN: Warm and dry NEUROLOGIC:  Alert and oriented x 3 PSYCHIATRIC:  Normal affect  ASSESSMENT & PLAN:    1. PVCs  -He has not had any palpitations since I saw him last -continue Carvedilol 6.'25mg'$  BID and PRN propranolol for breakthrough palpitations  2. Essential HTN -Blood pressure adequately controlled exam today -Continue prescription drug management with amlodipine 10 mg daily, Carvedilol 6.'25mg'$  BID, Chlorthalidone '25mg'$  daily and  lisinopril 40 mg daily with as needed refills  3. OSA - The patient is tolerating PAP therapy well without any problems. The patient has been using and benefiting from PAP use and will  continue to benefit from therapy.  -I will get a copy of his download>>he will bring in his SD card    Follow Up: Followup with me in 1 year  Signed, Fransico Him, MD  12/19/2021 8:39 AM    [place]

## 2021-12-23 ENCOUNTER — Encounter: Payer: Self-pay | Admitting: Family Medicine

## 2021-12-24 ENCOUNTER — Encounter: Payer: Self-pay | Admitting: Cardiology

## 2021-12-27 ENCOUNTER — Telehealth: Payer: Self-pay | Admitting: *Deleted

## 2021-12-27 DIAGNOSIS — I10 Essential (primary) hypertension: Secondary | ICD-10-CM

## 2021-12-27 DIAGNOSIS — G4733 Obstructive sleep apnea (adult) (pediatric): Secondary | ICD-10-CM

## 2021-12-27 NOTE — Telephone Encounter (Signed)
Per Dr Radford Pax, Order [employer]Med travel auto CPAP from 4 to 20cm H2O

## 2021-12-27 NOTE — Telephone Encounter (Signed)
Order has been placed and printed out and placed at the front desk or patient to pick up.

## 2022-01-18 IMAGING — CT CT SHOULDER*R* W/O CM
3 series · 15 of 33 positions shown, 18 images · non-contrast
Comparison: Plain film right shoulder 08/10/2019. CT right shoulder
07/18/2019.
COMPARISON: Plain film right shoulder 08/10/2019. CT right shoulder
07/18/2019.

Addendum:
CLINICAL DATA: Right shoulder pain. The patient underwent right
shoulder replacement 08/19/2019.

EXAM:
CT OF THE UPPER RIGHT EXTREMITY WITHOUT CONTRAST
TECHNIQUE: Multidetector CT imaging of the upper right extremity was performed
according to the standard protocol.

[Series 2: shoulder 2.00 br40 s3 axial soft (person_name) · axial · 0.52mm/px · z∈[-1000,-812]mm · 7 of 112 slices shown, 9 images]
[im 9/112  soft-tissue]
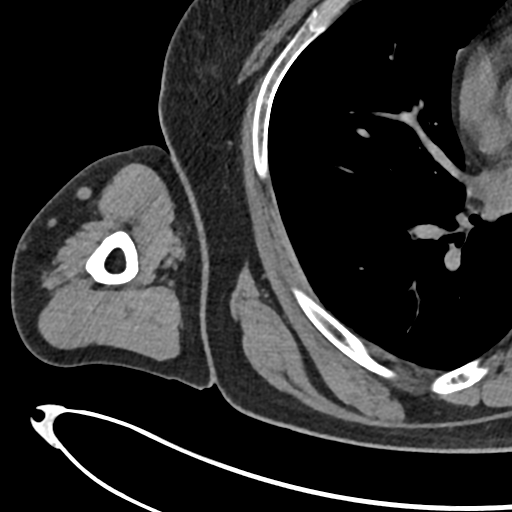
[im 9/112  bone]
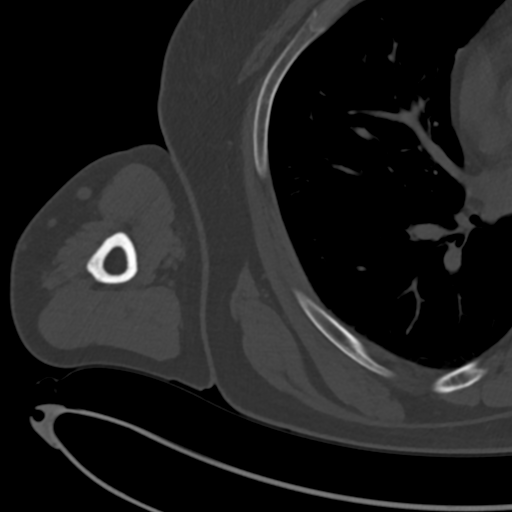
[im 26/112  bone]
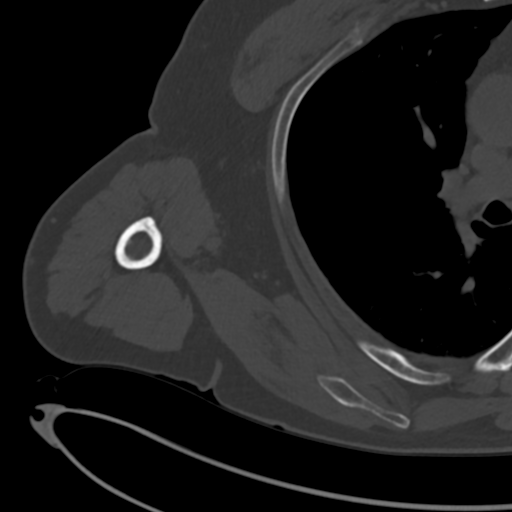
[im 43/112  bone]
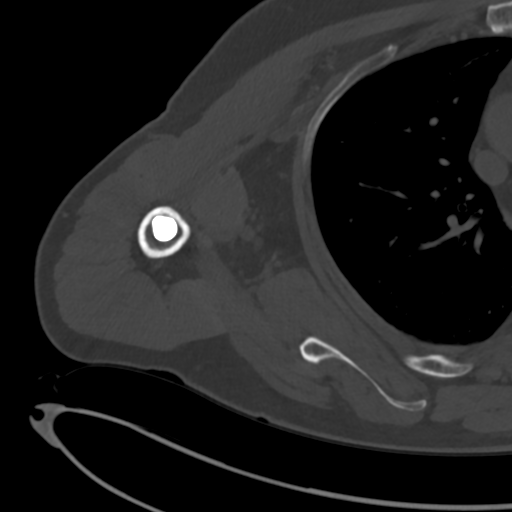
[im 60/112  bone]
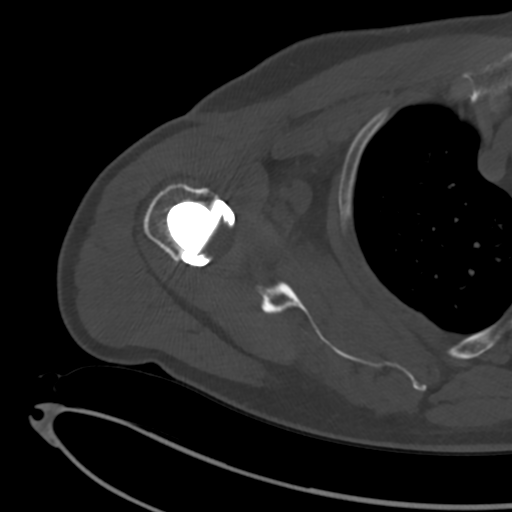
[im 69/112  soft-tissue]
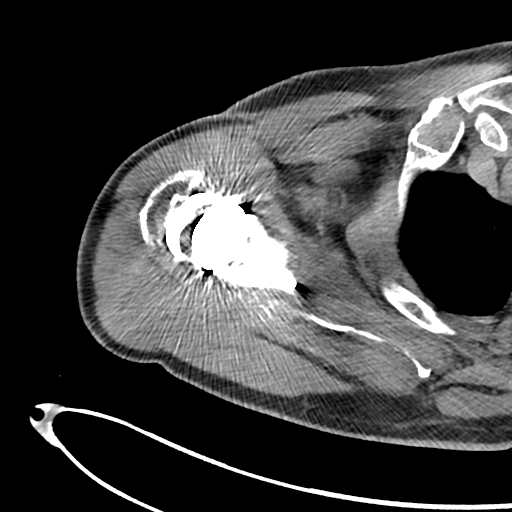
[im 69/112  bone]
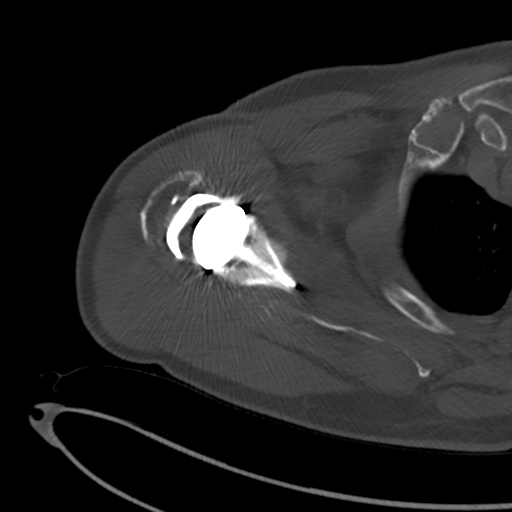
[im 86/112  bone]
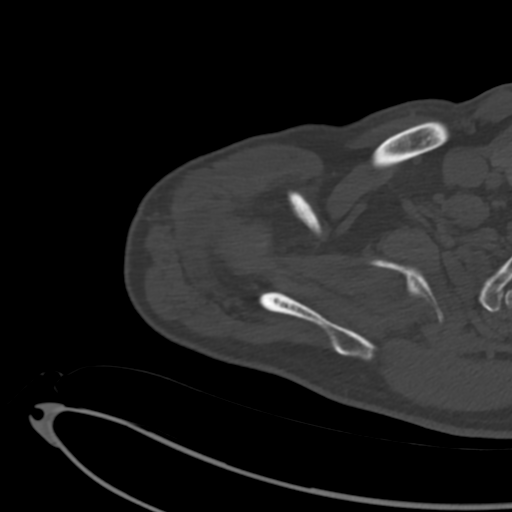
[im 103/112  bone]
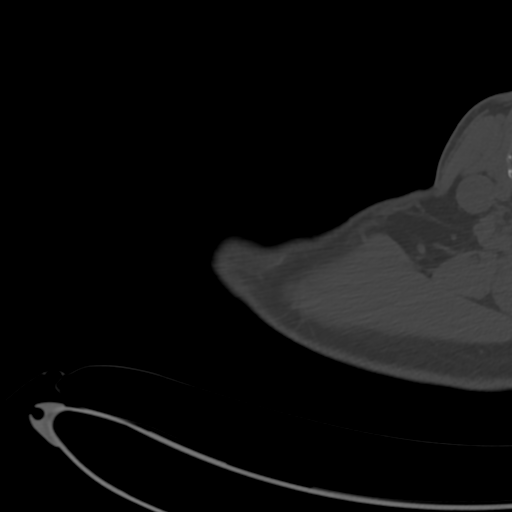

[Series 6: shoulder 2.00 br40 s3 cor soft (person_name) · coronal · 0.44mm/px · 3 of 112 slices shown]
[im 24/112  bone]
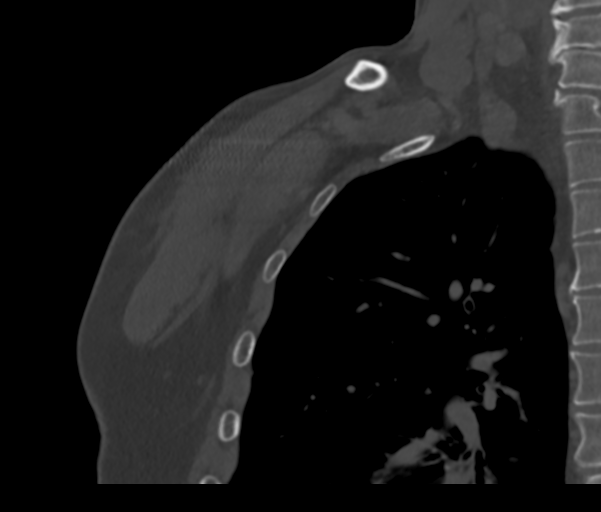
[im 45/112  bone]
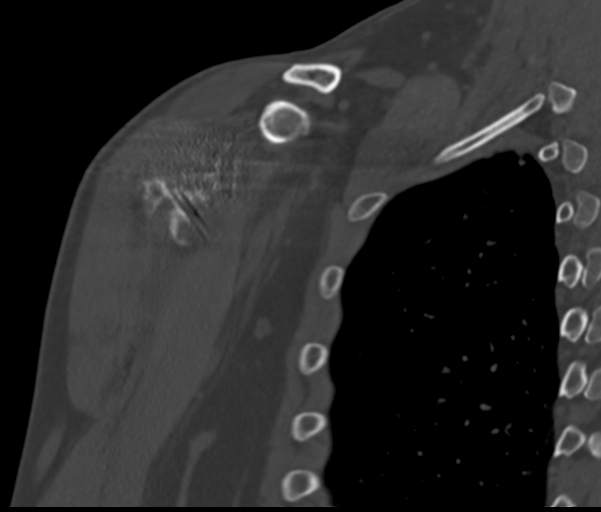
[im 67/112  bone]
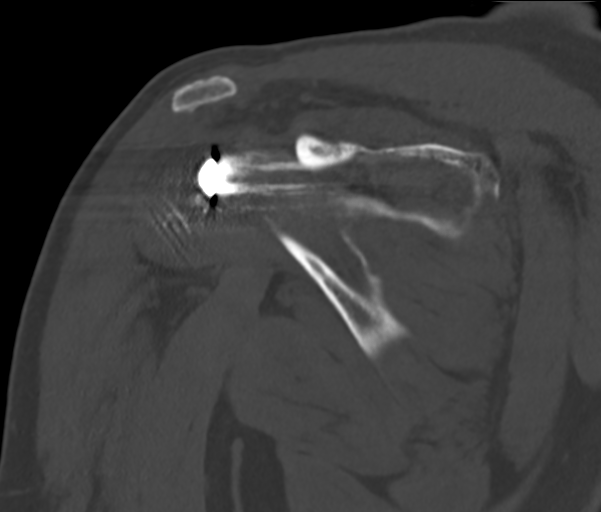

[Series 10: shoulder 2.00 br40 s3 sag soft (person_name) · sagittal · 0.44mm/px · 5 of 132 slices shown, 6 images]
[im 44/132  bone]
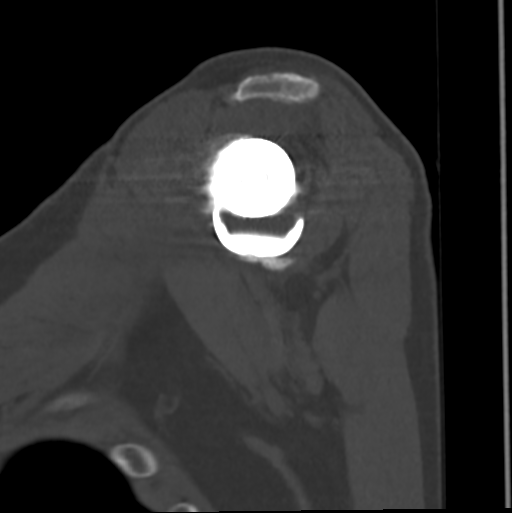
[im 55/132  bone]
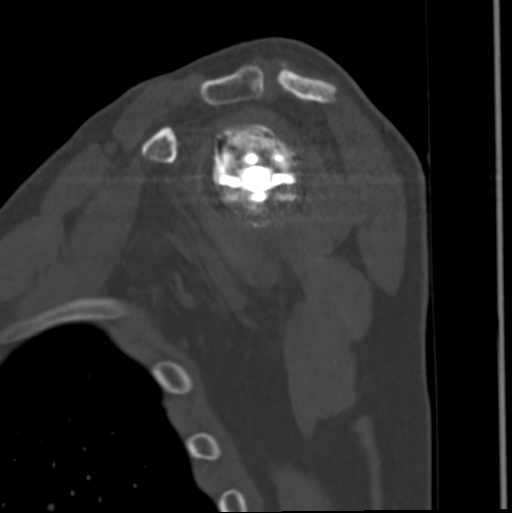
[im 66/132  soft-tissue]
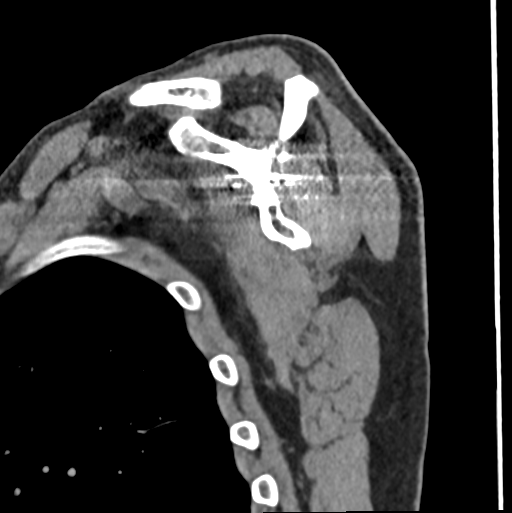
[im 66/132  bone]
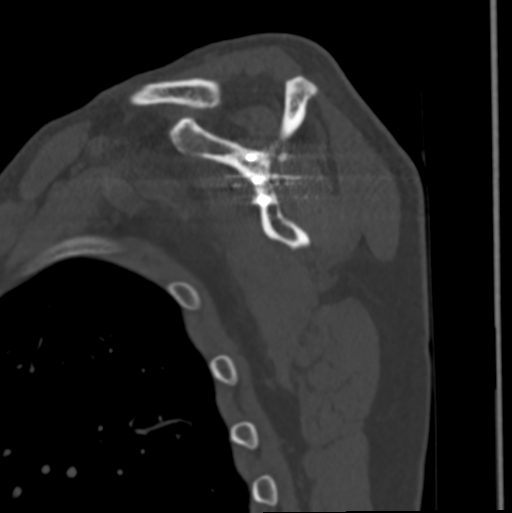
[im 77/132  bone]
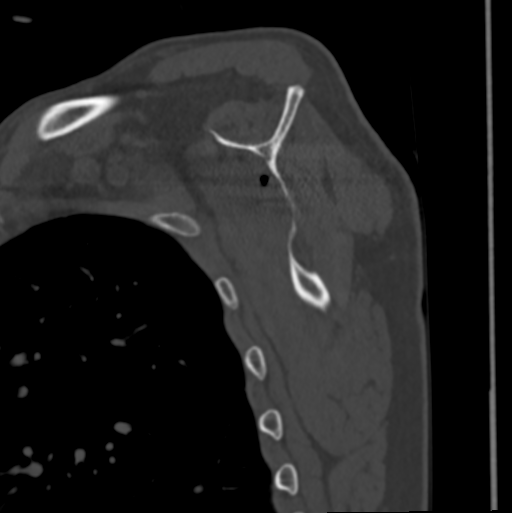
[im 88/132  bone]
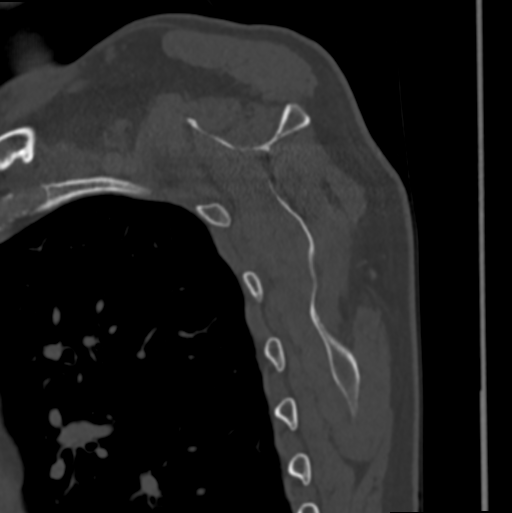

[15 of 33 positions shown; findings below may reference images not displayed]

FINDINGS: Bones/Joint/Cartilage

Reverse shoulder arthroplasty is in place. The device is located. No
fracture or evidence of stress change is seen. Hardware is intact
without evidence of loosening. The tips of the 3 most inferior
screws in the glenoid just penetrate the cortex of the glenoid neck.
No fluid collection about the device is identified although streak
artifact somewhat limits evaluation.

Ligaments

Suboptimally assessed by CT.

Muscles and Tendons

No muscle atrophy is seen.

Soft tissues

Negative.
IMPRESSION: Status post reverse right shoulder arthroplasty. Hardware is intact
without complicating feature. No fracture or other abnormality is
identified.

ADDENDUM:
This case was reviewed with the ordering clinician who called
regarding the possibility a stress fracture of the acromion. Upon
further review, there is a nondisplaced fracture of the base of the
acromion with some callus formation present consistent with subacute
injury and healing. Tiny os acromiale off the tip of the acromion is
also noted.

*** End of Addendum ***
FINDINGS: Bones/Joint/Cartilage

Reverse shoulder arthroplasty is in place. The device is located. No
fracture or evidence of stress change is seen. Hardware is intact
without evidence of loosening. The tips of the 3 most inferior
screws in the glenoid just penetrate the cortex of the glenoid neck.
No fluid collection about the device is identified although streak
artifact somewhat limits evaluation.

Ligaments

Suboptimally assessed by CT.

Muscles and Tendons

No muscle atrophy is seen.

Soft tissues

Negative.
IMPRESSION: Status post reverse right shoulder arthroplasty. Hardware is intact
without complicating feature. No fracture or other abnormality is
identified.

## 2022-01-30 ENCOUNTER — Encounter: Payer: Self-pay | Admitting: Cardiology

## 2022-01-31 ENCOUNTER — Encounter: Payer: Self-pay | Admitting: Family Medicine

## 2022-01-31 ENCOUNTER — Ambulatory Visit (INDEPENDENT_AMBULATORY_CARE_PROVIDER_SITE_OTHER): Payer: Managed Care, Other (non HMO) | Admitting: Family Medicine

## 2022-01-31 ENCOUNTER — Ambulatory Visit (INDEPENDENT_AMBULATORY_CARE_PROVIDER_SITE_OTHER): Payer: Managed Care, Other (non HMO)

## 2022-01-31 VITALS — BP 117/65 | HR 68 | Ht 72.0 in | Wt 228.0 lb

## 2022-01-31 DIAGNOSIS — M5412 Radiculopathy, cervical region: Secondary | ICD-10-CM

## 2022-01-31 DIAGNOSIS — M545 Low back pain, unspecified: Secondary | ICD-10-CM | POA: Diagnosis not present

## 2022-01-31 DIAGNOSIS — G8929 Other chronic pain: Secondary | ICD-10-CM

## 2022-01-31 MED ORDER — CARVEDILOL 6.25 MG PO TABS: 6.2500 mg | ORAL_TABLET | Freq: Two times a day (BID) | ORAL | 3 refills | Status: DC

## 2022-01-31 MED ORDER — HYDROCODONE-ACETAMINOPHEN 5-325 MG PO TABS: 1.0000 | ORAL_TABLET | Freq: Four times a day (QID) | ORAL | 0 refills | Status: DC | PRN

## 2022-01-31 MED ORDER — PREDNISONE 10 MG (21) PO TBPK: ORAL_TABLET | ORAL | 0 refills | Status: DC

## 2022-01-31 NOTE — Addendum Note (Signed)
Addended by: Peggye Ley on: 01/31/2022 11:31 AM   Modules accepted: Orders

## 2022-01-31 NOTE — Assessment & Plan Note (Addendum)
Continues to have low back pain despite >6 weeks of physician directed conservative therapy including anti-inflammatories and physical therapy.  Will proceed with MRI for interventional planning.  Anti-inflammatories are ineffective for pain.  Will add norco short term.

## 2022-01-31 NOTE — Assessment & Plan Note (Addendum)
Initiating conservative therapy with steroids, handout for home exercise plan and xrays of cervical spine.

## 2022-01-31 NOTE — Patient Instructions (Signed)
We'll be in touch with xray results.  You'll be contacted to schedule MRI of the back.   Start prednisone taper and use pain medication only for severe pain.

## 2022-01-31 NOTE — Progress Notes (Signed)
Bryce King - 61 y.o. male MRN [id number]  Date of birth: 1960/11/25  Subjective Chief Complaint  Patient presents with   Nerve pain    HPI Bryce King is a 61 y.o. male here today with complaint of neck pain.  Pain started about 7-10 days ago.  He does have radiation of pain into the R arm with some associated numbness.  The arm does feel weak at times.  He denies fever or chills.  He does not recall any injury or overuse.  So far he has tried ibuprofen or meloxicam and tylenol.    He continues to have low back pain.  This has not improved with anti-inflammatories or course of physical therapy.  He is having trouble sleeping due to pain.  No radicular symptoms at this time.   ROS:  A comprehensive ROS was completed and negative except as noted per HPI  Allergies  Allergen Reactions   Statins Other (See Comments)    Muscle aches    Past Medical History:  Diagnosis Date   Allergy    Arthritis    Hyperlipidemia    Hypertension    OSA (obstructive sleep apnea)    Uses CPAP   PVC's (premature ventricular contractions)     Past Surgical History:  Procedure Laterality Date   BICEPT TENODESIS Left 04/19/2020   Procedure: BICEPS TENODESIS;  Surgeon: Hiram Gash, MD;  Location: [facility];  Service: Orthopedics;  Laterality: Left;   ELBOW SURGERY Right 2005   REVERSE SHOULDER ARTHROPLASTY Right 08/10/2019   Procedure: REVERSE SHOULDER ARTHROPLASTY;  Surgeon: Hiram Gash, MD;  Location: [facility];  Service: Orthopedics;  Laterality: Right;   ROTATOR CUFF REPAIR Right 2007   SHOULDER ARTHROSCOPY WITH ROTATOR CUFF REPAIR AND SUBACROMIAL DECOMPRESSION Left 04/19/2020   Procedure: SHOULDER ARTHROSCOPY DEBRIDEMENT EXTENSIVE, DISTAL CLAVICULECTOMY WITH ROTATOR CUFF REPAIR WITH BICEPS TENODESIS;  Surgeon: Hiram Gash, MD;  Location: [facility];  Service: Orthopedics;  Laterality: Left;    Social History   Socioeconomic History   Marital  status: Married    Spouse name: Not on file   Number of children: 1   Years of education: Not on file   Highest education level: Some college, no degree  Occupational History   Not on file  Tobacco Use   Smoking status: Every Day    Packs/day: 1.00    Types: Cigarettes   Smokeless tobacco: Never  Vaping Use   Vaping Use: Never used  Substance and Sexual Activity   Alcohol use: Yes    Comment: weekly, "07/21/18 2 drinks a week"    Drug use: Never   Sexual activity: Not on file  Other Topics Concern   Not on file  Social History Narrative   Lives with wife   Caffeine- coffee 2 cups, tea all day   Social Determinants of Health   Financial Resource Strain: Not on file  Food Insecurity: Not on file  Transportation Needs: Not on file  Physical Activity: Not on file  Stress: Not on file  Social Connections: Not on file    Family History  Problem Relation Age of Onset   Cancer Father    Depression Father    Diabetes Father    Prostate cancer Father    COPD Maternal Grandmother    Heart attack Maternal Grandfather    Heart attack Paternal Grandfather    Colon cancer Neg Hx    Colon polyps Neg Hx  Esophageal cancer Neg Hx    Rectal cancer Neg Hx    Stomach cancer Neg Hx     Health Maintenance  Topic Date Due   Zoster Vaccines- Shingrix (2 of 2) 11/23/2019   COVID-19 Vaccine (4 - Pfizer risk series) 03/14/2020   INFLUENZA VACCINE  10/29/2021   Hepatitis C Screening  02/01/2023 (Originally 07/07/1978)   COLONOSCOPY (Pts 45-69yr Insurance coverage will need to be confirmed)  01/25/2023   TETANUS/TDAP  08/03/2030   HIV Screening  Completed   HPV VACCINES  Aged Out     ----------------------------------------------------------------------------------------------------------------------------------------------------------------------------------------------------------------- Physical Exam BP 117/65 (BP Location: Left Arm, Patient Position: Sitting, Cuff Size: Large)    Pulse 68   Ht 6' (1.829 m)   Wt 228 lb (103.4 kg)   SpO2 98%   BMI 30.92 kg/m   Physical Exam Constitutional:      Appearance: Normal appearance.  Eyes:     General: No scleral icterus. Cardiovascular:     Rate and Rhythm: Normal rate and regular rhythm.  Pulmonary:     Effort: Pulmonary effort is normal.     Breath sounds: Normal breath sounds.  Musculoskeletal:     Comments: ROM is pretty good with pain on R rotation.  He has a positive spurlings on the R.    Neurological:     Mental Status: He is alert.  Psychiatric:        Mood and Affect: Mood normal.        Behavior: Behavior normal.     ------------------------------------------------------------------------------------------------------------------------------------------------------------------------------------------------------------------- Assessment and Plan  Chronic midline low back pain without sciatica Continues to have low back pain despite >6 weeks of physician directed conservative therapy including anti-inflammatories and physical therapy.  Will proceed with MRI for interventional planning.  Anti-inflammatories are ineffective for pain.  Will add norco short term.   Cervical radiculopathy Initiating conservative therapy with steroids, handout for home exercise plan and xrays of cervical spine.     Meds ordered this encounter  Medications   HYDROcodone-acetaminophen (NORCO) 5-325 MG tablet    Sig: Take 1 tablet by mouth every 6 (six) hours as needed for up to 5 days for moderate pain.    Dispense:  20 tablet    Refill:  0   predniSONE (STERAPRED UNI-PAK 21 TAB) 10 MG (21) TBPK tablet    Sig: Taper as directed on packaging.    Dispense:  21 tablet    Refill:  0    No follow-ups on file.    This visit occurred during the SARS-CoV-2 public health emergency.  Safety protocols were in place, including screening questions prior to the visit, additional usage of staff PPE, and extensive cleaning of  exam room while observing appropriate contact time as indicated for disinfecting solutions.

## 2022-02-03 ENCOUNTER — Other Ambulatory Visit: Payer: Self-pay

## 2022-02-03 ENCOUNTER — Encounter (HOSPITAL_COMMUNITY): Payer: Self-pay | Admitting: Emergency Medicine

## 2022-02-03 ENCOUNTER — Emergency Department (HOSPITAL_COMMUNITY): Payer: Managed Care, Other (non HMO)

## 2022-02-03 ENCOUNTER — Emergency Department (HOSPITAL_COMMUNITY)
Admission: EM | Admit: 2022-02-03 | Discharge: 2022-02-03 | Disposition: A | Discharge status: Home / Self Care | Payer: Managed Care, Other (non HMO) | Attending: Emergency Medicine | Admitting: Emergency Medicine

## 2022-02-03 DIAGNOSIS — M542 Cervicalgia: Secondary | ICD-10-CM | POA: Diagnosis not present

## 2022-02-03 DIAGNOSIS — W01198A Fall on same level from slipping, tripping and stumbling with subsequent striking against other object, initial encounter: Secondary | ICD-10-CM | POA: Diagnosis not present

## 2022-02-03 DIAGNOSIS — S0990XA Unspecified injury of head, initial encounter: Secondary | ICD-10-CM | POA: Insufficient documentation

## 2022-02-03 DIAGNOSIS — R4182 Altered mental status, unspecified: Secondary | ICD-10-CM | POA: Diagnosis not present

## 2022-02-03 DIAGNOSIS — S060X1A Concussion with loss of consciousness of 30 minutes or less, initial encounter: Secondary | ICD-10-CM

## 2022-02-03 LAB — CBC
HCT: 38.4 % — ABNORMAL LOW (ref 39.0–52.0)
Hemoglobin: 13.2 g/dL (ref 13.0–17.0)
MCH: 32 pg (ref 26.0–34.0)
MCHC: 34.4 g/dL (ref 30.0–36.0)
MCV: 93.2 fL (ref 80.0–100.0)
Platelets: 217 10*3/uL (ref 150–400)
RBC: 4.12 MIL/uL — ABNORMAL LOW (ref 4.22–5.81)
RDW: 11.9 % (ref 11.5–15.5)
WBC: 5.8 10*3/uL (ref 4.0–10.5)
nRBC: 0 % (ref 0.0–0.2)

## 2022-02-03 LAB — COMPREHENSIVE METABOLIC PANEL
ALT: 37 U/L (ref 0–44)
AST: 30 U/L (ref 15–41)
Albumin: 4.1 g/dL (ref 3.5–5.0)
Alkaline Phosphatase: 48 U/L (ref 38–126)
Anion gap: 10 (ref 5–15)
BUN: 14 mg/dL (ref 6–20)
CO2: 23 mmol/L (ref 22–32)
Calcium: 9.4 mg/dL (ref 8.9–10.3)
Chloride: 101 mmol/L (ref 98–111)
Creatinine, Ser: 0.8 mg/dL (ref 0.61–1.24)
GFR, Estimated: >60 mL/min (ref >60)
Glucose, Bld: 169 mg/dL — ABNORMAL HIGH (ref 70–99)
Potassium: 4 mmol/L (ref 3.5–5.1)
Sodium: 134 mmol/L — ABNORMAL LOW (ref 135–145)
Total Bilirubin: 1 mg/dL (ref 0.3–1.2)
Total Protein: 6.5 g/dL (ref 6.5–8.1)

## 2022-02-03 LAB — URINALYSIS, ROUTINE W REFLEX MICROSCOPIC
Bilirubin Urine: NEGATIVE
Glucose, UA: NEGATIVE mg/dL
Hgb urine dipstick: NEGATIVE
Ketones, ur: NEGATIVE mg/dL
Leukocytes,Ua: NEGATIVE
Nitrite: NEGATIVE
Protein, ur: NEGATIVE mg/dL
Specific Gravity, Urine: 1.012 (ref 1.005–1.030)
pH: 7 (ref 5.0–8.0)

## 2022-02-03 LAB — ETHANOL: Alcohol, Ethyl (B): <10 mg/dL (ref <10)

## 2022-02-03 LAB — I-STAT CHEM 8, ED
BUN: 15 mg/dL (ref 6–20)
Calcium, Ion: 1.16 mmol/L (ref 1.15–1.40)
Chloride: 99 mmol/L (ref 98–111)
Creatinine, Ser: 0.7 mg/dL (ref 0.61–1.24)
Glucose, Bld: 172 mg/dL — ABNORMAL HIGH (ref 70–99)
HCT: 39 % (ref 39.0–52.0)
Hemoglobin: 13.3 g/dL (ref 13.0–17.0)
Potassium: 4.1 mmol/L (ref 3.5–5.1)
Sodium: 134 mmol/L — ABNORMAL LOW (ref 135–145)
TCO2: 23 mmol/L (ref 22–32)

## 2022-02-03 LAB — RAPID URINE DRUG SCREEN, HOSP PERFORMED
Amphetamines: NOT DETECTED
Barbiturates: NOT DETECTED
Benzodiazepines: NOT DETECTED
Cocaine: NOT DETECTED
Opiates: POSITIVE — AB
Tetrahydrocannabinol: POSITIVE — AB

## 2022-02-03 LAB — I-STAT ARTERIAL BLOOD GAS, ED
Acid-Base Excess: 0 mmol/L (ref 0.0–2.0)
Bicarbonate: 23 mmol/L (ref 20.0–28.0)
Calcium, Ion: 1.22 mmol/L (ref 1.15–1.40)
HCT: 35 % — ABNORMAL LOW (ref 39.0–52.0)
Hemoglobin: 11.9 g/dL — ABNORMAL LOW (ref 13.0–17.0)
O2 Saturation: 96 %
Patient temperature: 97.9
Potassium: 3.9 mmol/L (ref 3.5–5.1)
Sodium: 133 mmol/L — ABNORMAL LOW (ref 135–145)
TCO2: 24 mmol/L (ref 22–32)
pCO2 arterial: 32.2 mmHg (ref 32–48)
pH, Arterial: 7.459 — ABNORMAL HIGH (ref 7.35–7.45)
pO2, Arterial: 78 mmHg — ABNORMAL LOW (ref 83–108)

## 2022-02-03 LAB — PROTIME-INR
INR: 1.1 (ref 0.8–1.2)
Prothrombin Time: 13.8 seconds (ref 11.4–15.2)

## 2022-02-03 LAB — ACETAMINOPHEN LEVEL: Acetaminophen (Tylenol), Serum: <10 ug/mL — ABNORMAL LOW (ref 10–30)

## 2022-02-03 LAB — TROPONIN I (HIGH SENSITIVITY): Troponin I (High Sensitivity): 3 ng/L (ref <18)

## 2022-02-03 NOTE — ED Provider Triage Note (Addendum)
Emergency Medicine Provider Triage Evaluation Note  Bryce King , a 61 y.o. male  was evaluated in triage.  Pt presents with his family this morning.  Presented to hospital initially to bring his wife for surgery, however family diverted to the emergency department.  Patient reportedly fell down part of the stairs in his basement this morning, striking his head.  Per family and patient he is on blood thinning medication but unclear from chart review which medication he is on.  Patient has been confused and altered in his mental status since the fall with a large hematoma over the right temple.  Review of Systems  Positive: Negative:  Unable to obtain ROS due to altered mental status Physical Exam  There were no vitals taken for this visit. Gen:   Awake, no distress   Resp:  Normal effort  MSK:   Moves extremities without difficulty  Other:  Large hematoma over the right temple, PERRL, EOMI.  Patient able to follow commands though confused in his response to questions.  States he cannot tell me what happened this morning aside from the nose he fell down the stairs.  States he is on blood thinning medicines but he does not know the name.  Medical Decision Making  Medically screening exam initiated at 5:52 AM.  Appropriate orders placed.  Bryce King was informed that the remainder of the evaluation will be completed by another provider, this initial triage assessment does not replace that evaluation, and the importance of remaining in the ED until their evaluation is complete.  This chart was dictated using voice recognition software, Dragon. Despite the best efforts of this provider to proofread and correct errors, errors may still occur which can change documentation meaning.  ADDENDUM: Collateral history obtained from the patient's wife.  She states that she is not aware that he fell down the stairs.  States that he was behaving normally then was sitting on the couch and became  unresponsive.  Was breathing but she could not get him to wake up.  When he did finally wake up he was confused, ambulated to the kitchen where he had a fall.  Has been altered since prior to his fall in the kitchen per her knowledge.  She states he is on blood thinning medicine including aspirin but she cannot tell me the name of any blood thinners.  No anticoagulation listed in his chart.  This chart was dictated using voice recognition software, Dragon. Despite the best efforts of this provider to proofread and correct errors, errors may still occur which can change documentation meaning.    Emeline Darling, PA-C 02/03/22 0559

## 2022-02-03 NOTE — ED Triage Notes (Signed)
Per Son, pt took a fall today in the basement.  Pt's family members reports he is very altered, not recognizing family members, seeing two of people, not being able to identify objects.  Pt not very helpful in triage.

## 2022-02-03 NOTE — ED Provider Notes (Signed)
Blood pressure 119/74, pulse 69, temperature 98 F (36.7 C), temperature source Oral, resp. rate 17, height 6' (1.829 m), weight 104.3 kg, SpO2 99 %.  Assuming care from Dr. Betsey Holiday.  In short, Bryce King is a 61 y.o. male with a chief complaint of Fall .  Refer to the original H&P for additional details.  The current plan of care is to follow up on labs and reassess. Imaging has resulted and reassuring.   07:55 AM On reassessment, patient is awake, alert, and appropriate here. No AMS currently but does have amnesia related to the event. No stigmata of seizure or witnessed seizure activity. Concussion is a consideration here with amnesia. Syncope is also a consideration. Have added on a troponin. Family at bedside for patient. Updated and if troponin is reassuring can consider discharge with plan for Cardiology/PCP follow up with ? Syncope episode and would also consider Neurology follow up for concussion mgmt as an outpatient.   09:45 AM  UDS positive for THC.  Patient takes opiate pain medicines at home.  No evidence of urinary tract infection.  Troponin within normal limits.  Plan for discharge with plan for close PCP/cardiology follow-up and will place a referral in our system for neurology for postconcussion follow-up appointment. Patient to be discharged home with family.     Margette Fast, MD 02/03/22 [phone]

## 2022-02-03 NOTE — ED Provider Notes (Signed)
[facility] EMERGENCY DEPARTMENT Provider Note   CSN: [id number] Arrival date & time: 02/03/22  0537     History  Chief Complaint  Patient presents with   Bryce King is a 61 y.o. male.  Presents to the emergency for evaluation of mental status changes, fall, possible syncope.  Information provided by patient and his wife.  Patient was witnessed by his wife this morning when she woke up being somewhat confused.  He ambulated to the chin at which time he fell, hitting his head.  Clear if he lost consciousness but wife reports that she had some difficulty getting him up.  Patient does not remember this occurring, thinks he hit his head when he was getting into the car this morning.  Complains of neck and back pain which is chronic.       Home Medications Prior to Admission medications   Medication Sig Start Date End Date Taking? Authorizing Provider  amLODipine (NORVASC) 10 MG tablet Take 1 tablet (10 mg total) by mouth daily. 12/19/21   Sueanne Margarita, MD  calcium-vitamin D (OSCAL WITH D) 250-125 MG-UNIT tablet Take 1 tablet by mouth daily.    [provider]  carvedilol (COREG) 6.25 MG tablet Take 1 tablet (6.25 mg total) by mouth 2 (two) times daily. 01/31/22   Sueanne Margarita, MD  chlorthalidone (HYGROTON) 25 MG tablet Take 1 tablet (25 mg total) by mouth daily. 12/19/21   Sueanne Margarita, MD  Evolocumab (REPATHA SURECLICK) 694 MG/ML SOAJ INJECT 140 MG INTO THE SKIN EVERY 14 DAYS 11/22/21   Luetta Nutting, DO  ezetimibe (ZETIA) 10 MG tablet Take 1 tablet (10 mg total) by mouth daily. 10/28/21   Luetta Nutting, DO  HYDROcodone-acetaminophen (NORCO) 5-325 MG tablet Take 1 tablet by mouth every 6 (six) hours as needed for up to 5 days for moderate pain. 01/31/22 02/05/22  Luetta Nutting, DO  levocetirizine (XYZAL) 5 MG tablet Take 5 mg by mouth every evening.    [provider]  lisinopril (ZESTRIL) 40 MG tablet Take 1 tablet (40 mg  total) by mouth daily. 12/19/21   Sueanne Margarita, MD  melatonin 5 MG TABS Take 5 mg by mouth at bedtime.    [provider]  meloxicam (MOBIC) 15 MG tablet Take 1 tablet (15 mg total) by mouth daily as needed for pain. 11/15/21   Luetta Nutting, DO  mometasone (ELOCON) 0.1 % cream APPLY TO AFFECTED AREA EVERY DAY 07/15/21   Luetta Nutting, DO  Multiple Vitamin (MULTIVITAMIN WITH MINERALS) TABS tablet Take 1 tablet by mouth daily.    [provider]  predniSONE (STERAPRED UNI-PAK 21 TAB) 10 MG (21) TBPK tablet Taper as directed on packaging. 01/31/22   Luetta Nutting, DO  propranolol (INDERAL) 10 MG tablet Take 1 tablet (10 mg total) by mouth as needed for palpitations 08/15/20   Charlie Pitter, PA-C  vitamin C (ASCORBIC ACID) 500 MG tablet Take 500 mg by mouth daily.    [provider]  zolpidem (AMBIEN) 10 MG tablet Take 0.5-1 tablets (5-10 mg total) by mouth at bedtime as needed for sleep. 07/15/21 10/13/21  Luetta Nutting, DO      Allergies    Statins    Review of Systems   Review of Systems  Physical Exam Updated Vital Signs BP 117/71   Pulse 74   Temp 97.9 F (36.6 C) (Oral)   Resp 16   Ht 6' (1.829 m)  Wt 104.3 kg   SpO2 96%   BMI 31.19 kg/m  Physical Exam Vitals and nursing note reviewed.  Constitutional:      General: He is not in acute distress.    Appearance: He is well-developed.  HENT:     Head: Normocephalic. Contusion present.      Mouth/Throat:     Mouth: Mucous membranes are moist.  Eyes:     General: Vision grossly intact. Gaze aligned appropriately.     Extraocular Movements: Extraocular movements intact.     Conjunctiva/sclera: Conjunctivae normal.  Cardiovascular:     Rate and Rhythm: Normal rate and regular rhythm.     Pulses: Normal pulses.     Heart sounds: Normal heart sounds, S1 normal and S2 normal. No murmur heard.    No friction rub. No gallop.  Pulmonary:     Effort: Pulmonary effort is normal. No respiratory distress.      Breath sounds: Normal breath sounds.  Abdominal:     Palpations: Abdomen is soft.     Tenderness: There is no abdominal tenderness. There is no guarding or rebound.     Hernia: No hernia is present.  Musculoskeletal:        General: No swelling.     Cervical back: Full passive range of motion without pain, normal range of motion and neck supple. No pain with movement, spinous process tenderness or muscular tenderness. Normal range of motion.     Right lower leg: No edema.     Left lower leg: No edema.  Skin:    General: Skin is warm and dry.     Capillary Refill: Capillary refill takes less than 2 seconds.     Findings: No ecchymosis, erythema, lesion or wound.  Neurological:     Mental Status: He is alert and oriented to person, place, and time.     GCS: GCS eye subscore is 4. GCS verbal subscore is 5. GCS motor subscore is 6.     Cranial Nerves: Cranial nerves 2-12 are intact.     Sensory: Sensation is intact.     Motor: Motor function is intact. No weakness or abnormal muscle tone.     Coordination: Coordination is intact.  Psychiatric:        Mood and Affect: Mood normal.        Speech: Speech normal.        Behavior: Behavior normal.        Cognition and Memory: He exhibits impaired recent memory.     ED Results / Procedures / Treatments   Labs (all labs ordered are listed, but only abnormal results are displayed) Labs Reviewed  CBC - Abnormal; Notable for the following components:      Result Value   RBC 4.12 (*)    HCT 38.4 (*)    All other components within normal limits  I-STAT CHEM 8, ED - Abnormal; Notable for the following components:   Sodium 134 (*)    Glucose, Bld 172 (*)    All other components within normal limits  PROTIME-INR  COMPREHENSIVE METABOLIC PANEL  URINALYSIS, ROUTINE W REFLEX MICROSCOPIC  ETHANOL  RAPID URINE DRUG SCREEN, HOSP PERFORMED  ACETAMINOPHEN LEVEL  I-STAT ARTERIAL BLOOD GAS, ED  SAMPLE TO BLOOD BANK    EKG EKG  Interpretation  Date/Time:  Monday February 03 2022 06:27:14 EST Ventricular Rate:  75 PR Interval:  281 QRS Duration: 107 QT Interval:  388 QTC Calculation: 434 R Axis:   59 Text Interpretation: Sinus  rhythm Ventricular premature complex Confirmed by Orpah Greek [id number]) on 02/03/2022 6:28:57 AM  Radiology CT HEAD WO CONTRAST  Result Date: 02/03/2022 CLINICAL DATA:  61 year old male with history of trauma from a fall complaining of neck pain. Altered mental status. EXAM: CT HEAD WITHOUT CONTRAST CT CERVICAL SPINE WITHOUT CONTRAST TECHNIQUE: Multidetector CT imaging of the head and cervical spine was performed following the standard protocol without intravenous contrast. Multiplanar CT image reconstructions of the cervical spine were also generated. RADIATION DOSE REDUCTION: This exam was performed according to the departmental dose-optimization program which includes automated exposure control, adjustment of the mA and/or kV according to patient size and/or use of iterative reconstruction technique. COMPARISON:  Brain MRI 07/20/2018. 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 No evidence of acute infarction, hemorrhage, hydrocephalus, extra-axial collection or mass lesion/mass effect. Vascular: No hyperdense vessel or unexpected calcification. Skull: Normal. Negative for fracture or focal lesion. Sinuses/Orbits: No acute finding. Other: High attenuation soft tissue swelling in the right frontotemporal scalp (axial image 20 of series 3 and coronal image 23 of series 5) measuring 5.7 x 0.9 x 5.3 cm, compatible with a scalp hematoma. CT CERVICAL SPINE FINDINGS Alignment: Normal. Skull base and vertebrae: No acute fracture. No primary bone lesion or focal pathologic process. Soft tissues and spinal canal: No prevertebral fluid or swelling. No visible canal hematoma. Disc levels: Multilevel degenerative disc disease, most evident at C5-C6,  C6-C7 and C7-T1. Moderate multilevel facet arthropathy bilaterally. Upper chest: Unremarkable. Other: None. IMPRESSION: 1. Large right frontotemporal scalp hematoma. No underlying displaced skull fracture or signs of significant acute traumatic injury to the brain. The appearance of the brain is normal. 2. No evidence of significant acute traumatic injury to the cervical spine. 3. Multilevel degenerative disc disease and cervical spondylosis, as above. Electronically Signed   By: Vinnie Langton M.D.   On: 02/03/2022 06:29   CT Cervical Spine Wo Contrast  Result Date: 02/03/2022 CLINICAL DATA:  61 year old male with history of trauma from a fall complaining of neck pain. Altered mental status. EXAM: CT HEAD WITHOUT CONTRAST CT CERVICAL SPINE WITHOUT CONTRAST TECHNIQUE: Multidetector CT imaging of the head and cervical spine was performed following the standard protocol without intravenous contrast. Multiplanar CT image reconstructions of the cervical spine were also generated. RADIATION DOSE REDUCTION: This exam was performed according to the departmental dose-optimization program which includes automated exposure control, adjustment of the mA and/or kV according to patient size and/or use of iterative reconstruction technique. COMPARISON:  Brain MRI 07/20/2018. 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 No evidence of acute infarction, hemorrhage, hydrocephalus, extra-axial collection or mass lesion/mass effect. Vascular: No hyperdense vessel or unexpected calcification. Skull: Normal. Negative for fracture or focal lesion. Sinuses/Orbits: No acute finding. Other: High attenuation soft tissue swelling in the right frontotemporal scalp (axial image 20 of series 3 and coronal image 23 of series 5) measuring 5.7 x 0.9 x 5.3 cm, compatible with a scalp hematoma. CT CERVICAL SPINE FINDINGS Alignment: Normal. Skull base and vertebrae: No acute fracture. No  primary bone lesion or focal pathologic process. Soft tissues and spinal canal: No prevertebral fluid or swelling. No visible canal hematoma. Disc levels: Multilevel degenerative disc disease, most evident at C5-C6, C6-C7 and C7-T1. Moderate multilevel facet arthropathy bilaterally. Upper chest: Unremarkable. Other: None. IMPRESSION: 1. Large right frontotemporal scalp hematoma. No underlying displaced skull fracture or signs of significant acute traumatic injury to the brain. The appearance  of the brain is normal. 2. No evidence of significant acute traumatic injury to the cervical spine. 3. Multilevel degenerative disc disease and cervical spondylosis, as above. Electronically Signed   By: Vinnie Langton M.D.   On: 02/03/2022 06:29    Procedures Procedures    Medications Ordered in ED Medications - No data to display  ED Course/ Medical Decision Making/ A&P                           Medical Decision Making Amount and/or Complexity of Data Reviewed Independent Historian: spouse External Data Reviewed: ECG and notes. Labs: ordered. Decision-making details documented in ED Course. Radiology: ordered and independent interpretation performed. Decision-making details documented in ED Course. ECG/medicine tests: ordered and independent interpretation performed. Decision-making details documented in ED Course.   Presents to the emergency department after a fall.  Patient was initially thought to be on blood thinners, level 2 trauma was activated.  Upon further review, it was found that he is not on blood thinners, trauma activation was canceled.  Differential diagnosis for this patient includes head injury including intracranial hemorrhage, concussion, stroke, syncope, encephalopathy.  Patient underwent CT head and cervical spine.  CT head shows hematoma but no intracranial or skull injury.  Cervical spine does not show acute fracture.  His neck pain is diffuse and chronic for him.  It is noted  in his records that he has chronic pain and is chronically on opiates for pain control.  This may have played a role in his altered mental status earlier today.  Will perform remainder of metabolic work-up.  Patient without any focal findings to suggest acute stroke.  Will sign with oncoming ER physician to follow results and disposition patient.        Final Clinical Impression(s) / ED Diagnoses Final diagnoses:  Injury of head, initial encounter    Rx / DC Orders ED Discharge Orders     None         Lynnsey Barbara, Gwenyth Allegra, MD 02/03/22 [phone]

## 2022-02-03 NOTE — Discharge Instructions (Signed)
You are seen in the emergency room today after fall with head injury.  I suspect that your mild confusion and amnesia to the event is likely related to a concussion.  If you develop any new or suddenly worsening symptoms such as worsening confusion, weakness/numbness on one side of your body, vision changes, speech changes, difficulty swallowing you should call 911 and return to the emergency department immediately.   I suspect that some of your confusion may be related to a concussion and I have a referral for you to see a neurologist for further evaluation.  Please also call your primary care physician for close follow-up appointment.

## 2022-02-04 ENCOUNTER — Encounter: Payer: Self-pay | Admitting: Family Medicine

## 2022-02-05 ENCOUNTER — Telehealth: Payer: Self-pay | Admitting: General Practice

## 2022-02-05 MED ORDER — ZOLPIDEM TARTRATE 10 MG PO TABS: 5.0000 mg | ORAL_TABLET | Freq: Every evening | ORAL | 0 refills | Status: DC | PRN

## 2022-02-05 MED ORDER — MOMETASONE FUROATE 0.1 % EX CREA: TOPICAL_CREAM | CUTANEOUS | 0 refills | Status: AC

## 2022-02-05 NOTE — Telephone Encounter (Signed)
Transition Care Management Follow-up Telephone Call Date of discharge and from where: 02/03/22 from [facility] How have you been since you were released from the hospital? He had a fall. He is doing ok; other than some bruises he is doing ok. Any questions or concerns? No  Items Reviewed: Did the pt receive and understand the discharge instructions provided? Yes  Medications obtained and verified? No  Other? No  Any new allergies since your discharge? No  Dietary orders reviewed? Yes Do you have support at home? Yes   Home Care and Equipment/Supplies: Were home health services ordered? no  Functional Questionnaire: (I = Independent and D = Dependent) ADLs: I  Bathing/Dressing- I  Meal Prep- I  Eating- I  Maintaining continence- I  Transferring/Ambulation- I  Managing Meds- I  Follow up appointments reviewed:  PCP Hospital f/u appt confirmed? No  He said he will call back to schedule. [facility] f/u appt confirmed? No  Are transportation arrangements needed? No  If their condition worsens, is the pt aware to call PCP or go to the Emergency Dept.? Yes Was the patient provided with contact information for the PCP's office or ED? Yes Was to pt encouraged to call back with questions or concerns? Yes

## 2022-02-05 NOTE — Telephone Encounter (Signed)
Rxs pended.  Zolpidem  Last OV: 01/31/22 Next OV: no appt scheduled Last RF: 07/15/21

## 2022-02-12 ENCOUNTER — Encounter: Payer: Self-pay | Admitting: Family Medicine

## 2022-02-13 MED ORDER — METHOCARBAMOL 750 MG PO TABS: 750.0000 mg | ORAL_TABLET | Freq: Three times a day (TID) | ORAL | 0 refills | Status: DC | PRN

## 2022-02-24 ENCOUNTER — Other Ambulatory Visit: Payer: Self-pay | Admitting: Family Medicine

## 2022-02-24 MED ORDER — MELOXICAM 15 MG PO TABS: 15.0000 mg | ORAL_TABLET | Freq: Every day | ORAL | 0 refills | Status: DC | PRN

## 2022-02-24 MED ORDER — HYDROCODONE-ACETAMINOPHEN 5-325 MG PO TABS: 1.0000 | ORAL_TABLET | Freq: Four times a day (QID) | ORAL | 0 refills | Status: AC | PRN

## 2022-02-24 NOTE — Addendum Note (Signed)
Addended by: Narda Rutherford on: 02/24/2022 11:07 AM   Modules accepted: Orders

## 2022-02-24 NOTE — Telephone Encounter (Signed)
Rx signed.   Bryce King is the status of MRI approval?

## 2022-02-24 NOTE — Addendum Note (Signed)
Addended by: Perlie Mayo on: 02/24/2022 04:41 PM   Modules accepted: Orders

## 2022-02-26 NOTE — Telephone Encounter (Signed)
Insurance requested clinical notes, past/recent imaging results, treatment plan and med list for patient. Necessary information was faxed multiple time on 01/31/22 & 01/30/22.   Per Milus Height at [facility] that Nurse reviewer is currently reviewing the clinical notes today. Informed by [employer] a determination will be sent via fax. No definitive time frame was provided when Rep was prompted for a specific time when determination will be available.

## 2022-02-27 ENCOUNTER — Telehealth: Payer: Self-pay

## 2022-02-27 NOTE — Telephone Encounter (Signed)
Auth denied from insurance. Per insurance:  Patient has not completed 6 weeks of provider directed treatment. The provider directed treatment did not occur within the last three months.  Symptoms must be the same or worse after treatment to support imaging. There was no contact with your provider after completing treatment.  Provider can appeal at [phone]. Case/Ref # [id number].

## 2022-03-04 NOTE — Telephone Encounter (Signed)
MRI for the lumbar spine approved. Auth #[id number]. Valid from 01/31/22 through 07/30/22. Imaging dept has been notified to contact patient for scheduling.

## 2022-03-14 ENCOUNTER — Other Ambulatory Visit: Payer: Self-pay | Admitting: Family Medicine

## 2022-03-14 ENCOUNTER — Encounter: Payer: Self-pay | Admitting: Family Medicine

## 2022-03-14 MED ORDER — HYDROCODONE-ACETAMINOPHEN 5-325 MG PO TABS: 1.0000 | ORAL_TABLET | Freq: Four times a day (QID) | ORAL | 0 refills | Status: DC | PRN

## 2022-03-14 NOTE — Telephone Encounter (Signed)
Hydrocodone does not appear to be an active medication to pend. Please advise

## 2022-03-28 ENCOUNTER — Telehealth: Payer: Self-pay

## 2022-03-28 NOTE — Telephone Encounter (Signed)
Initiated Prior authorization IRJ:JOACZYS SureClick '140MG'$ /ML auto-injectors  Via: Covermymeds Case/Key:[id number] Status: Pending as of 03/28/22 Reason: Notified Pt via: Mychart

## 2022-04-03 ENCOUNTER — Other Ambulatory Visit: Payer: Self-pay

## 2022-04-03 ENCOUNTER — Other Ambulatory Visit: Payer: Self-pay | Admitting: Family Medicine

## 2022-04-03 MED ORDER — MELOXICAM 15 MG PO TABS: 15.0000 mg | ORAL_TABLET | Freq: Every day | ORAL | 0 refills | Status: DC | PRN

## 2022-04-03 MED ORDER — HYDROCODONE-ACETAMINOPHEN 5-325 MG PO TABS: 1.0000 | ORAL_TABLET | Freq: Three times a day (TID) | ORAL | 0 refills | Status: DC | PRN

## 2022-04-06 ENCOUNTER — Ambulatory Visit (INDEPENDENT_AMBULATORY_CARE_PROVIDER_SITE_OTHER): Payer: Managed Care, Other (non HMO)

## 2022-04-06 DIAGNOSIS — M5416 Radiculopathy, lumbar region: Secondary | ICD-10-CM

## 2022-04-06 DIAGNOSIS — G8929 Other chronic pain: Secondary | ICD-10-CM

## 2022-04-06 DIAGNOSIS — M545 Low back pain, unspecified: Secondary | ICD-10-CM

## 2022-04-11 ENCOUNTER — Other Ambulatory Visit: Payer: Self-pay | Admitting: Family Medicine

## 2022-04-11 DIAGNOSIS — M5416 Radiculopathy, lumbar region: Secondary | ICD-10-CM

## 2022-04-11 DIAGNOSIS — M5137 Other intervertebral disc degeneration, lumbosacral region: Secondary | ICD-10-CM

## 2022-04-11 DIAGNOSIS — M5412 Radiculopathy, cervical region: Secondary | ICD-10-CM

## 2022-04-14 ENCOUNTER — Encounter: Payer: Self-pay | Admitting: Family Medicine

## 2022-04-14 ENCOUNTER — Other Ambulatory Visit: Payer: Self-pay | Admitting: Family Medicine

## 2022-04-22 ENCOUNTER — Encounter: Payer: Self-pay | Admitting: Family Medicine

## 2022-04-22 MED ORDER — HYDROCODONE-ACETAMINOPHEN 5-325 MG PO TABS: 1.0000 | ORAL_TABLET | Freq: Three times a day (TID) | ORAL | 0 refills | Status: DC | PRN

## 2022-04-22 NOTE — Telephone Encounter (Signed)
Completed

## 2022-04-28 ENCOUNTER — Other Ambulatory Visit: Payer: Self-pay | Admitting: Family Medicine

## 2022-05-13 MED ORDER — HYDROCODONE-ACETAMINOPHEN 5-325 MG PO TABS: 1.0000 | ORAL_TABLET | Freq: Three times a day (TID) | ORAL | 0 refills | Status: DC | PRN

## 2022-05-13 NOTE — Addendum Note (Signed)
Addended by: Narda Rutherford on: 05/13/2022 09:25 AM   Modules accepted: Orders

## 2022-05-13 NOTE — Addendum Note (Signed)
Addended by: Perlie Mayo on: 05/13/2022 05:14 PM   Modules accepted: Orders

## 2022-05-25 ENCOUNTER — Other Ambulatory Visit: Payer: Self-pay | Admitting: Family Medicine

## 2022-06-03 MED ORDER — HYDROCODONE-ACETAMINOPHEN 5-325 MG PO TABS: 1.0000 | ORAL_TABLET | Freq: Three times a day (TID) | ORAL | 0 refills | Status: DC | PRN

## 2022-06-03 NOTE — Addendum Note (Signed)
Addended by: Perlie Mayo on: 06/03/2022 12:14 PM   Modules accepted: Orders

## 2022-06-03 NOTE — Addendum Note (Signed)
Addended by: Narda Rutherford on: 06/03/2022 08:58 AM   Modules accepted: Orders

## 2022-06-23 ENCOUNTER — Other Ambulatory Visit: Payer: Self-pay

## 2022-06-23 MED ORDER — MELOXICAM 15 MG PO TABS: 15.0000 mg | ORAL_TABLET | Freq: Every day | ORAL | 1 refills | Status: DC

## 2022-06-23 NOTE — Telephone Encounter (Signed)
If this is going to be a chronic medication that he is needing to take daily I need to see him to discuss.  Thanks!  CM

## 2022-06-23 NOTE — Addendum Note (Signed)
Addended by: Narda Rutherford on: 06/23/2022 01:07 PM   Modules accepted: Orders

## 2022-06-26 ENCOUNTER — Other Ambulatory Visit: Payer: Self-pay | Admitting: Family Medicine

## 2022-07-04 ENCOUNTER — Encounter: Payer: Self-pay | Admitting: Family Medicine

## 2022-07-04 ENCOUNTER — Ambulatory Visit (INDEPENDENT_AMBULATORY_CARE_PROVIDER_SITE_OTHER): Payer: Managed Care, Other (non HMO) | Admitting: Family Medicine

## 2022-07-04 VITALS — BP 114/64 | HR 66 | Ht 72.0 in | Wt 230.0 lb

## 2022-07-04 DIAGNOSIS — E7800 Pure hypercholesterolemia, unspecified: Secondary | ICD-10-CM

## 2022-07-04 DIAGNOSIS — M47816 Spondylosis without myelopathy or radiculopathy, lumbar region: Secondary | ICD-10-CM

## 2022-07-04 MED ORDER — REPATHA SURECLICK 140 MG/ML ~~LOC~~ SOAJ: SUBCUTANEOUS | 3 refills | Status: AC

## 2022-07-04 MED ORDER — HYDROCODONE-ACETAMINOPHEN 5-325 MG PO TABS: 1.0000 | ORAL_TABLET | Freq: Three times a day (TID) | ORAL | 0 refills | Status: DC | PRN

## 2022-07-04 NOTE — Assessment & Plan Note (Signed)
He has done well with medial branch blocks and has upcoming ablation.  Will continue pain medication until he has ablation.  If needing longer than this he will need to sign pain contract and have UDS.

## 2022-07-04 NOTE — Assessment & Plan Note (Signed)
Doing well with repatha.  Rx renewed.

## 2022-07-04 NOTE — Progress Notes (Signed)
Bryce King - 62 y.o. male MRN [id number]  Date of birth: 10/01/1960  Subjective Chief Complaint  Patient presents with   Back Pain    HPI Bryce HippsMichael Ray Haslem is a 62 y.o. male here today for follow up of neck and low back pain.  He has been taking norco as needed for pain control.  He has had facet injections with Dr. Laurian Brim'Toole and is planning on having RF ablation soon.  He has had >80% improvement with medial branch blocks.  He is still using norco to help with pain between injections.  He has not noted any significant side effects from this.  Dr. Laurian Brim'Toole has prescribed gabapentin and tizanidine.  These have not really been effective for him.    ROS:  A comprehensive ROS was completed and negative except as noted per HPI  Allergies  Allergen Reactions   Statins Other (See Comments)    Muscle aches    Past Medical History:  Diagnosis Date   Allergy    Arthritis    Hyperlipidemia    Hypertension    OSA (obstructive sleep apnea)    Uses CPAP   PVC's (premature ventricular contractions)     Past Surgical History:  Procedure Laterality Date   BICEPT TENODESIS Left 04/19/2020   Procedure: BICEPS TENODESIS;  Surgeon: Bjorn PippinVarkey, Dax T, MD;  Location: [facility];  Service: Orthopedics;  Laterality: Left;   ELBOW SURGERY Right 2005   REVERSE SHOULDER ARTHROPLASTY Right 08/10/2019   Procedure: REVERSE SHOULDER ARTHROPLASTY;  Surgeon: Bjorn PippinVarkey, Dax T, MD;  Location: [facility];  Service: Orthopedics;  Laterality: Right;   ROTATOR CUFF REPAIR Right 2007   SHOULDER ARTHROSCOPY WITH ROTATOR CUFF REPAIR AND SUBACROMIAL DECOMPRESSION Left 04/19/2020   Procedure: SHOULDER ARTHROSCOPY DEBRIDEMENT EXTENSIVE, DISTAL CLAVICULECTOMY WITH ROTATOR CUFF REPAIR WITH BICEPS TENODESIS;  Surgeon: Bjorn PippinVarkey, Dax T, MD;  Location: [facility];  Service: Orthopedics;  Laterality: Left;    Social History   Socioeconomic History   Marital status: Married    Spouse name: Not on file    Number of children: 1   Years of education: Not on file   Highest education level: Some college, no degree  Occupational History   Not on file  Tobacco Use   Smoking status: Every Day    Packs/day: 1    Types: Cigarettes   Smokeless tobacco: Never  Vaping Use   Vaping Use: Never used  Substance and Sexual Activity   Alcohol use: Yes    Comment: weekly, "07/21/18 2 drinks a week"    Drug use: Never   Sexual activity: Not on file  Other Topics Concern   Not on file  Social History Narrative   Lives with wife   Caffeine- coffee 2 cups, tea all day   Social Determinants of Health   Financial Resource Strain: Low Risk  (07/03/2022)   Overall Financial Resource Strain (CARDIA)    Difficulty of Paying Living Expenses: Not hard at all  Food Insecurity: No Food Insecurity (07/03/2022)   Hunger Vital Sign    Worried About Running Out of Food in the Last Year: Never true    Ran Out of Food in the Last Year: Never true  Transportation Needs: No Transportation Needs (07/03/2022)   PRAPARE - Administrator, Civil ServiceTransportation    Lack of Transportation (Medical): No    Lack of Transportation (Non-Medical): No  Physical Activity: Insufficiently Active (07/03/2022)   Exercise Vital Sign    Days of Exercise per Week: 2  days    Minutes of Exercise per Session: 50 min  Stress: Stress Concern Present (07/03/2022)   [employer] of Occupational Health - Occupational Stress Questionnaire    Feeling of Stress : To some extent  Social Connections: Moderately Isolated (07/03/2022)   Social Connection and Isolation Panel [NHANES]    Frequency of Communication with Friends and Family: More than three times a week    Frequency of Social Gatherings with Friends and Family: Once a week    Attends Religious Services: Never    Database administrator or Organizations: No    Attends Engineer, structural: Not on file    Marital Status: Married    Family History  Problem Relation Age of Onset   Cancer Father     Depression Father    Diabetes Father    Prostate cancer Father    COPD Maternal Grandmother    Heart attack Maternal Grandfather    Heart attack Paternal Grandfather    Colon cancer Neg Hx    Colon polyps Neg Hx    Esophageal cancer Neg Hx    Rectal cancer Neg Hx    Stomach cancer Neg Hx     Health Maintenance  Topic Date Due   Hepatitis C Screening  02/01/2023 (Originally 07/07/1979)   COVID-19 Vaccine (5 - 2023-24 season) 07/20/2023 (Originally 02/17/2022)   INFLUENZA VACCINE  10/30/2022   COLONOSCOPY (Pts 45-42yrs Insurance coverage will need to be confirmed)  01/25/2023   DTaP/Tdap/Td (2 - Td or Tdap) 08/03/2030   HIV Screening  Completed   Zoster Vaccines- Shingrix  Completed   HPV VACCINES  Aged Out     ----------------------------------------------------------------------------------------------------------------------------------------------------------------------------------------------------------------- Physical Exam BP 114/64 (BP Location: Left Arm, Patient Position: Sitting, Cuff Size: Large)   Pulse 66   Ht 6' (1.829 m)   Wt 230 lb (104.3 kg)   SpO2 96%   BMI 31.19 kg/m   Physical Exam Constitutional:      Appearance: Normal appearance.  HENT:     Head: Normocephalic and atraumatic.  Eyes:     General: No scleral icterus. Neurological:     Mental Status: He is alert.  Psychiatric:        Mood and Affect: Mood normal.        Behavior: Behavior normal.     ------------------------------------------------------------------------------------------------------------------------------------------------------------------------------------------------------------------- Assessment and Plan  Lumbar facet arthropathy He has done well with medial branch blocks and has upcoming ablation.  Will continue pain medication until he has ablation.  If needing longer than this he will need to sign pain contract and have UDS.    Hyperlipidemia Doing well with  repatha.  Rx renewed.    Meds ordered this encounter  Medications   Evolocumab (REPATHA SURECLICK) 140 MG/ML SOAJ    Sig: INJECT 140 MG INTO THE SKIN EVERY 14 DAYS    Dispense:  3 mL    Refill:  3   HYDROcodone-acetaminophen (NORCO) 5-325 MG tablet    Sig: Take 1 tablet by mouth every 8 (eight) hours as needed for moderate pain.    Dispense:  30 tablet    Refill:  0    No follow-ups on file.    This visit occurred during the SARS-CoV-2 public health emergency.  Safety protocols were in place, including screening questions prior to the visit, additional usage of staff PPE, and extensive cleaning of exam room while observing appropriate contact time as indicated for disinfecting solutions.

## 2022-07-16 ENCOUNTER — Other Ambulatory Visit: Payer: Self-pay | Admitting: Family Medicine

## 2022-07-17 ENCOUNTER — Other Ambulatory Visit: Payer: Self-pay | Admitting: Family Medicine

## 2022-07-17 MED ORDER — ZOLPIDEM TARTRATE 10 MG PO TABS: 5.0000 mg | ORAL_TABLET | Freq: Every evening | ORAL | 0 refills | Status: AC | PRN

## 2022-07-24 ENCOUNTER — Encounter: Payer: Self-pay | Admitting: Family Medicine

## 2022-07-24 NOTE — Telephone Encounter (Signed)
Patient requesting hydrocodone / acet 5/325 Last written 07/04/2022 Last OV 07/04/2022 No upcoming appt schld.

## 2022-07-27 MED ORDER — HYDROCODONE-ACETAMINOPHEN 5-325 MG PO TABS: 1.0000 | ORAL_TABLET | Freq: Three times a day (TID) | ORAL | 0 refills | Status: AC | PRN

## 2022-08-25 ENCOUNTER — Other Ambulatory Visit: Payer: Self-pay | Admitting: Family Medicine

## 2022-10-21 ENCOUNTER — Other Ambulatory Visit: Payer: Self-pay | Admitting: Family Medicine

## 2022-10-22 ENCOUNTER — Telehealth: Payer: Self-pay | Admitting: General Practice

## 2022-10-22 NOTE — Transitions of Care (Post Inpatient/ED Visit) (Signed)
   10/22/2022  Name: Bryce King MRN: [id number] DOB: April 26, 1960  Today's TOC FU Call Status: Today's TOC FU Call Status:: Unsuccessul Call (1st Attempt) Unsuccessful Call (1st Attempt) Date: 10/22/22  Attempted to reach the patient regarding the most recent Inpatient/ED visit.  Follow Up Plan: Additional outreach attempts will be made to reach the patient to complete the Transitions of Care (Post Inpatient/ED visit) call.   Signature [place] Charon, Control and instrumentation engineer

## 2022-10-23 NOTE — Transitions of Care (Post Inpatient/ED Visit) (Signed)
   10/23/2022  Name: Bryce King MRN: [id number] DOB: 16-Jun-1960  Today's TOC FU Call Status: Today's TOC FU Call Status:: Unsuccessful Call (2nd Attempt) Unsuccessful Call (1st Attempt) Date: 10/22/22 Unsuccessful Call (2nd Attempt) Date: 10/23/22  Attempted to reach the patient regarding the most recent Inpatient/ED visit.  Follow Up Plan: Additional outreach attempts will be made to reach the patient to complete the Transitions of Care (Post Inpatient/ED visit) call.   Signature [place] Charon, Control and instrumentation engineer

## 2022-10-26 ENCOUNTER — Other Ambulatory Visit: Payer: Self-pay | Admitting: Family Medicine

## 2022-10-27 NOTE — Transitions of Care (Post Inpatient/ED Visit) (Signed)
   10/27/2022  Name: Bryce King MRN: [id number] DOB: 1960/07/15  Today's TOC FU Call Status: Today's TOC FU Call Status:: Unsuccessful Call (3rd Attempt) Unsuccessful Call (1st Attempt) Date: 10/22/22 Unsuccessful Call (2nd Attempt) Date: 10/23/22 Unsuccessful Call (3rd Attempt) Date: 10/27/22  Attempted to reach the patient regarding the most recent Inpatient/ED visit.  Follow Up Plan: No further outreach attempts will be made at this time. We have been unable to contact the patient.  Signature [place] Charon, Control and instrumentation engineer

## 2022-12-23 ENCOUNTER — Other Ambulatory Visit: Payer: Self-pay

## 2022-12-23 MED ORDER — LISINOPRIL 40 MG PO TABS: 40.0000 mg | ORAL_TABLET | Freq: Every day | ORAL | 0 refills | Status: AC

## 2022-12-23 MED ORDER — AMLODIPINE BESYLATE 10 MG PO TABS: 10.0000 mg | ORAL_TABLET | Freq: Every day | ORAL | 0 refills | Status: AC

## 2022-12-23 NOTE — Addendum Note (Signed)
Addended by: Anselm Lis A on: 12/23/2022 09:43 AM   Modules accepted: Orders

## 2022-12-29 ENCOUNTER — Other Ambulatory Visit: Payer: Self-pay | Admitting: Cardiology

## 2023-01-20 ENCOUNTER — Other Ambulatory Visit: Payer: Self-pay | Admitting: *Deleted

## 2023-01-20 MED ORDER — CARVEDILOL 6.25 MG PO TABS: 6.2500 mg | ORAL_TABLET | Freq: Two times a day (BID) | ORAL | 0 refills | Status: AC

## 2023-10-08 IMAGING — DX DG LUMBAR SPINE COMPLETE 4+V
5 series · 5 of 5 positions shown · non-contrast
Comparison: None.

CLINICAL DATA: Chronic low back pain.

EXAM:
LUMBAR SPINE - COMPLETE 4+ VIEW

[l-spine ap]
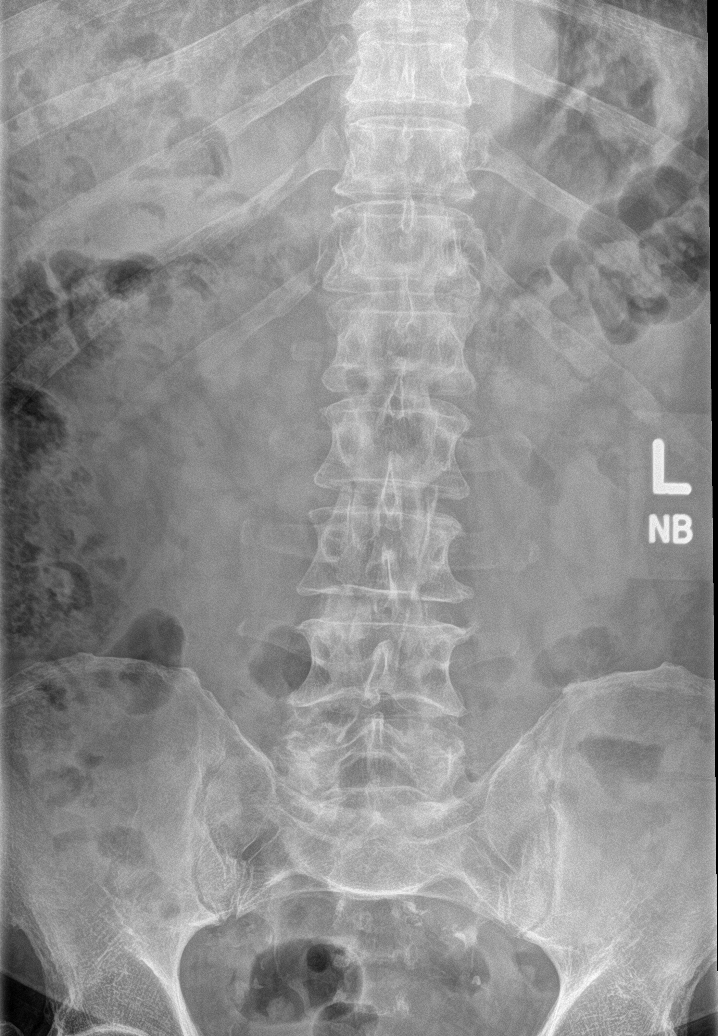

[l-spine obl (1 of 2)]
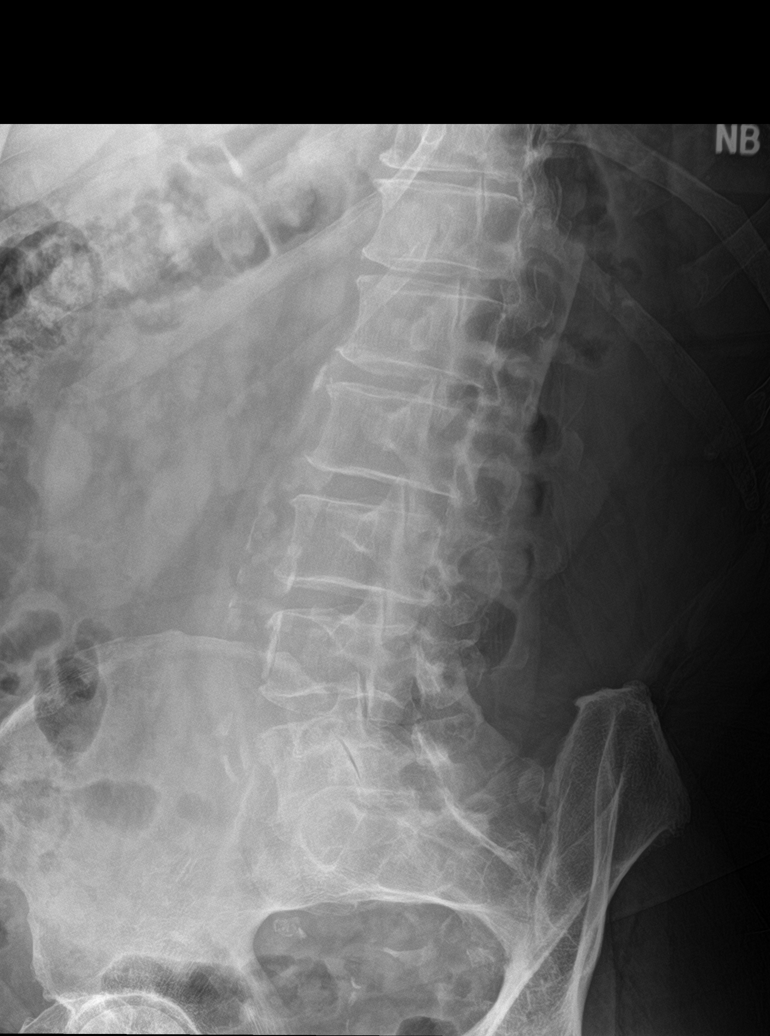

[l-spine obl (2 of 2)]
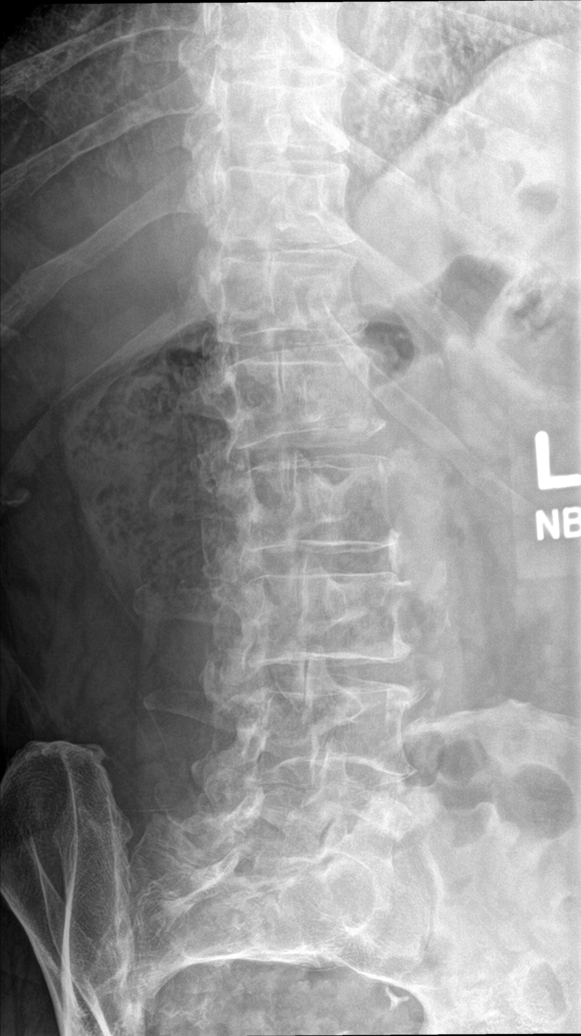

[l-spine lat]
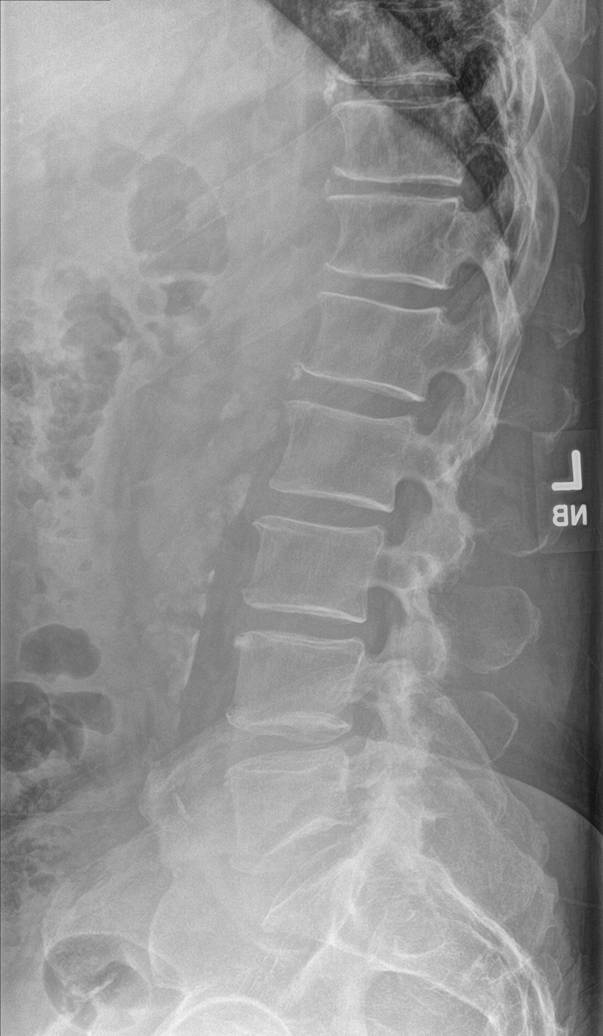

[l-spine spot]
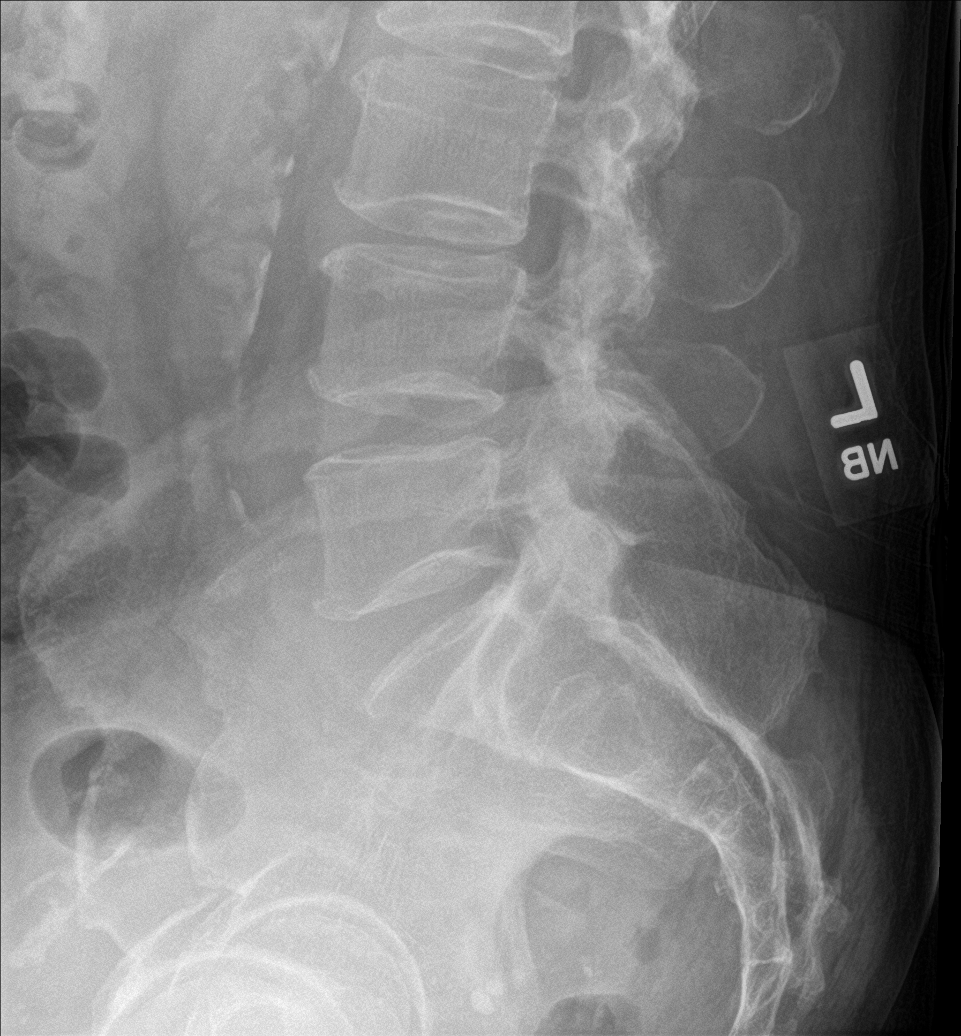

[5 of 5 positions shown; findings below may reference images not displayed]

FINDINGS: There are 5 non-rib-bearing lumbar-type vertebral bodies. Normal
alignment. The lumbar vertebral body heights are maintained. There
is mild endplate spurring throughout the lumbar spine without
significant disc space height loss appreciated. Mild facet
arthropathy at L5-S1 appears more pronounced on the right side.
Atherosclerotic disease of the abdominal aorta.
IMPRESSION: 1. Facet arthropathy at L5-S1 appears more pronounced on the right.
Other degenerative changes as described.
2. Atherosclerosis.
